# Patient Record
Sex: Male | Born: 1945 | Race: White | Hispanic: No | Marital: Married | Smoking: Current every day smoker
Health system: Southern US, Community
[De-identification: ages and names within clinical notes are randomized; demographics above are authoritative.]

## PROBLEM LIST (undated history)

## (undated) DIAGNOSIS — Z8719 Personal history of other diseases of the digestive system: Secondary | ICD-10-CM

## (undated) DIAGNOSIS — I2510 Atherosclerotic heart disease of native coronary artery without angina pectoris: Secondary | ICD-10-CM

## (undated) DIAGNOSIS — J42 Unspecified chronic bronchitis: Secondary | ICD-10-CM

## (undated) DIAGNOSIS — E785 Hyperlipidemia, unspecified: Secondary | ICD-10-CM

---
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
## 1969-05-26 HISTORY — PX: KNEE ARTHROSCOPY: SUR90

## 1998-06-30 ENCOUNTER — Encounter: Payer: Self-pay | Admitting: Emergency Medicine

## 1998-06-30 ENCOUNTER — Emergency Department (HOSPITAL_COMMUNITY): Admission: EM | Admit: 1998-06-30 | Discharge: 1998-06-30 | Payer: Self-pay | Admitting: Emergency Medicine

## 2000-03-09 ENCOUNTER — Ambulatory Visit (HOSPITAL_COMMUNITY): Admission: RE | Admit: 2000-03-09 | Discharge: 2000-03-09 | Payer: Self-pay | Admitting: General Surgery

## 2000-03-09 ENCOUNTER — Encounter: Payer: Self-pay | Admitting: General Surgery

## 2001-06-25 ENCOUNTER — Encounter: Payer: Self-pay | Admitting: General Surgery

## 2001-06-27 ENCOUNTER — Ambulatory Visit (HOSPITAL_COMMUNITY): Admission: RE | Admit: 2001-06-27 | Discharge: 2001-06-27 | Payer: Self-pay | Admitting: General Surgery

## 2001-06-27 ENCOUNTER — Encounter (INDEPENDENT_AMBULATORY_CARE_PROVIDER_SITE_OTHER): Payer: Self-pay | Admitting: Specialist

## 2004-01-28 ENCOUNTER — Encounter: Admission: RE | Admit: 2004-01-28 | Discharge: 2004-01-28 | Payer: Self-pay | Admitting: Family Medicine

## 2004-01-28 ENCOUNTER — Inpatient Hospital Stay (HOSPITAL_COMMUNITY): Admission: AD | Admit: 2004-01-28 | Discharge: 2004-01-30 | Payer: Self-pay | Admitting: Family Medicine

## 2004-11-24 ENCOUNTER — Emergency Department (HOSPITAL_COMMUNITY): Admission: EM | Admit: 2004-11-24 | Discharge: 2004-11-24 | Payer: Self-pay | Admitting: Emergency Medicine

## 2005-03-01 ENCOUNTER — Emergency Department (HOSPITAL_COMMUNITY): Admission: EM | Admit: 2005-03-01 | Discharge: 2005-03-01 | Payer: Self-pay | Admitting: Emergency Medicine

## 2006-03-15 ENCOUNTER — Ambulatory Visit: Payer: Self-pay | Admitting: Gastroenterology

## 2006-04-02 ENCOUNTER — Ambulatory Visit: Payer: Self-pay | Admitting: Family Medicine

## 2006-04-13 ENCOUNTER — Ambulatory Visit: Payer: Self-pay | Admitting: Family Medicine

## 2006-05-27 ENCOUNTER — Emergency Department (HOSPITAL_COMMUNITY): Admission: EM | Admit: 2006-05-27 | Discharge: 2006-05-27 | Payer: Self-pay | Admitting: Emergency Medicine

## 2006-06-07 ENCOUNTER — Ambulatory Visit: Payer: Self-pay | Admitting: Family Medicine

## 2006-06-07 ENCOUNTER — Encounter: Admission: RE | Admit: 2006-06-07 | Discharge: 2006-06-07 | Payer: Self-pay | Admitting: Family Medicine

## 2006-06-25 ENCOUNTER — Ambulatory Visit: Payer: Self-pay | Admitting: Family Medicine

## 2006-06-26 ENCOUNTER — Observation Stay (HOSPITAL_COMMUNITY): Admission: EM | Admit: 2006-06-26 | Discharge: 2006-06-28 | Payer: Self-pay | Admitting: Emergency Medicine

## 2006-06-26 ENCOUNTER — Ambulatory Visit: Payer: Self-pay | Admitting: Internal Medicine

## 2006-06-27 ENCOUNTER — Ambulatory Visit: Payer: Self-pay | Admitting: Internal Medicine

## 2006-07-10 ENCOUNTER — Ambulatory Visit: Payer: Self-pay | Admitting: Internal Medicine

## 2006-07-30 ENCOUNTER — Ambulatory Visit: Payer: Self-pay | Admitting: Family Medicine

## 2006-07-30 LAB — CONVERTED CEMR LAB
Chol/HDL Ratio, serum: 4.4
Cholesterol: 214 mg/dL (ref 0–200)
HDL: 48.9 mg/dL (ref >39.0)
LDL DIRECT: 150.1 mg/dL
Triglyceride fasting, serum: 55 mg/dL (ref 0–149)
VLDL: 11 mg/dL (ref 0–40)

## 2006-10-20 DIAGNOSIS — R030 Elevated blood-pressure reading, without diagnosis of hypertension: Secondary | ICD-10-CM

## 2006-10-20 DIAGNOSIS — IMO0001 Reserved for inherently not codable concepts without codable children: Secondary | ICD-10-CM | POA: Insufficient documentation

## 2007-06-29 ENCOUNTER — Emergency Department (HOSPITAL_COMMUNITY): Admission: EM | Admit: 2007-06-29 | Discharge: 2007-06-29 | Payer: Self-pay | Admitting: Emergency Medicine

## 2009-02-01 ENCOUNTER — Ambulatory Visit: Payer: Self-pay | Admitting: Family Medicine

## 2009-02-02 LAB — CONVERTED CEMR LAB: PSA: 0.75 ng/mL (ref 0.10–4.00)

## 2009-02-03 ENCOUNTER — Encounter (INDEPENDENT_AMBULATORY_CARE_PROVIDER_SITE_OTHER): Payer: Self-pay | Admitting: *Deleted

## 2009-02-03 LAB — CONVERTED CEMR LAB
ALT: 16 units/L (ref 0–53)
AST: 18 units/L (ref 0–37)
Albumin: 3.9 g/dL (ref 3.5–5.2)
Alkaline Phosphatase: 79 units/L (ref 39–117)
BUN: 9 mg/dL (ref 6–23)
Basophils Absolute: 0 10*3/uL (ref 0.0–0.1)
Basophils Relative: 0.6 % (ref 0.0–3.0)
Bilirubin, Direct: 0 mg/dL (ref 0.0–0.3)
CO2: 31 meq/L (ref 19–32)
Calcium: 9 mg/dL (ref 8.4–10.5)
Chloride: 107 meq/L (ref 96–112)
Cholesterol: 184 mg/dL (ref 0–200)
Creatinine, Ser: 0.7 mg/dL (ref 0.4–1.5)
Eosinophils Absolute: 0.2 10*3/uL (ref 0.0–0.7)
Eosinophils Relative: 3.4 % (ref 0.0–5.0)
GFR calc non Af Amer: 121.25 mL/min (ref >60)
Glucose, Bld: 107 mg/dL — ABNORMAL HIGH (ref 70–99)
HCT: 47 % (ref 39.0–52.0)
HDL: 44.2 mg/dL (ref >39.00)
Hemoglobin: 16.2 g/dL (ref 13.0–17.0)
LDL Cholesterol: 128 mg/dL — ABNORMAL HIGH (ref 0–99)
Lymphocytes Relative: 28.9 % (ref 12.0–46.0)
Lymphs Abs: 1.4 10*3/uL (ref 0.7–4.0)
MCHC: 34.4 g/dL (ref 30.0–36.0)
MCV: 93.6 fL (ref 78.0–100.0)
Monocytes Absolute: 0.4 10*3/uL (ref 0.1–1.0)
Monocytes Relative: 7.8 % (ref 3.0–12.0)
Neutro Abs: 3 10*3/uL (ref 1.4–7.7)
Neutrophils Relative %: 59.3 % (ref 43.0–77.0)
Platelets: 155 10*3/uL (ref 150.0–400.0)
Potassium: 4.2 meq/L (ref 3.5–5.1)
RBC: 5.03 M/uL (ref 4.22–5.81)
RDW: 12.4 % (ref 11.5–14.6)
Sodium: 142 meq/L (ref 135–145)
TSH: 1.52 microintl units/mL (ref 0.35–5.50)
Total Bilirubin: 0.7 mg/dL (ref 0.3–1.2)
Total CHOL/HDL Ratio: 4
Total Protein: 6.7 g/dL (ref 6.0–8.3)
Triglycerides: 61 mg/dL (ref 0.0–149.0)
VLDL: 12.2 mg/dL (ref 0.0–40.0)
WBC: 5 10*3/uL (ref 4.5–10.5)

## 2009-02-05 ENCOUNTER — Telehealth (INDEPENDENT_AMBULATORY_CARE_PROVIDER_SITE_OTHER): Payer: Self-pay | Admitting: *Deleted

## 2009-02-05 ENCOUNTER — Ambulatory Visit: Payer: Self-pay | Admitting: Family Medicine

## 2009-02-05 LAB — CONVERTED CEMR LAB
OCCULT 1: NEGATIVE
OCCULT 2: NEGATIVE
OCCULT 3: POSITIVE

## 2009-02-23 ENCOUNTER — Ambulatory Visit: Payer: Self-pay | Admitting: Family Medicine

## 2009-02-23 ENCOUNTER — Encounter (INDEPENDENT_AMBULATORY_CARE_PROVIDER_SITE_OTHER): Payer: Self-pay | Admitting: *Deleted

## 2009-02-23 LAB — CONVERTED CEMR LAB
OCCULT 1: NEGATIVE
OCCULT 2: NEGATIVE
OCCULT 3: NEGATIVE

## 2009-07-27 ENCOUNTER — Telehealth (INDEPENDENT_AMBULATORY_CARE_PROVIDER_SITE_OTHER): Payer: Self-pay | Admitting: *Deleted

## 2009-10-26 ENCOUNTER — Ambulatory Visit: Payer: Self-pay | Admitting: Family Medicine

## 2009-10-26 DIAGNOSIS — Z87898 Personal history of other specified conditions: Secondary | ICD-10-CM | POA: Insufficient documentation

## 2009-10-26 DIAGNOSIS — N3941 Urge incontinence: Secondary | ICD-10-CM | POA: Insufficient documentation

## 2009-10-26 DIAGNOSIS — F528 Other sexual dysfunction not due to a substance or known physiological condition: Secondary | ICD-10-CM | POA: Insufficient documentation

## 2009-10-26 LAB — CONVERTED CEMR LAB
Bilirubin Urine: NEGATIVE
Blood in Urine, dipstick: NEGATIVE
Glucose, Urine, Semiquant: NEGATIVE
Ketones, urine, test strip: NEGATIVE
Nitrite: NEGATIVE
Protein, U semiquant: NEGATIVE
Specific Gravity, Urine: 1.015
Urobilinogen, UA: 0.2
WBC Urine, dipstick: NEGATIVE
pH: 6

## 2009-10-28 LAB — CONVERTED CEMR LAB
PSA: 1.87 ng/mL (ref 0.10–4.00)
Sex Hormone Binding: 51 nmol/L (ref 13–71)
Testosterone Free: 53.8 pg/mL (ref 47.0–244.0)
Testosterone-% Free: 1.5 % — ABNORMAL LOW (ref 1.6–2.9)
Testosterone: 357.74 ng/dL (ref 350–890)

## 2010-10-25 NOTE — Progress Notes (Signed)
Summary: DO NOT RELEASE RECORDS TO [facility]  Phone Note Call from Patient Call back at Home Phone [phone]   Caller: Patient Summary of Call: Pt called to say  he DOES NOT want his records sent to [facility], he wants to stay with [place]. He says he does not think he signed, but if they ask for records do not send. I informed pt if he did not sign records we cannot release. Initial call taken by: Kandice Hams,  July 27, 2009 10:08 AM

## 2010-10-25 NOTE — Assessment & Plan Note (Signed)
Summary: CPX & LAB.[facility]   Vital Signs:  Patient profile:   65 year old male Height:      71.25 inches Weight:      207.8 pounds BMI:     28.88 Pulse rate:   72 / minute BP sitting:   152 / 90  (right arm)  Vitals Entered By: Doristine Devoid (Feb 01, 2009 8:33 AM) CC: CPX AND LABS    History of Present Illness: 64 yo man here today for CPE.  hasn't been seen since 10/07.  pt w/out concerns.  1) HTN- BP elevated, hx of elevation.  not on meds.  had DOT physical and reports there were no limitations.  never on meds.  pt reports BP elevates when he's upset about his mom.  was prescribed meds in past but only took 1 pill- never took it again, developed severe allergic rxn to ACE/ARB.  2) Pulm nodule- found on CT in 10/07, pt refused followup according to chart but pt reports nodule 'cleared'.  3) Health Maintainence- colonoscopy (refused).    Preventive Screening-Counseling & Management     Alcohol drinks/day: 0     Smoking Status: current     Smoking Cessation Counseling: yes     Smoke Cessation Stage: contemplative     Packs/Day: 1.5     Year Started: 1963     Does Patient Exercise: yes     Type of exercise: biking, walking     Exercise (avg: min/session): <30     Times/week: 4      Sexual History:  currently monogamous.        Drug Use:  never.    Allergies (verified): 1)  ! * Lisinopril  Past History:  Past Medical History:    Hypertension    Pulmonary Nodule RLL-refused follow up    BPH  Past Surgical History:    Vascular surgery Right hand    Right knee surgery   Family History:    CAD-father MI, mother    HTN-father,mother    DM- 2 brothers,mother          Social History:    pt drives for [employer]    married, 1 son (lives locally)    Smoking Status:  current    Packs/Day:  1.5    Does Patient Exercise:  yes    Sexual History:  currently monogamous    Drug Use:  never  Review of Systems  The patient denies anorexia, fever, weight  loss, weight gain, vision loss, decreased hearing, hoarseness, chest pain, syncope, dyspnea on exertion, peripheral edema, prolonged cough, headaches, abdominal pain, melena, hematochezia, hematuria, suspicious skin lesions, depression, abnormal bleeding, enlarged lymph nodes, and testicular masses.    Physical Exam  General:  Well-developed,well-nourished,in no acute distress; alert,appropriate and cooperative throughout examination.  smells of cigarettes Head:  Normocephalic and atraumatic without obvious abnormalities. No apparent alopecia or balding. Eyes:  No corneal or conjunctival inflammation noted. EOMI. Perrla. Funduscopic exam benign, without hemorrhages, exudates or papilledema. Vision grossly normal. Ears:  External ear exam shows no significant lesions or deformities.  Otoscopic examination reveals clear canals, tympanic membranes are intact bilaterally without bulging, retraction, inflammation or discharge. Hearing is grossly normal bilaterally. Nose:  External nasal examination shows no deformity or inflammation. Nasal mucosa are pink and moist without lesions or exudates. Mouth:  Oral mucosa and oropharynx without lesions or exudates.  Teeth in good repair. Neck:  No deformities, masses, or tenderness noted. Lungs:  Normal respiratory effort, chest  expands symmetrically. Lungs are clear to auscultation, no crackles or wheezes. Heart:  Normal rate and regular rhythm. S1 and S2 normal without gallop, murmur, click, rub or other extra sounds. Abdomen:  Bowel sounds positive,abdomen soft and non-tender without masses, organomegaly or hernias noted. Rectal:  No external abnormalities noted. Normal sphincter tone. No rectal masses or tenderness. Genitalia:  Testes bilaterally descended without nodularity, tenderness or masses. No scrotal masses or lesions. No penis lesions or urethral discharge. Prostate:  Prostate gland firm and smooth, mild enlargement, no nodularity, tenderness, mass,  asymmetry or induration. Msk:  No deformity or scoliosis noted of thoracic or lumbar spine.   Pulses:  +2 carotid, radial, DP Extremities:  No clubbing, cyanosis, edema, or deformity noted with normal full range of motion of all joints.   Neurologic:  No cranial nerve deficits noted. Station and gait are normal. Plantar reflexes are down-going bilaterally. DTRs are symmetrical throughout. Sensory, motor and coordinative functions appear intact. Skin:  Intact without suspicious lesions or rashes Cervical Nodes:  No lymphadenopathy noted Axillary Nodes:  No palpable lymphadenopathy Psych:  pt w/ a lot of trouble answering questions or making decisions based on cognitive limitations  (8th grade education)   Impression & Recommendations:  Problem # 1:  HEALTHY ADULT MALE (ICD-V70.0) Assessment New pt's PE WNL.  labs as below.  discussed colonoscopy- pt refused.  will do stool cards.  EKG for baseline.  stressed importance of smoking cessation- pt thinking about it. Orders: Venipuncture (29562) TLB-Lipid Panel (80061-LIPID) TLB-BMP (Basic Metabolic Panel-BMET) (80048-METABOL) TLB-CBC Platelet - w/Differential (85025-CBCD) TLB-Hepatic/Liver Function Pnl (80076-HEPATIC) TLB-TSH (Thyroid Stimulating Hormone) (84443-TSH) EKG w/ Interpretation (93000) TLB-PSA (Prostate Specific Antigen) (84153-PSA)  Problem # 2:  HYPERTENSION (ICD-401.9) Assessment: Unchanged BP elevated but reports this is b/c he gets upset when he thinks about his mother and yesterday was mother's day.  doubt this is the only time pt's BP elevated.  pt to check BP outside of office when he is not upset and record these #s.  pt to call if persistantly elevated b/c pt likely needs medication.  refuses at this time.  will follow.  Patient Instructions: 1)  Please schedule a follow-up appointment in 6 months to recheck blood pressure. 2)  Check your blood pressure at home, the grocery store, pharmacy, etc at least once a week.   record these numbers.  if they are usually higher than 140/90- please call 3)  We will notify you of your labs 4)  Call with any questions or concerns 5)  STOP SMOKING! 6)  Have a great summer!   Tetanus/Td Immunization History:    Tetanus/Td # 1:  Historical (04/02/2006)

## 2010-10-25 NOTE — Assessment & Plan Note (Signed)
Summary: PT WANTS PROSTATE CHECKED/RH.........Jacob Ruiz   Vital Signs:  Patient profile:   65 year old male Weight:      210 pounds Temp:     97.6 degrees F oral Pulse rate:   80 / minute Pulse rhythm:   regular BP sitting:   122 / 80  (left arm) Cuff size:   regular  Vitals Entered By: Army Fossa CMA (October 26, 2009 2:57 PM) CC: Pt would like his prostate checked- only concern is sometimes he is unable to hold urination.    History of Present Illness: Pt here c/o urinary frequency, urgency and some incontinence.   He also c/o of some ED.  No other complaints.    Current Medications (verified): 1)  Flomax 0.4 Mg Caps (Tamsulosin Hcl) .Jacob Ruiz.. 1 By Mouth Once Daily 2)  Levitra 20 Mg Tabs (Vardenafil Hcl) .... As Directed  Allergies: 1)  ! * Lisinopril  Past History:  Past medical, surgical, family and social histories (including risk factors) reviewed for relevance to current acute and chronic problems.  Past Medical History: Reviewed history from 02/01/2009 and no changes required. Hypertension Pulmonary Nodule RLL-refused follow up BPH  Past Surgical History: Reviewed history from 02/01/2009 and no changes required. Vascular surgery Right hand Right knee surgery   Family History: Reviewed history from 02/01/2009 and no changes required. CAD-father MI, mother HTN-father,mother DM- 2 brothers,mother  Social History: Reviewed history from 02/01/2009 and no changes required. 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 Prostate:  no nodules, no asymmetry, no induration, and 1+ enlarged.   Cervical Nodes:  No lymphadenopathy noted Psych:  Cognition and judgment appear  intact. Alert and cooperative with normal attention span and concentration. No apparent delusions, illusions, hallucinations   Impression & Recommendations:  Problem # 1:  BENIGN PROSTATIC HYPERTROPHY, HX OF (ICD-V13.8) flomax  check psa  Problem # 2:  URGE INCONTINENCE (ICD-788.31)  Orders: Venipuncture (95621) TLB-PSA (Prostate Specific Antigen) (84153-PSA)  Complete Medication List: 1)  Flomax 0.4 Mg Caps (Tamsulosin hcl) .Jacob Ruiz.. 1 by mouth once daily 2)  Levitra 20 Mg Tabs (Vardenafil hcl) .... As directed  Other Orders: T- * Misc. Laboratory test [id number]) Prescriptions: FLOMAX 0.4 MG CAPS (TAMSULOSIN HCL) 1 by mouth once daily  #30 x 5   Entered and Authorized by:   Loreen Freud DO   Signed by:   Loreen Freud DO on 10/26/2009   Method used:   Print then Give to Patient   RxID:   [id number]   Laboratory Results   Urine Tests    Routine Urinalysis   Color: yellow Appearance: Clear Glucose: negative   (Normal Range: Negative) Bilirubin: negative   (Normal Range: Negative) Ketone: negative   (Normal Range: Negative) Spec. Gravity: 1.015   (Normal Range: 1.003-1.035) Blood: negative   (Normal Range: Negative) pH: 6.0   (Normal Range: 5.0-8.0) Protein: negative   (Normal Range: Negative) Urobilinogen: 0.2   (Normal Range: 0-1) Nitrite: negative   (Normal Range: Negative) Leukocyte Esterace: negative   (Normal Range: Negative)    Comments: Army Fossa CMA  October 26, 2009 3:03 PM

## 2010-10-25 NOTE — Letter (Signed)
Summary: Results Follow up Letter  [facility] [place]  [address] [place], [place] [address]   Phone: [phone]  Fax: [phone]    02/03/2009 MRN: [id number]  Kache Dittmar [address] [place], [place]  [address]  Dear Mr. Yuille,  The following are the results of your recent test(s):  Test         Result    Pap Smear:        Normal _____  Not Normal _____ Comments: ______________________________________________________ Cholesterol: LDL(Bad cholesterol):         Your goal is less than:         HDL (Good cholesterol):       Your goal is more than: Comments:  ______________________________________________________ Mammogram:        Normal _____  Not Normal _____ Comments:  ___________________________________________________________________ Hemoccult:        Normal _____  Not normal _______ Comments:    _____________________________________________________________________ Other Tests: PLEASE SEE COPY OF LABS FROM 02/01/09- labs WNL- pt should continue to focus on diet and exercise to keep cholesterol at goal, normal- rescreen in 1 yr      We routinely do not discuss normal results over the telephone.  If you desire a copy of the results, or you have any questions about this information we can discuss them at your next office visit.   Sincerely,

## 2010-10-25 NOTE — Progress Notes (Signed)
Summary: stool cards  Phone Note Outgoing Call Call back at [phone]   Call placed by: Doristine Devoid,  Feb 05, 2009 4:50 PM Call placed to: Patient Summary of Call: spoke with patient informed that stool cards positve says he ate something that "messed his stomach up"  and wanted to redo cards. Per Dr. Beverely Low ok for patient to redo stool cards informed he is to have nothing to eat or drink thats red or any red meats but let patient know that if stool cards positive again he will be sent to GI doc patient agreed.............Marland KitchenDoristine Devoid  Feb 05, 2009 4:52 PM

## 2010-10-25 NOTE — Letter (Signed)
Summary: Proof of Physical Letter  Proof of Physical Letter   Imported By: Lanelle Bal 02/03/2009 15:02:45  _____________________________________________________________________  External Attachment:    Type:   Image     Comment:   External Document

## 2010-10-25 NOTE — Letter (Signed)
Summary: Results Follow up Letter  [facility] [place]  [address] [place], [place] [address]   Phone: [phone]  Fax: [phone]    02/23/2009 MRN: [id number]  Jacob Ruiz [address] [place], [place]  [address]  Dear Mr. Petro,  The following are the results of your recent test(s):  Test         Result    Pap Smear:        Normal _____  Not Normal _____ Comments: ______________________________________________________ Cholesterol: LDL(Bad cholesterol):         Your goal is less than:         HDL (Good cholesterol):       Your goal is more than: Comments:  ______________________________________________________ Mammogram:        Normal _____  Not Normal _____ Comments:  ___________________________________________________________________ Hemoccult: STOOL CARDS NEG.  Normal __X___  Not normal _______ Comments:    _____________________________________________________________________ Other Tests:    We routinely do not discuss normal results over the telephone.  If you desire a copy of the results, or you have any questions about this information we can discuss them at your next office visit.   Sincerely,

## 2011-02-10 NOTE — H&P (Signed)
NAMEBERND, Jacob Ruiz               ACCOUNT NO.:  [id number]   MEDICAL RECORD NO.:  [id number]          PATIENT TYPE:  INP   LOCATION:  4705                         FACILITY:  [facility]   PHYSICIAN:  Willow Ora, MD           DATE OF BIRTH:  04-05-1946   DATE OF ADMISSION:  06/26/2006  DATE OF DISCHARGE:                                HISTORY & PHYSICAL   CHIEF COMPLAINT:  Allergic reaction.   HISTORY OF PRESENT ILLNESS:  Mr. Pineo is a 65 year old white male who was  seen by his primary care doctor, Dr. Blossom Hoops, and he noticed that his  blood pressure was moderately elevated and he prescribed lisinopril 10 mg 1  p.o. daily.  The patient took his first dose last night at around 7 p.m.  Shortly after, he started to feel unwell but had no specific symptoms.  This  morning he woke up.  He was red all over.  He noticed swelling in his hands,  face, and feet but not at his tongue.  He felt short of breath and he also  had some difficulty breathing.  At the office, he was diagnosed with a  severe allergic reaction to lisinopril.  He has been admitted for further  care.  Before he left the hospital, he got Solu-Medrol 125 mg IM x1 after we  noticed his blood sugar to be 90.   PAST MEDICAL HISTORY:  1. The patient is a heavy smoker and apparently has some degree of      emphysema.  2. Moderate elevation of the blood pressure.   FAMILY HISTORY:  Positive for diabetes, CHF, colon cancer, and coronary  artery disease.   SOCIAL HISTORY:  The patient is married.  He smokes heavily.   REVIEW OF SYSTEMS:  Denies any fevers.  He did complain of chest pain that  feels like needles in the chest on and off for a few seconds.  Also he said  that his chest is tight like he has a vise on the chest.  He admits to  some cough but he had that before he started the ACE inhibitors.  He has  some nausea but no vomiting or diarrhea.  He does have a headache.   MEDICATIONS:  None on a routine basis.   ALLERGIES:  LISINOPRIL as described above.   PHYSICAL EXAMINATION:  GENERAL:  The patient is alert and oriented, in no  apparent distress.  LUNGS:  He has a few wheezes bilaterally but no increased work of breathing.  CARDIOVASCULAR:  Regular rate and rhythm without a murmur.  EXTREMITIES:  There is no pitting edema at the pretibial areas but the hands  definitely feel tight to touch.  SKIN:  He has generalized erythema more noticeable at the back.  Lips and  tongue:  Normal.   ASSESSMENT AND PLAN:  The patient is admitted to the hospital with a severe  LISINOPRIL reaction.  He will be monitored closely.  I will start him on  Solu-Medrol IV, Pepcid IV, and give him at least the first dose of  Benadryl  IV.  He is aware that if the symptoms get worse, he should alert the nurse  immediately.  I also wrote down from him the list of allergies that include  LISINOPRIL, ACE INHIBITORS, AND ARBs.      Willow Ora, MD  Electronically Signed     JP/MEDQ  D:  06/26/2006  T:  06/27/2006  Job:  [id number]

## 2011-02-10 NOTE — Discharge Summary (Signed)
NAMEROWYN, SPILDE               ACCOUNT NO.:  [id number]   MEDICAL RECORD NO.:  [id number]          PATIENT TYPE:  INP   LOCATION:  4705                         FACILITY:  [facility]   PHYSICIAN:  Jacob Ruiz, MDDATE OF BIRTH:  Mar 28, 1946   DATE OF ADMISSION:  06/26/2006  DATE OF DISCHARGE:  06/28/2006                                 DISCHARGE SUMMARY   DISCHARGE DIAGNOSES:  1. Anaphylaxis to ACE inhibitors.  2. Hypertension.  3. History of chronic obstructive pulmonary disease and tobacco abuse.  4. Rule our sinusitis.   HISTORY OF PRESENT ILLNESS:  [name] is a 65 year old white male who was  placed on lisinopril for the first time on the evening prior to admission.  He noted that he did not feel well, but had no specific complaints.  On the  morning of admission, he noticed shortness of breath, as well as redness and  swelling in his hands and face, as well as some shortness of breath.  He was  admitted for further evaluation and treatment.   COURSE OF HOSPITALIZATION:  1. Anaphylaxis to ACE inhibitor.  The patient was admitted and was placed      on IV steroids, as well as Pepcid and Benadryl.  His symptoms continued      to improve.  He is stable at time of discharge.  He will be sent home      on a prednisone taper, as well as p.o. Pepcid and p.r.n. Benadryl.  I      have reviewed, with the patient, the prednisone taper and reinforced      importance of completing medications as ordered.  However, patient      seems upset with need for home prescriptions and I am not sure that the      patient will be compliant; however, he has been educated on the      importance of taking these medications.  2. Hypertension.  The patient's blood pressure, at the time of discharge,      is 141/62 and he is currently off of all antihypertensives.  We will      defer further treatment to his primary care.  3. COPD.  The patient is currently at his baseline.  He is urged to quit    smoking.  4. Complaint of headache and sinus congestion.  Questionable sinusitis.      He will be treated with empiric Z-Pak.  5. The patient was maintained on telemetry during this admission.  He was      noted to have some bradycardia down into the 40s while sleeping, which      was asymptomatic and patient returned to heart rate in the 70s upon      waking.  He denies any history of syncope or dizziness.  We will defer      any further workup at this time.   CONDITION AT TIME OF DISCHARGE:  Medically improved.   FOLLOWUP:  The patient is instructed to follow up with Dr. Blossom Hoops, his  primary care [name] in 1 week and to contact their office  for an  appointment.  He is also instructed to return to the emergency room should  he develop swelling or shortness of breath.   MEDICATIONS AT DISCHARGE:  1. Z-Pak take as directed.  2. Prednisone taper 60 mg p.o. on October 4, 50 mg p.o. on October 5, 40      mg p.o. on October 6, 30 mg p.o. on October 7, 20 mg p.o. on October 8,      10 mg p.o. on October 9 and then discontinue.  3. Pepcid 20 mg p.o. b.i.d.  4. Benadryl 25 mg p.o. q.6 hours as needed.   DISPOSITION/PLAN:  Transfer patient to home.   FOLLOWUP:  The patient instructed to follow up with Dr. Blossom Hoops in 1 week.     ______________________________  Sandford Craze, NP      Raenette Rover. Felicity Coyer, MD  Electronically Signed    MO/MEDQ  D:  06/28/2006  T:  06/28/2006  Job:  [id number]   cc:   Leanne Chang, M.D.

## 2011-02-10 NOTE — Assessment & Plan Note (Signed)
[facility]                                   ON-CALL NOTE   NAME:Jacob Ruiz, Jacob Ruiz                        MRN:          [id number]  DATE:05/27/2006                            DOB:          February 25, 1946    OUTPATIENT ON CALL NOTE:  Date of interaction May 27, 2006 at 12:27  p.m.  Phone number is [phone].   OBJECTIVE:  Patient is light headed and had a complete physical July 8 and  was given an appointment in three months to check his blood pressure.  Yesterday he was light headed as he is today. Blood pressure today was  checked and was 180/84. He is not sure what his blood pressure was  yesterday.  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  History is  significant only for having a tooth pulled with wisdom teeth recently.  Was  on antibiotics for that.  Currently he is slightly light headed but feels  better than he does this morning.   ASSESSMENT:  Hypertension.  Presumably elevated and out-of-control.  Untreated with Lotensin as in the plan.  To the ER to be evaluated.   PRIMARY CARE Jacob Ruiz:  Dr[facility] Blossom [facility]ops at the [facility].                                   Arta Silence, MD   RNS/MedQ  DD:  05/27/2006  DT:  05/28/2006  Job #:  [id number]   cc:   Leanne Chang, M.D.

## 2011-02-10 NOTE — H&P (Signed)
NAME:  Jacob Ruiz, Jacob Ruiz                         ACCOUNT NO.:  [id number]   MEDICAL RECORD NO.:  [id number]                   PATIENT TYPE:  INP   LOCATION:  6736                                 FACILITY:  [facility]   PHYSICIAN:  Hollice Espy, M.D.            DATE OF BIRTH:  1946/03/20   DATE OF ADMISSION:  01/28/2004  DATE OF DISCHARGE:                                HISTORY & PHYSICAL   CHIEF COMPLAINT:  Shortness of breath.   HISTORY OF PRESENT ILLNESS:  This is a 65 year old white male with  essentially no past medical history other the knee surgery, who presents  with shortness of breath, pneumonia, and possible lung mass. The patient has  been relatively well with no complaints. He takes relatively no mediations  but over the past 5 days, has noticed increased problems with taking p.o.  He noted that anytime he would take food, that he would feel very nauseous  and usually throw it back up. If he tried liquids, he would be able to keep  that down but had lots of diarrhea. In addition, he noticed increasing  shortness of breath with intermittent productive cough, usually varying in  color from green to yellow with occasional hemoptysis. The patient felt  increasing shortness of breath with dyspnea on exertion. He complained of  occasional chest pain but was unsure if the chest pain, which usually  occurred after he ate food and felt like he had to throw it back up. The  patient otherwise had as an outpatient, tried p.r.n. Phenergan as well as  Zithromax with no evidence of improvement. Finally his primary care  physician, Dr. Blossom Hoops, felt like patient. The patient had a chest x-ray  done today, which showed evidence of a lingular pneumonia as well as  questionable bilateral basilar pneumonias. After his chest x-ray showed  evidence of a lingular pneumonia and also showed evidence of bilateral  basilar pneumonias versus possible lung mass, Dr. Blossom Hoops became concerned  and  had patient admitted as a direct admission for further work and  evaluation. Currently the patient states that he is feeling okay but he  feels quite thirsty and dehydrated. He has no current abdominal pain or  chest pain but he does report the history in the last few days. He has felt  slightly shortness of breath although better when he is resting. He denies  any hematuria or dysuria. He denies any constipation. The patient denies any  headaches or visual changes. He is a little anxious about the possible lung  mass. He denies any focal extremity pain or weakness although overall, he  does feel quite fatigued. He has never had anything like this before.   PAST MEDICAL HISTORY:  Bilateral knee surgeries.   MEDICATIONS:  The patient does not take any medications although recently  has taken a course of Zithromax and Phenergan.   ALLERGIES:  No known drug allergies.  SOCIAL HISTORY:  He is a greater than 40 to 50 pack year smoker. He quit  last Friday. He denies any heavy alcohol or drug use.   FAMILY HISTORY:  Extensive for diabetes, coronary artery disease,  hypertension.   PHYSICAL EXAMINATION:  VITAL SIGNS:  Currently not present although  according to his primary care physician, he was sating 94% to 95% on room  air. He is not tachycardiac or febrile.  GENERAL:  Alert and oriented. No apparent distress.  HEENT:  Normocephalic, atraumatic. He has no carotid bruits. His mucous  membranes are dry.  HEART:  Regular rate and rhythm. S1 and S2.  LUNGS:  He has decreased breath sounds at his bases.  ABDOMEN:  Soft, nontender, nondistended. He has hypoactive bowel sounds.  EXTREMITIES:  No clubbing, cyanosis, or edema. He has 1+ pulses.  NEUROLOGIC:  No focal neurological deficits.   LABORATORY DATA:  White count 4.6. Hemoglobin and hematocrit 14.7 and 42.  Platelet count 152,000. MCV of 88. He has 59% neutrophils, which is normal.  Of note, he has 13% monocytes which is slightly  elevated. His CPK, MB, chem  7 and LFT's are all pending. ABG shows pH of 7.47, pCO2 of 37, pO2 of only  75.8, bicarb of 27. This is on room air.   2-view of his chest shows a lingular pneumonia, masses or areas of  __________ pneumonia in both lungs and mild changes of COPD and chronic  bronchitis.   ASSESSMENT/PLAN:  1. Pneumonia with at least involvement of the lingula and possibly bilateral     bases. Will go ahead and give him oxygen and IV antibiotics for now. Will     continue to follow this.  2. Lung mass. Will be checking a CT of the chest. The patient is noted to     have a long time smoking history.  3. Diarrhea. I question the cause. This may be a gastroenteritis. Appears to     be too soon to be in response to antibiotics and the time frame is that     the diarrhea came first before the patient started antibiotics. Will go     ahead and give him p.r.n. Imodium for now for treatment.  4. Decreased p.o. with nausea and vomiting. Followup with CT of his chest     and abdominal x-ray to ensure that there is no etiology going on there.     Start him on clear liquids. If this seems to persist and is unrelated to     his lung problems, will ask GI to see him for possible EGD evaluation.                                                Hollice Espy, M.D.    SKK/MEDQ  D:  01/28/2004  T:  01/29/2004  Job:  [id number]   cc:   Leanne Chang, M.D.  [address]  [place]  [place] [address]  Fax: [phone]

## 2011-02-10 NOTE — Op Note (Signed)
[facility]  Patient:    Jacob Ruiz, Jacob Ruiz Visit Number: [id number] MRN: [id number]          Service Type: DSU Location: DAY Attending Physician:  Carson Myrtle Proc. Date: 06/27/01 Admit Date:  06/27/2001                             Operative Report  PREOPERATIVE DIAGNOSIS:  Recurrent perineal fistula.  POSTOPERATIVE DIAGNOSIS:  Recurrent perineal fistula.  OPERATIVE PROCEDURE:  Excision of fistulous tract.  SURGEON:  Timothy E. Earlene Plater, M.D.  ANESTHESIA:  General.  INDICATIONS:  Mr. 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  This has never been connected with the anus or rectum.  He has been seen and followed and treated in the office.  In spite of this, he has developed a recurrent fistula at the base of the scrotum in the midline of the perineum.  He wishes to have this repaired surgically.  It has been carefully discussed with the patient.  DESCRIPTION OF PROCEDURE:  The patient was taken to the operating room and placed supine.  General endotracheal anesthesia administered.  He was placed in lithotomy.  The perineum was prepped and draped and the scrotum elevated. The fistula began at the base of the scrotum in the midline and proceeded approximately 2.5 cm through the midline of the perineum and exited through a skin pore that was pustular.  This entire area was completely excised over a malleable probe.  The remaining subcutaneous tissue appeared normal.  The gaping wound by intention was approximately 2 cm wide and 2.5 cm long.  Its base was cauterized.  It was dry.  There were no complications.  A dressing was applied.  He tolerated it well, was awakened, and taken to the recovery room in good condition.  Written and verbal instructions were given including Percocet, and he will be followed as an outpatient. Attending Physician:  Carson Myrtle DD:  06/27/01 TD:  06/27/01 Job:  [id number]

## 2012-08-13 ENCOUNTER — Encounter (HOSPITAL_COMMUNITY): Payer: Self-pay | Admitting: *Deleted

## 2012-08-13 ENCOUNTER — Emergency Department (HOSPITAL_COMMUNITY)
Admission: EM | Admit: 2012-08-13 | Discharge: 2012-08-13 | Disposition: A | Discharge status: Home / Self Care | Payer: Medicare Other | Source: Home / Self Care | Attending: Emergency Medicine | Admitting: Emergency Medicine

## 2012-08-13 ENCOUNTER — Emergency Department (INDEPENDENT_AMBULATORY_CARE_PROVIDER_SITE_OTHER): Payer: Medicare Other

## 2012-08-13 DIAGNOSIS — R0789 Other chest pain: Secondary | ICD-10-CM

## 2012-08-13 DIAGNOSIS — R071 Chest pain on breathing: Secondary | ICD-10-CM

## 2012-08-13 MED ORDER — DICLOFENAC SODIUM 75 MG PO TBEC: 75.0000 mg | DELAYED_RELEASE_TABLET | Freq: Two times a day (BID) | ORAL | Status: DC

## 2012-08-13 MED ORDER — TRAMADOL HCL 50 MG PO TABS: 100.0000 mg | ORAL_TABLET | Freq: Three times a day (TID) | ORAL | Status: DC | PRN

## 2012-08-13 NOTE — ED Provider Notes (Signed)
Chief Complaint  Patient presents with  . Chest Pain    History of Present Illness:   The patient is a 66 year old male who was wrestling with his nephew about 7 weeks ago when he was elbowed in the right chest. He had some pain initially in the left anterolateral chest area, this gradually went away. Last night he thinks he may as left on his arm the wrong way and ever since he woke up this morning he's had more severe pain in that area, the anterolateral right chest. It hurts going over bumps, to touch, to cough, or to take a deep breath. Also reversed blistered bend. He denies any fever, chills, shortness of breath, or hemoptysis. He's had no anterior chest pain, palpitations, syncope, nausea, or diaphoresis. He denies any abdominal pain, nausea, or vomiting.  Review of Systems:  Other than noted above, the patient denies any of the following symptoms. Systemic:  No fever, chills, sweats, or fatigue. ENT:  No nasal congestion, rhinorrhea, or sore throat. Pulmonary:  No cough, wheezing, shortness of breath, sputum production, hemoptysis. Cardiac:  No palpitations, rapid heartbeat, dizziness, presyncope or syncope. GI:  No abdominal pain, heartburn, nausea, or vomiting. Ext:  No leg pain or swelling.  PMFSH:  Past medical history, family history, social history, meds, and allergies were reviewed and updated as needed.   Physical Exam:   Vital signs:  BP 166/72  Pulse 56  Temp 97.9 F (36.6 C) (Oral)  Resp 16  SpO2 99% Gen:  Alert, oriented, in no distress, skin warm and dry. Eye:  PERRL, lids and conjunctivas normal.  Sclera non-icteric. ENT:  Mucous membranes moist, pharynx clear. Neck:  Supple, no adenopathy or tenderness.  No JVD. Lungs:  Clear to auscultation, no wheezes, rales or rhonchi.  No respiratory distress. Heart:  Regular rhythm.  No gallops, murmers, clicks or rubs. Chest:  There is moderate chest wall tenderness in the right, lower, anterolateral chest area without  swelling, bruising, or deformity. Abdomen:  Soft, nontender, no organomegaly or mass.  Bowel sounds normal.  No pulsatile abdominal mass or bruit. Ext:  No edema.  No calf tenderness and Homann's sign negative.  Pulses full and equal. Skin:  Warm and dry.  No rash.   Radiology:  Dg Ribs Unilateral W/chest Right  08/13/2012  *RADIOLOGY REPORT*  Clinical Data: Chest pain  RIGHT RIBS AND CHEST - 3+ VIEW  Comparison: Chest x-ray 07/31/2011  Findings: COPD with hyperinflation of the lungs.  Negative for pneumonia.  Negative for infiltrate effusion or pneumothorax. Negative for mass lesion.  No evidence of right rib fracture.  IMPRESSION: COPD.  No acute cardiopulmonary disease.  Negative for right rib fracture.   Original Report Authenticated By: Janeece Riggers, M.D.    I reviewed the images independently and personally and concur with the radiologist's findings.  Assessment:  The encounter diagnosis was Chest wall pain.  Plan:   1.  The following meds were prescribed:   New Prescriptions   DICLOFENAC (VOLTAREN) 75 MG EC TABLET    Take 1 tablet (75 mg total) by mouth 2 (two) times daily.   TRAMADOL (ULTRAM) 50 MG TABLET    Take 2 tablets (100 mg total) by mouth every 8 (eight) hours as needed for pain.   2.  The patient was instructed in symptomatic care and handouts were given. 3.  The patient was told to return if becoming worse in any way, if no better in 3 or 4 days, and given some  red flag symptoms that would indicate earlier return.    Reuben Likes, MD 08/13/12 [id number]

## 2012-08-13 NOTE — ED Notes (Signed)
Pt reports he heard a snap on his left lateral rib area after picking up a couch 2 weeks ago.    That pain is better.  He also states he and his grandson were playing around several weeks ago and he has had pain in the lower right anterior rib area since then.  He denies feeling SOB.

## 2013-03-08 ENCOUNTER — Emergency Department (HOSPITAL_COMMUNITY)
Admission: EM | Admit: 2013-03-08 | Discharge: 2013-03-08 | Disposition: A | Discharge status: Home / Self Care | Payer: Medicare Other | Attending: Emergency Medicine | Admitting: Emergency Medicine

## 2013-03-08 ENCOUNTER — Encounter (HOSPITAL_COMMUNITY): Payer: Self-pay | Admitting: Emergency Medicine

## 2013-03-08 DIAGNOSIS — Y990 Civilian activity done for income or pay: Secondary | ICD-10-CM | POA: Insufficient documentation

## 2013-03-08 DIAGNOSIS — Y9289 Other specified places as the place of occurrence of the external cause: Secondary | ICD-10-CM | POA: Insufficient documentation

## 2013-03-08 DIAGNOSIS — IMO0002 Reserved for concepts with insufficient information to code with codable children: Secondary | ICD-10-CM | POA: Insufficient documentation

## 2013-03-08 DIAGNOSIS — Y9389 Activity, other specified: Secondary | ICD-10-CM | POA: Insufficient documentation

## 2013-03-08 DIAGNOSIS — S0191XA Laceration without foreign body of unspecified part of head, initial encounter: Secondary | ICD-10-CM

## 2013-03-08 DIAGNOSIS — Z23 Encounter for immunization: Secondary | ICD-10-CM | POA: Insufficient documentation

## 2013-03-08 DIAGNOSIS — S0100XA Unspecified open wound of scalp, initial encounter: Secondary | ICD-10-CM | POA: Insufficient documentation

## 2013-03-08 DIAGNOSIS — F17200 Nicotine dependence, unspecified, uncomplicated: Secondary | ICD-10-CM | POA: Insufficient documentation

## 2013-03-08 MED ORDER — HYDROCODONE-ACETAMINOPHEN 5-325 MG PO TABS: ORAL_TABLET | ORAL | Status: DC

## 2013-03-08 MED ORDER — ACETAMINOPHEN 325 MG PO TABS
650.0000 mg | ORAL_TABLET | Freq: Once | ORAL | Status: AC
  Administered 2013-03-08: 650 mg via ORAL
  Filled 2013-03-08: qty 2

## 2013-03-08 MED ORDER — TETANUS-DIPHTH-ACELL PERTUSSIS 5-2.5-18.5 LF-MCG/0.5 IM SUSP
0.5000 mL | Freq: Once | INTRAMUSCULAR | Status: AC
  Administered 2013-03-08: 0.5 mL via INTRAMUSCULAR
  Filled 2013-03-08: qty 0.5

## 2013-03-08 NOTE — ED Notes (Signed)
Beck, PA at bedside to repair scalp laceration.

## 2013-03-08 NOTE — ED Notes (Signed)
Pt was working outside and hit head on edge of building. Pt not on blood thinners, did not have LOC. Pt is A&O x4. Pt has approx 1 1/2 lac to top of head. Bleeding controlled. Pt in NAD

## 2013-03-08 NOTE — ED Provider Notes (Signed)
History     CSN: [id number]  Arrival date & time 03/08/13  1230   First MD Initiated Contact with Patient 03/08/13 1248      Chief Complaint  Patient presents with  . Head Laceration    (Consider location/radiation/quality/duration/timing/severity/associated sxs/prior treatment) HPI Comments: 67 y.o. Male with no significant medical hx presents today s/p acute onset head laceration PTA. Pt was working outside, bending, stood up, and banged his head on the sharp edge of the shed roof. Admits moderate pain where he bumped his head. Localized. Dull. Pt denies LOC, visual disturbances, headache, numbness, nausea, vomiting. Bleeding well controlled.   Tetanus unknown.   Patient is a 67 y.o. male presenting with scalp laceration.  Head Laceration Pertinent negatives include no chest pain, diaphoresis, fatigue, fever, headaches, nausea, neck pain, numbness, vertigo, visual change, vomiting or weakness.    History reviewed. No pertinent past medical history.  Past Surgical History  Procedure Laterality Date  . Knee arthroscopy Right     No family history on file.  History  Substance Use Topics  . Smoking status: Current Every Day Smoker -- 2.00 packs/day    Types: Cigarettes  . Smokeless tobacco: Not on file  . Alcohol Use: No      Review of Systems  Constitutional: Negative for fever, diaphoresis and fatigue.  HENT: Negative for neck pain and ear discharge.        Bump, laceration to right parietal  Eyes: Negative for visual disturbance.  Respiratory: Negative for shortness of breath.   Cardiovascular: Negative for chest pain.  Gastrointestinal: Negative for nausea and vomiting.  Musculoskeletal: Negative for gait problem.  Skin: Positive for wound.       1 1/2 inch laceration to right parietal  Neurological: Negative for vertigo, weakness, numbness and headaches.  Psychiatric/Behavioral: Negative for confusion.    Allergies  Lisinopril and Aleve  Home  Medications   Current Outpatient Rx  Name  Route  Sig  Dispense  Refill  . vitamin E 400 UNIT capsule   Oral   Take 400 Units by mouth daily.           BP 157/75  Pulse 56  Temp(Src) 98.2 F (36.8 C) (Oral)  Resp 18  SpO2 93%  Physical Exam  Nursing note and vitals reviewed. Constitutional: He is oriented to person, place, and time. He appears well-developed and well-nourished. No distress.  HENT:  Head: Normocephalic. Head is with contusion and with laceration.    Right Ear: External ear normal.  Left Ear: External ear normal.  Nose: Nose normal.  6 cm laceration to right parietal, minor bump, bleeding controlled  Eyes: Conjunctivae and EOM are normal. Pupils are equal, round, and reactive to light.  Neck: Normal range of motion. Neck supple.  No meningeal signs  Cardiovascular: Normal rate, regular rhythm and normal heart sounds.  Exam reveals no gallop and no friction rub.   No murmur heard. Pulmonary/Chest: Effort normal and breath sounds normal. No respiratory distress. He has no wheezes. He has no rales. He exhibits no tenderness.  Abdominal: Soft. Bowel sounds are normal. He exhibits no distension. There is no tenderness. There is no rebound and no guarding.  Musculoskeletal: Normal range of motion. He exhibits no edema and no tenderness.  FROM to upper and lower extremities No step-offs noted on C-spine No tenderness to palpation of the spinous processes of the C-spine, T-spine or L-spine  Neurological: He is alert and oriented to person, place, and time. No cranial  nerve deficit.  Speech is clear and goal oriented, follows commands Sensation normal to light touch  Moves extremities without ataxia, coordination intact Normal gait and balance Normal strength in upper and lower extremities bilaterally including dorsiflexion and plantar flexion, strong and equal grip strength   Skin: Skin is warm and dry. He is not diaphoretic. No erythema.  Psychiatric: He has a  normal mood and affect.    ED Course  Procedures (including critical care time) LACERATION REPAIR Performed by: Glade Nurse Authorized by: Glade Nurse Consent: Verbal consent obtained. Risks and benefits: risks, benefits and alternatives were discussed Consent given by: patient Patient identity confirmed: provided demographic data Prepped and Draped in normal sterile fashion Wound explored  Laceration Location: right parietal  Laceration Length: 6 cm  No Foreign Bodies seen or palpated  Anesthesia: local infiltration  Local anesthetic: pt refused  Anesthetic total: 0 ml  Irrigation method: syringe, 1000 cc Amount of cleaning: standard  Skin closure: staples  Number of sutures: 3  Technique: staples  Patient tolerance: Patient tolerated the procedure well with no immediate complications.  Labs Reviewed - No data to display No results found.   1. Laceration of head without complication, initial encounter        MDM  Tdap booster given. Wound cleaning complete with pressure irrigation, bottom of wound visualized, no foreign bodies appreciated. Laceration occurred < 8 hours prior to repair which was well tolerated. Pt has no co morbidities to effect normal wound healing. Discussed home care w pt and answered questions. Pt to f-u with PCP for wound check and staple removal in 5 days. Pt is hemodynamically stable w no complaints prior to dc.     Glade Nurse, PA-C 03/08/13 1515

## 2013-03-09 NOTE — ED Provider Notes (Signed)
Medical screening examination/treatment/procedure(s) were performed by non-physician practitioner and as supervising physician I was immediately available for consultation/collaboration.   Gwyneth Sprout, MD 03/09/13 1006

## 2014-03-26 ENCOUNTER — Observation Stay (HOSPITAL_COMMUNITY)
Admission: EM | Admit: 2014-03-26 | Discharge: 2014-03-28 | Disposition: A | Discharge status: Home / Self Care | Payer: Medicare Other | Attending: Emergency Medicine | Admitting: Emergency Medicine

## 2014-03-26 ENCOUNTER — Telehealth (HOSPITAL_BASED_OUTPATIENT_CLINIC_OR_DEPARTMENT_OTHER): Payer: Self-pay | Admitting: Emergency Medicine

## 2014-03-26 ENCOUNTER — Encounter (HOSPITAL_COMMUNITY): Payer: Self-pay | Admitting: Emergency Medicine

## 2014-03-26 ENCOUNTER — Emergency Department (HOSPITAL_COMMUNITY): Payer: Medicare Other

## 2014-03-26 DIAGNOSIS — Z79899 Other long term (current) drug therapy: Secondary | ICD-10-CM | POA: Insufficient documentation

## 2014-03-26 DIAGNOSIS — R001 Bradycardia, unspecified: Secondary | ICD-10-CM | POA: Diagnosis present

## 2014-03-26 DIAGNOSIS — R030 Elevated blood-pressure reading, without diagnosis of hypertension: Secondary | ICD-10-CM

## 2014-03-26 DIAGNOSIS — F17200 Nicotine dependence, unspecified, uncomplicated: Secondary | ICD-10-CM

## 2014-03-26 DIAGNOSIS — F528 Other sexual dysfunction not due to a substance or known physiological condition: Secondary | ICD-10-CM

## 2014-03-26 DIAGNOSIS — Z87898 Personal history of other specified conditions: Secondary | ICD-10-CM | POA: Diagnosis present

## 2014-03-26 DIAGNOSIS — I498 Other specified cardiac arrhythmias: Secondary | ICD-10-CM | POA: Insufficient documentation

## 2014-03-26 DIAGNOSIS — Z720 Tobacco use: Secondary | ICD-10-CM | POA: Diagnosis present

## 2014-03-26 DIAGNOSIS — N3941 Urge incontinence: Secondary | ICD-10-CM

## 2014-03-26 DIAGNOSIS — R42 Dizziness and giddiness: Principal | ICD-10-CM | POA: Diagnosis present

## 2014-03-26 DIAGNOSIS — IMO0001 Reserved for inherently not codable concepts without codable children: Secondary | ICD-10-CM | POA: Diagnosis present

## 2014-03-26 HISTORY — DX: Unspecified chronic bronchitis: J42

## 2014-03-26 HISTORY — DX: Personal history of other diseases of the digestive system: Z87.19

## 2014-03-26 LAB — URINALYSIS, ROUTINE W REFLEX MICROSCOPIC
Bilirubin Urine: NEGATIVE
Glucose, UA: NEGATIVE mg/dL
Hgb urine dipstick: NEGATIVE
Ketones, ur: NEGATIVE mg/dL
Leukocytes, UA: NEGATIVE
Nitrite: NEGATIVE
Protein, ur: NEGATIVE mg/dL
Specific Gravity, Urine: 1.023 (ref 1.005–1.030)
Urobilinogen, UA: 0.2 mg/dL (ref 0.0–1.0)
pH: 8 (ref 5.0–8.0)

## 2014-03-26 LAB — CBC
HCT: 43.9 % (ref 39.0–52.0)
Hemoglobin: 14.7 g/dL (ref 13.0–17.0)
MCH: 31 pg (ref 26.0–34.0)
MCHC: 33.5 g/dL (ref 30.0–36.0)
MCV: 92.6 fL (ref 78.0–100.0)
Platelets: 162 10*3/uL (ref 150–400)
RBC: 4.74 MIL/uL (ref 4.22–5.81)
RDW: 13 % (ref 11.5–15.5)
WBC: 7.5 10*3/uL (ref 4.0–10.5)

## 2014-03-26 LAB — CBC WITH DIFFERENTIAL/PLATELET
Basophils Absolute: 0 10*3/uL (ref 0.0–0.1)
Basophils Relative: 0 % (ref 0–1)
Eosinophils Absolute: 0.1 10*3/uL (ref 0.0–0.7)
Eosinophils Relative: 1 % (ref 0–5)
HCT: 46.5 % (ref 39.0–52.0)
Hemoglobin: 15.9 g/dL (ref 13.0–17.0)
Lymphocytes Relative: 13 % (ref 12–46)
Lymphs Abs: 1.3 10*3/uL (ref 0.7–4.0)
MCH: 31.7 pg (ref 26.0–34.0)
MCHC: 34.2 g/dL (ref 30.0–36.0)
MCV: 92.8 fL (ref 78.0–100.0)
Monocytes Absolute: 0.5 10*3/uL (ref 0.1–1.0)
Monocytes Relative: 5 % (ref 3–12)
Neutro Abs: 8 10*3/uL — ABNORMAL HIGH (ref 1.7–7.7)
Neutrophils Relative %: 81 % — ABNORMAL HIGH (ref 43–77)
Platelets: 164 10*3/uL (ref 150–400)
RBC: 5.01 MIL/uL (ref 4.22–5.81)
RDW: 13 % (ref 11.5–15.5)
WBC: 9.9 10*3/uL (ref 4.0–10.5)

## 2014-03-26 LAB — BASIC METABOLIC PANEL
Anion gap: 16 — ABNORMAL HIGH (ref 5–15)
BUN: 11 mg/dL (ref 6–23)
CO2: 25 mEq/L (ref 19–32)
Calcium: 9.4 mg/dL (ref 8.4–10.5)
Chloride: 100 mEq/L (ref 96–112)
Creatinine, Ser: 0.63 mg/dL (ref 0.50–1.35)
GFR calc Af Amer: >90 mL/min (ref >90)
GFR calc non Af Amer: >90 mL/min (ref >90)
Glucose, Bld: 106 mg/dL — ABNORMAL HIGH (ref 70–99)
Potassium: 4.4 mEq/L (ref 3.7–5.3)
Sodium: 141 mEq/L (ref 137–147)

## 2014-03-26 LAB — RAPID URINE DRUG SCREEN, HOSP PERFORMED
Amphetamines: NOT DETECTED
Barbiturates: NOT DETECTED
Benzodiazepines: NOT DETECTED
Cocaine: NOT DETECTED
Opiates: NOT DETECTED
Tetrahydrocannabinol: NOT DETECTED

## 2014-03-26 LAB — CREATININE, SERUM
Creatinine, Ser: 0.66 mg/dL (ref 0.50–1.35)
GFR calc Af Amer: >90 mL/min (ref >90)
GFR calc non Af Amer: >90 mL/min (ref >90)

## 2014-03-26 LAB — I-STAT CHEM 8, ED
BUN: 11 mg/dL (ref 6–23)
Calcium, Ion: 1.17 mmol/L (ref 1.13–1.30)
Chloride: 103 mEq/L (ref 96–112)
Creatinine, Ser: 0.7 mg/dL (ref 0.50–1.35)
Glucose, Bld: 105 mg/dL — ABNORMAL HIGH (ref 70–99)
HCT: 51 % (ref 39.0–52.0)
Hemoglobin: 17.3 g/dL — ABNORMAL HIGH (ref 13.0–17.0)
Potassium: 4.2 mEq/L (ref 3.7–5.3)
Sodium: 141 mEq/L (ref 137–147)
TCO2: 28 mmol/L (ref 0–100)

## 2014-03-26 LAB — PROTIME-INR
INR: 1.07 (ref 0.00–1.49)
Prothrombin Time: 13.9 seconds (ref 11.6–15.2)

## 2014-03-26 LAB — I-STAT TROPONIN, ED: Troponin i, poc: 0 ng/mL (ref 0.00–0.08)

## 2014-03-26 LAB — APTT: aPTT: 32 seconds (ref 24–37)

## 2014-03-26 LAB — ETHANOL: Alcohol, Ethyl (B): <11 mg/dL (ref 0–11)

## 2014-03-26 LAB — TSH: TSH: 0.819 u[IU]/mL (ref 0.350–4.500)

## 2014-03-26 MED ORDER — ONDANSETRON HCL 4 MG/2ML IJ SOLN: 4.0000 mg | Freq: Four times a day (QID) | INTRAMUSCULAR | Status: DC | PRN

## 2014-03-26 MED ORDER — ENOXAPARIN SODIUM 40 MG/0.4ML ~~LOC~~ SOLN
40.0000 mg | SUBCUTANEOUS | Status: DC
  Administered 2014-03-26 – 2014-03-27 (×2): 40 mg via SUBCUTANEOUS
  Filled 2014-03-26 (×3): qty 0.4

## 2014-03-26 MED ORDER — NICOTINE 21 MG/24HR TD PT24
21.0000 mg | MEDICATED_PATCH | Freq: Every day | TRANSDERMAL | Status: DC
  Administered 2014-03-26 – 2014-03-28 (×3): 21 mg via TRANSDERMAL
  Filled 2014-03-26 (×3): qty 1

## 2014-03-26 MED ORDER — ATROPINE SULFATE 0.1 MG/ML IJ SOLN: 0.5000 mg | INTRAMUSCULAR | Status: DC | PRN

## 2014-03-26 MED ORDER — MECLIZINE HCL 25 MG PO TABS
25.0000 mg | ORAL_TABLET | Freq: Three times a day (TID) | ORAL | Status: DC | PRN
  Filled 2014-03-26: qty 1

## 2014-03-26 MED ORDER — SODIUM CHLORIDE 0.9 % IJ SOLN
3.0000 mL | Freq: Two times a day (BID) | INTRAMUSCULAR | Status: DC
  Administered 2014-03-26 – 2014-03-28 (×3): 3 mL via INTRAVENOUS

## 2014-03-26 MED ORDER — ONDANSETRON HCL 4 MG PO TABS: 4.0000 mg | ORAL_TABLET | Freq: Four times a day (QID) | ORAL | Status: DC | PRN

## 2014-03-26 MED ORDER — SODIUM CHLORIDE 0.9 % IV SOLN
INTRAVENOUS | Status: DC
  Administered 2014-03-26: 21:00:00 via INTRAVENOUS

## 2014-03-26 MED ORDER — MECLIZINE HCL 25 MG PO TABS
25.0000 mg | ORAL_TABLET | ORAL | Status: AC
  Administered 2014-03-26: 25 mg via ORAL
  Filled 2014-03-26: qty 1

## 2014-03-26 NOTE — ED Notes (Signed)
Pt sent here by PCP. Last night began experiencing dizziness and felt like his head was spinning. sts the dizziness makes him nauseous. Denies HA. sts he has been getting some intermittent chest pain into left arm. Pt brady in the 40s at the doctor office.

## 2014-03-26 NOTE — ED Notes (Signed)
Dr. Wentz at the bedside.  

## 2014-03-26 NOTE — H&P (Signed)
[facility] History and Physical  JONQUEZ COURIER ZOX:[id number] DOB: 10-28-1945 DOA: 03/26/2014  Referring physician: Rolan Bucco, ER Physician PCP: Neena Rhymes, MD   Chief Complaint: Dizziness  HPI: Jacob Ruiz is a 68 y.o. male  With essentially no past medical history who went to bed last night and immediately felt like something was wrong. Whenever he would try to sit up or stand up, he felt the entire room spinning. The symptoms were so profound that he felt quite nauseated and threw up per family. Symptoms improved whenever he would lay back down.  Symptoms again persisted today leading to more vomiting. Patient came in to see his primary care physician and was then referred to the emergency room to rule out stroke. He was noted his primary care's office to have a heart rate in the 50s.  In the emergency room, CT scan head was unremarkable. Lab work was unremarkable. His heart rate was noted to be in the 40s. MRI was done which ruled out CVA. Hospitals were called for further evaluation and admission.  Review of Systems:  Patient seen after arrival to floor. Denies any headaches, vision changes, dysphagia, chest pain, palpitations, shortness of breath, wheeze, cough, abdominal pain, hematuria, dysuria, constipation, diarrhea.  No focal extremity numbness or weakness or pain. Patient's only complaint is that when he sits up he starts to feel like the room is spinning. This makes him quite nauseated. His review systems is otherwise negative  History reviewed. No pertinent past medical history. Past Surgical History  Procedure Laterality Date  . Knee arthroscopy Right    Social History:  reports that he has been smoking Cigarettes.  He has been smoking about 2.00 packs per day. He does not have any smokeless tobacco history on file. He reports that he does not drink alcohol or use illicit drugs.  lives at home with his wife. Does not use any assistance for activities of  daily living  Allergies  Allergen Reactions  . Lisinopril     REACTION: ANAPHYLAXIS  . Aleve [Naproxen Sodium] Rash    History reviewed. No pertinent family history.  Confirmed with patient.  Prior to Admission medications   Medication Sig Start Date End Date Taking? Authorizing Provider  vitamin E 400 UNIT capsule Take 400 Units by mouth daily.   Yes Historical Provider, MD   Physical Exam: Filed Vitals:   03/26/14 1830  BP: 158/69  Pulse: 44  Temp:   Resp: 17    BP 158/69  Pulse 44  Temp(Src) 98.1 F (36.7 C) (Oral)  Resp 17  Ht 5\' 11"  (1.803 m)  Wt 88.905 kg (196 lb)  BMI 27.35 kg/m2  SpO2 95%  General:  Alert and oriented x3, no acute distress Eyes: Sclera nonicteric, unable to elicit nystagmus  ENT:  normocephalic, atraumatic, mucous membranes are slightly dry  Neck: No JVD Cardiovascular: Reg rhythm, bradycardic Respiratory: CTA bilaterally Abdomen: s soft, nontender, nondistended, positive bowel sounds  Skin: no skin breaks, tears or lesions Musculoskeletal: no clubbing or cyanosis or edema  Psychiatric:  E. she is appropriate no evidence of psychoses or  Neurologic: No focal deficits, cranial nerves II through XII are intact.           Labs on Admission:  Basic Metabolic Panel:  Recent Labs Lab 03/26/14 1209 03/26/14 1236  NA 141 141  K 4.4 4.2  CL 100 103  CO2 25  --   GLUCOSE 106* 105*  BUN 11 11  CREATININE 0.63  0.70  CALCIUM 9.4  --    CBC:  Recent Labs Lab 03/26/14 1209 03/26/14 1236  WBC 9.9  --   NEUTROABS 8.0*  --   HGB 15.9 17.3*  HCT 46.5 51.0  MCV 92.8  --   PLT 164  --     Radiological Exams on Admission: Dg Chest 2 View  03/26/2014    IMPRESSION: No acute cardiopulmonary disease. Findings may represent component of mild COPD.   Electronically Signed   By: Salome HolmesHector  Cooper M.D.   On: 03/26/2014 12:32   Mr Brain Wo Contrast  03/26/2014    IMPRESSION: 1. No acute intracranial abnormality. 2. 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 The finding is nonspecific but can be seen in the setting of chronic microvascular ischemia, a demyelinating process such as multiple sclerosis, vasculitis, complicated migraine headaches, or as the sequelae of a prior infectious or inflammatory process. 3. No acute traumatic injury.   Electronically Signed   By: Gennette Pachris  Mattern M.D.   On: 03/26/2014 18:11    EKG: Independently reviewed. Noted bradycardia  Assessment/Plan Principal Problem:   Vertigo: Given stroke ruled out, this could be be something as simple as benign positional vertigo. We'll try Antivert. That said, symptoms seem to be worse whenever patient sits up or stands. This could indicate hypoperfusion brought on by bradycardia. Despite being volume depleted because of severe vomiting, his heart rate still only in the mid 40s. We'll try IV fluids and check TSH. When necessary atropine his heart rate gets below 40. If symptoms persist tomorrow and no better with Antivert, we'll have cardiology see patient  Active Problems:   Elevated blood pressure: Patient with no history formally of high blood pressure. It may be more elevated because of bradycardia or just dizziness. We'll continue to follow.    BENIGN PROSTATIC HYPERTROPHY, HX OF   Bradycardia: As above. Check TSH    Tobacco abuse: Nicotine patch    Code Status: Full code Family Communication: Wife & son at the bedside Disposition Plan: depending on resolution of symptoms and underlying cause, home possibly as early as tomorrow  Time spent: 35 minutes  Hollice EspyKRISHNAN,Hilding Quintanar K [facility] Pager [phone]  **Disclaimer: This note may have been dictated with voice recognition software. Similar sounding words can inadvertently be transcribed and this note may contain transcription errors which may not have been corrected upon publication of note.**

## 2014-03-26 NOTE — ED Notes (Signed)
Tim, NT transporting patient upstairs.

## 2014-03-26 NOTE — ED Provider Notes (Signed)
CSN: [id number]     Arrival date & time 03/26/14  1146 History   First MD Initiated Contact with Patient 03/26/14 1156     Chief Complaint  Patient presents with  . Dizziness  . Bradycardia     (Consider location/radiation/quality/duration/timing/severity/associated sxs/prior Treatment) HPI  Jacob Ruiz is a 68 y.o. male who suddenly developed dizziness, last night. The dizziness is episodic, and aggravated by movement from sitting to standing, and walking. He has a spinning sensation. He feels like he is walking like he is drunk. The dizziness causes him to vomit. He denies headache or blurred vision. He has had similar problems in the past. He went to his PCPs office today, and was sent here for further evaluation after being treated with Phenergan. He denies fever or chills, chest pain, cough, paresthesias, or change in bowels, and urinary habits. There are no other known modifying factors.   History reviewed. No pertinent past medical history. Past Surgical History  Procedure Laterality Date  . Knee arthroscopy Right    History reviewed. No pertinent family history. History  Substance Use Topics  . Smoking status: Current Every Day Smoker -- 2.00 packs/day    Types: Cigarettes  . Smokeless tobacco: Not on file  . Alcohol Use: No    Review of Systems  All other systems reviewed and are negative.     Allergies  Lisinopril and Aleve  Home Medications   Prior to Admission medications   Medication Sig Start Date End Date Taking? Authorizing Provider  vitamin E 400 UNIT capsule Take 400 Units by mouth daily.   Yes Historical Provider, MD   BP 150/66  Pulse 44  Temp(Src) 98.1 F (36.7 C) (Oral)  Resp 20  Ht 5\' 11"  (1.803 m)  Wt 196 lb (88.905 kg)  BMI 27.35 kg/m2  SpO2 98% Physical Exam  Nursing note and vitals reviewed. Constitutional: He is oriented to person, place, and time. He appears well-developed and well-nourished.  HENT:  Head: Normocephalic and  atraumatic.  Right Ear: External ear normal.  Left Ear: External ear normal.  Eyes: Conjunctivae and EOM are normal. Pupils are equal, round, and reactive to light.  Neck: Normal range of motion and phonation normal. Neck supple.  Cardiovascular: Normal rate, regular rhythm, normal heart sounds and intact distal pulses.   Pulmonary/Chest: Effort normal and breath sounds normal. He exhibits no bony tenderness.  Abdominal: Soft. There is no tenderness.  Musculoskeletal: Normal range of motion.  Neurological: He is alert and oriented to person, place, and time. No cranial nerve deficit or sensory deficit. He exhibits normal muscle tone. Coordination normal.  No nystagmus, dysarthria or aphasia. Negative Head Impulse testing, and negative Test of Skew. Mildly abnormal finger to nose testing on the left, normal finger to nose testing on right. Normal heel-to-shin, bilaterally.  Skin: Skin is warm, dry and intact.  Psychiatric: He has a normal mood and affect. His behavior is normal. Judgment and thought content normal.    ED Course  Procedures (including critical care time)  Patient's dizziness started greater than 14 hours ago. Therefore, he is not a candidate for thrombolysis.    Medications - No data to display  Patient Vitals for the past 24 hrs:  BP Temp Temp src Pulse Resp SpO2 Height Weight  03/26/14 1430 150/66 mmHg - - 44 20 98 % - -  03/26/14 1401 137/61 mmHg - - 47 20 96 % - -  03/26/14 1400 137/61 mmHg - - 52 19 99 % - -  03/26/14 1330 143/68 mmHg - - 49 15 96 % - -  03/26/14 1300 146/71 mmHg - - 43 13 98 % - -  03/26/14 1234 - 98.1 F (36.7 C) - - - - - -  03/26/14 1215 - - - 53 22 95 % - -  03/26/14 1150 131/65 mmHg 97.4 F (36.3 C) Oral 48 16 95 % 5\' 11"  (1.803 m) 196 lb (88.905 kg)   A brief discussion with the stroke neurologist, who recommended admitting the patient, whether or not the MRI indicates CVA    Labs Review Labs Reviewed  BASIC METABOLIC PANEL -  Abnormal; Notable for the following:    Glucose, Bld 106 (*)    Anion gap 16 (*)    All other components within normal limits  CBC WITH DIFFERENTIAL - Abnormal; Notable for the following:    Neutrophils Relative % 81 (*)    Neutro Abs 8.0 (*)    All other components within normal limits  I-STAT CHEM 8, ED - Abnormal; Notable for the following:    Glucose, Bld 105 (*)    Hemoglobin 17.3 (*)    All other components within normal limits  ETHANOL  PROTIME-INR  APTT  URINE RAPID DRUG SCREEN (HOSP PERFORMED)  URINALYSIS, ROUTINE W REFLEX MICROSCOPIC  CBC  I-STAT TROPOININ, ED  Rosezena Sensor, ED    Imaging Review Dg Chest 2 View  03/26/2014   CLINICAL DATA:  DIZZINESS BRADYCARDIA  EXAM: CHEST  2 VIEW  COMPARISON:  Rib series 11 05/2012.  FINDINGS: Cardiac silhouette within normal limits. Atherosclerotic calcifications within the aorta. No focal reason consolidation nor focal infiltrates. There is flattening in the hemidiaphragms mild increased AP diameter of the chest. No acute osseous abnormalities.  IMPRESSION: No acute cardiopulmonary disease. Findings may represent component of mild COPD.   Electronically Signed   By: Salome Holmes M.D.   On: 03/26/2014 12:32     EKG Interpretation   Date/Time:  Thursday March 26 2014 11:50:36 EDT Ventricular Rate:  48 PR Interval:  156 QRS Duration: 98 QT Interval:  476 QTC Calculation: 425 R Axis:   75 Text Interpretation:  Marked sinus bradycardia Septal infarct , age  undetermined Abnormal ECG Since last tracing rate faster Confirmed by  [facility]  MD, Myonna Chisom [phone]) on 03/26/2014 2:00:49 PM      MDM   Final diagnoses:  Dizziness    Nonspecific dizziness, without associated weakness, dysarthria, or aphasia. Symptoms are concerning for acute CVA.  Symptoms started greater than 14 hours prior to arriving in the emergency department. The patient did not meet criteria for thrombolysis.  Nursing Notes Reviewed/ Care Coordinated, and agree  without changes. Applicable Imaging Reviewed.  Interpretation of Laboratory Data incorporated into ED treatment  Plan: Admit    Flint Melter, MD 03/26/14 2036

## 2014-03-27 LAB — BASIC METABOLIC PANEL
Anion gap: 13 (ref 5–15)
BUN: 12 mg/dL (ref 6–23)
CO2: 26 mEq/L (ref 19–32)
Calcium: 8.8 mg/dL (ref 8.4–10.5)
Chloride: 100 mEq/L (ref 96–112)
Creatinine, Ser: 0.62 mg/dL (ref 0.50–1.35)
GFR calc Af Amer: >90 mL/min (ref >90)
GFR calc non Af Amer: >90 mL/min (ref >90)
Glucose, Bld: 112 mg/dL — ABNORMAL HIGH (ref 70–99)
Potassium: 4.2 mEq/L (ref 3.7–5.3)
Sodium: 139 mEq/L (ref 137–147)

## 2014-03-27 LAB — CBC
HCT: 43 % (ref 39.0–52.0)
Hemoglobin: 14.1 g/dL (ref 13.0–17.0)
MCH: 30.5 pg (ref 26.0–34.0)
MCHC: 32.8 g/dL (ref 30.0–36.0)
MCV: 93.1 fL (ref 78.0–100.0)
Platelets: 150 10*3/uL (ref 150–400)
RBC: 4.62 MIL/uL (ref 4.22–5.81)
RDW: 13.1 % (ref 11.5–15.5)
WBC: 7.1 10*3/uL (ref 4.0–10.5)

## 2014-03-27 MED ORDER — AZITHROMYCIN 500 MG PO TABS
500.0000 mg | ORAL_TABLET | Freq: Every day | ORAL | Status: DC
  Administered 2014-03-27 – 2014-03-28 (×2): 500 mg via ORAL
  Filled 2014-03-27 (×2): qty 1

## 2014-03-27 MED ORDER — HYDRALAZINE HCL 25 MG PO TABS
25.0000 mg | ORAL_TABLET | Freq: Two times a day (BID) | ORAL | Status: DC
  Administered 2014-03-27 (×2): 25 mg via ORAL
  Filled 2014-03-27 (×4): qty 1

## 2014-03-27 MED ORDER — CEFUROXIME AXETIL 500 MG PO TABS
500.0000 mg | ORAL_TABLET | Freq: Two times a day (BID) | ORAL | Status: DC
  Administered 2014-03-27 – 2014-03-28 (×3): 500 mg via ORAL
  Filled 2014-03-27 (×5): qty 1

## 2014-03-27 MED ORDER — ANTIPYRINE-BENZOCAINE 5.4-1.4 % OT SOLN
2.0000 [drp] | OTIC | Status: DC | PRN
  Filled 2014-03-27: qty 10

## 2014-03-27 MED ORDER — FLUTICASONE PROPIONATE 50 MCG/ACT NA SUSP
1.0000 | Freq: Every day | NASAL | Status: DC
Start: 2014-03-27 — End: 2014-03-28
  Administered 2014-03-27 – 2014-03-28 (×2): 1 via NASAL
  Filled 2014-03-27: qty 16

## 2014-03-27 MED ORDER — LORATADINE 10 MG PO TABS
10.0000 mg | ORAL_TABLET | Freq: Every day | ORAL | Status: DC
  Administered 2014-03-27 – 2014-03-28 (×2): 10 mg via ORAL
  Filled 2014-03-27 (×2): qty 1

## 2014-03-27 MED ORDER — MECLIZINE HCL 25 MG PO TABS
25.0000 mg | ORAL_TABLET | Freq: Three times a day (TID) | ORAL | Status: DC
  Administered 2014-03-27 – 2014-03-28 (×4): 25 mg via ORAL
  Filled 2014-03-27 (×6): qty 1

## 2014-03-27 NOTE — Progress Notes (Signed)
Patient ID: Jacob Ruiz  male  QAS:[id number]    DOB: [birth year]    DOA: 03/26/2014  PCP: Neldon Labella, MD  Assessment/Plan: Principal Problem:   Vertigo likely BPPV caused by peripheral vertigo from right ear infection, sinusitis, mild bronchitis - Patient feeling still having symptoms on standing up, Dix-Hallpike maneuver positive -Placed on meclizine, both ears examined, has possible right ear infection, patient noticed allergies, ears popping over the weekend before his symptoms started.  - Placed on a Zithromax and Ceftin, Claritin, Flonase spray - PT/OT for vestibular evaluation - MRI negative for acute stroke  Active Problems:   Elevated blood pressure - Placed on hydralazine 25 mg BID, due to sinus bradycardia, hold off on any nodal blocking drugs, allergic to ACE inhibitors  Sinus bradycardia - Reviewed on telemetry, currently heart rate 58, no AV blocks on EKG, continue to monitor likely due to vasovagal response from acute vertigo    Tobacco abuse - Continue nicotine patch, constipation on smoking cessation   DVT Prophylaxis:Lovenox  Code Status:  Family Communication:  Disposition:Hopefully tomorrow if symptoms are improved  Consultants:  None  Procedures:  None  Antibiotics:  Zithromax  Ceftin  Subjective: Patient seen and examined, symptoms slightly better from yesterday still having vertigo on positional changes   Objective: Weight change:   Intake/Output Summary (Last 24 hours) at 03/27/14 1003 Last data filed at 03/27/14 0900  Gross per 24 hour  Intake    360 ml  Output    950 ml  Net   -590 ml   Blood pressure 156/77, pulse 50, temperature 98.6 F (37 C), temperature source Oral, resp. rate 18, height 5\' 11"  (1.803 m), weight 84.505 kg (186 lb 4.8 oz), SpO2 97.00%.  Physical Exam: General: Alert and awake, oriented x3, not in any acute distress. HEENT: anicteric sclera, PERLA, EOMI, Dix-Hallpike maneuver positive, no nystagmus    CVS: S1-S2 clear, no murmur rubs or gallops Chest: clear to auscultation bilaterally, no wheezing, rales or rhonchi Abdomen: soft nontender, nondistended, normal bowel sounds  Extremities: no cyanosis, clubbing or edema noted bilaterally Neuro: Cranial nerves II-XII intact, no focal neurological deficits  Lab Results: Basic Metabolic Panel:  Recent Labs Lab 03/26/14 1209 03/26/14 1236 03/26/14 2005 03/27/14 0440  NA 141 141  --  139  K 4.4 4.2  --  4.2  CL 100 103  --  100  CO2 25  --   --  26  GLUCOSE 106* 105*  --  112*  BUN 11 11  --  12  CREATININE 0.63 0.70 0.66 0.62  CALCIUM 9.4  --   --  8.8   Liver Function Tests: No results found for this basename: AST, ALT, ALKPHOS, BILITOT, PROT, ALBUMIN,  in the last 168 hours No results found for this basename: LIPASE, AMYLASE,  in the last 168 hours No results found for this basename: AMMONIA,  in the last 168 hours CBC:  Recent Labs Lab 03/26/14 1209  03/26/14 2005 03/27/14 0440  WBC 9.9  --  7.5 7.1  NEUTROABS 8.0*  --   --   --   HGB 15.9  < > 14.7 14.1  HCT 46.5  < > 43.9 43.0  MCV 92.8  --  92.6 93.1  PLT 164  --  162 150  < > = values in this interval not displayed. Cardiac Enzymes: No results found for this basename: CKTOTAL, CKMB, CKMBINDEX, TROPONINI,  in the last 168 hours BNP: No components found with this  basename: POCBNP,  CBG: No results found for this basename: GLUCAP,  in the last 168 hours   Micro Results: No results found for this or any previous visit (from the past 240 hour(s)).  Studies/Results: Dg Chest 2 View  03/26/2014   CLINICAL DATA:  DIZZINESS BRADYCARDIA  EXAM: CHEST  2 VIEW  COMPARISON:  Rib series 11 05/2012.  FINDINGS: Cardiac silhouette within normal limits. Atherosclerotic calcifications within the aorta. No focal reason consolidation nor focal infiltrates. There is flattening in the hemidiaphragms mild increased AP diameter of the chest. No acute osseous abnormalities.   IMPRESSION: No acute cardiopulmonary disease. Findings may represent component of mild COPD.   Electronically Signed   By: Salome HolmesHector  Cooper M.D.   On: 03/26/2014 12:32   Mr Brain Wo Contrast  03/26/2014   CLINICAL DATA:  Vertigo over the last 2 days.  Blunt trauma to head.  EXAM: MRI HEAD WITHOUT CONTRAST  TECHNIQUE: Multiplanar, multiecho pulse sequences of the brain and surrounding structures were obtained without intravenous contrast.  COMPARISON:  CT head without contrast 05/27/2006  FINDINGS: The diffusion-weighted images demonstrate no evidence for acute or subacute infarction. No hemorrhage or mass lesion is present. The ventricles scratch the periventricular and subcortical white matter changes are noted bilaterally, mildly advanced for age.  Flow is present in the major intracranial arteries. The globes and orbits are intact. A polyp or mucous retention cyst is noted along the floor of the right maxillary sinus. The remaining paranasal sinuses and mastoid air cells are clear.  IMPRESSION: 1. No acute intracranial abnormality. 2. 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 The finding is nonspecific but can be seen in the setting of chronic microvascular ischemia, a demyelinating process such as multiple sclerosis, vasculitis, complicated migraine headaches, or as the sequelae of a prior infectious or inflammatory process. 3. No acute traumatic injury.   Electronically Signed   By: Gennette Pachris  Mattern M.D.   On: 03/26/2014 18:11    Medications: Scheduled Meds: . azithromycin  500 mg Oral Daily  . cefUROXime  500 mg Oral BID WC  . enoxaparin (LOVENOX) injection  40 mg Subcutaneous Q24H  . fluticasone  1 spray Each Nare Daily  . meclizine  25 mg Oral TID  . nicotine  21 mg Transdermal Daily  . sodium chloride  3 mL Intravenous Q12H      LOS: 1 day   Eurydice Calixto M.D. [facility] 03/27/2014, 10:03 AM Pager: [phone]  If 7PM-7AM, please contact  night-coverage [email or web address] Password [email or web address]  **Disclaimer: This note was dictated with voice recognition software. Similar sounding words can inadvertently be transcribed and this note may contain transcription errors which may not have been corrected upon publication of note.**

## 2014-03-27 NOTE — Progress Notes (Addendum)
Nutrition Brief Note  Patient identified on the Malnutrition Screening Tool (MST) Report for recent weight loss without trying; per wt readings below, patient has had an 11% weight loss over the past 2 years -- not significant for time frame.  Wt Readings from Last 15 Encounters:  03/27/14 186 lb 4.8 oz (84.505 kg)  10/26/09 210 lb (95.255 kg)  02/01/09 207 lb 12.8 oz (94.257 kg)    Body mass index is 26 kg/(m^2). Patient meets criteria for Overweight based on current BMI.   Current diet order is Heart Healthy, patient is consuming approximately 100% of meals at this time. Labs and medications reviewed.   No nutrition interventions warranted at this time. If nutrition issues arise, please consult RD.   Maureen Chatters, RD, LDN Pager #: [phone] After-Hours Pager #: [phone]

## 2014-03-27 NOTE — Progress Notes (Signed)
UR Completed.  Isauro Skelley Jane [phone] 03/27/2014  

## 2014-03-27 NOTE — Evaluation (Signed)
Physical Therapy Evaluation Patient Details Name: Jacob Ruiz MRN: [id number] DOB: 05/18/1946 Today's Date: 03/27/2014   History of Present Illness  Patient admitted with dizziness and nausea/vomiting.  Positive sinusitis and right ear infection.  Also with HR in 40's.  Clinical Impression  Patient presents with symptoms consistent with right peripheral vestibular hypofunction as well as small component of BPPV affecting mobility and tolerance to activity.  Feel he will benefit from skilled PT in the acute setting to allow return home with wife assist and outpatient vestibular rehab.    Follow Up Recommendations Outpatient PT (for vestibular rehab)    Equipment Recommendations  None recommended by PT    Recommendations for Other Services       Precautions / Restrictions Precautions Precautions: Fall      Mobility  Bed Mobility Overal bed mobility: Modified Independent                Transfers Overall transfer level: Needs assistance   Transfers: Sit to/from Stand Sit to Stand: Supervision         General transfer comment: slow, guarded cues for eyes straight ahead on stationary target  Ambulation/Gait Ambulation/Gait assistance: Min guard Ambulation Distance (Feet): 160 Feet   Gait Pattern/deviations: Step-through pattern;Drifts right/left     General Gait Details: cues to stop and refixate on stationary target with turns, occasionally drifts right and mildly unsteady on his feet, but reports improved from prior attempts with visual fixation/compensation  Stairs            Wheelchair Mobility    Modified Rankin (Stroke Patients Only)       Balance Overall balance assessment: Needs assistance           Standing balance-Leahy Scale: Fair Standing balance comment: some unsteadiness/uneasiness even standing static                             Pertinent Vitals/Pain No pain    Home Living Family/patient expects to be  discharged to:: Private residence Living Arrangements: Spouse/significant other Available Help at Discharge: Family Type of Home: House Home Access: Stairs to enter Entrance Stairs-Rails: Doctor, general practiceight;Left Entrance Stairs-Number of Steps: 3 Home Layout: One level Home Equipment: Crutches      Prior Function Level of Independence: Independent         Comments: drives a truck     Hand Dominance   Dominant Hand: Right    Extremity/Trunk Assessment               Lower Extremity Assessment: Overall WFL for tasks assessed         Communication   Communication: No difficulties  Cognition Arousal/Alertness: Awake/alert Behavior During Therapy: WFL for tasks assessed/performed Overall Cognitive Status: Within Functional Limits for tasks assessed                      General Comments General comments (skin integrity, edema, etc.): Patient reports symptoms as brain is spinning (not room, just like he is "woozy in his head".  Did have worse symptoms with movement including nausea and vomiting.  Denies hearing changes and has h/o seasonal allergies and sinusitis.  Has been imbalanced since symptoms began as well.  Noted in MAR last Anivert this am at 1030.  Seated smooth pursuits and saccades WL without gaze holding nystagmus noted.  Head shake test also without noted nystagmus in room light.  Did not have Frenzel's to test with  fixation blocked.  Head thrust test positive for refixation bilaterally, VOR cancellation WNL.  VOR horizontal 20 seconds with symptoms up to 8/10, vertical  30 seconds with symptoms 5/10.  Positional testing with symtpoms severe on right side modified hall pike but did not note any nystagmus in position; left with two to three rotations to right and less symptoms than right.  Treated with Canalith repositioning for right posterior canal BPPV x 1.  Patient agrees to outpatient follow up for vestibular rehab..    Exercises        Assessment/Plan     PT Assessment Patient needs continued PT services  PT Diagnosis Abnormality of gait;Other (comment) (Vertigo, BPPV)   PT Problem List Decreased balance;Decreased activity tolerance;Decreased mobility;Other (comment) (peripheral vertigo)  PT Treatment Interventions DME instruction;Gait training;Therapeutic exercise;Balance training;Functional mobility training;Therapeutic activities;Patient/family education;Stair training;Neuromuscular re-education;Other (comment) (canaltith repositioning)   PT Goals (Current goals can be found in the Care Plan section) Acute Rehab PT Goals Patient Stated Goal: To return to work/driving PT Goal Formulation: With patient/family Time For Goal Achievement: 04/10/14 Potential to Achieve Goals: Good    Frequency Min 3X/week   Barriers to discharge        Co-evaluation               End of Session Equipment Utilized During Treatment: Gait belt Activity Tolerance: Patient tolerated treatment well Patient left: in bed;with call bell/phone within reach;with family/visitor present      Functional Assessment Tool Used: Clinical Judgement Functional Limitation: Mobility: Walking and moving around Mobility: Walking and Moving Around Current Status (V6979): At least 20 percent but less than 40 percent impaired, limited or restricted Mobility: Walking and Moving Around Goal Status [phone]): At least 1 percent but less than 20 percent impaired, limited or restricted    Time: 1050-1147 PT Time Calculation (min): 57 min   Charges:   PT Evaluation $Initial PT Evaluation Tier I: 1 Procedure PT Treatments $Gait Training: 8-22 mins $Neuromuscular Re-education: 8-22 mins $Canalith Rep Proc: 8-22 mins   PT G Codes:   Functional Assessment Tool Used: Clinical Judgement Functional Limitation: Mobility: Walking and moving around    [facility] 03/27/2014, 1:52 PM [place], [place] [phone] 03/27/2014

## 2014-03-27 NOTE — Progress Notes (Signed)
1254 03-27-14 CM did send EPIC referral to [facility] for outpatient vestibular therapy. No further needs from CM at this time. Gala Lewandowsky, RN,BSN [phone]

## 2014-03-28 MED ORDER — UNABLE TO FIND: Status: DC

## 2014-03-28 MED ORDER — LORATADINE 10 MG PO TABS
10.0000 mg | ORAL_TABLET | Freq: Every day | ORAL | Status: DC | PRN
Start: 2014-03-28 — End: 2020-11-09

## 2014-03-28 MED ORDER — AZITHROMYCIN 500 MG PO TABS: 500.0000 mg | ORAL_TABLET | Freq: Every day | ORAL | Status: DC

## 2014-03-28 MED ORDER — HYDRALAZINE HCL 50 MG PO TABS
50.0000 mg | ORAL_TABLET | Freq: Two times a day (BID) | ORAL | Status: DC
  Administered 2014-03-28: 50 mg via ORAL
  Filled 2014-03-28 (×2): qty 1

## 2014-03-28 MED ORDER — CEFUROXIME AXETIL 500 MG PO TABS: 500.0000 mg | ORAL_TABLET | Freq: Two times a day (BID) | ORAL | Status: DC

## 2014-03-28 MED ORDER — HYDRALAZINE HCL 50 MG PO TABS: 50.0000 mg | ORAL_TABLET | Freq: Two times a day (BID) | ORAL | Status: DC

## 2014-03-28 MED ORDER — FLUTICASONE PROPIONATE 50 MCG/ACT NA SUSP: 1.0000 | Freq: Every day | NASAL | Status: DC

## 2014-03-28 MED ORDER — ANTIPYRINE-BENZOCAINE 5.4-1.4 % OT SOLN: 2.0000 [drp] | OTIC | Status: DC | PRN

## 2014-03-28 MED ORDER — MECLIZINE HCL 25 MG PO TABS: 25.0000 mg | ORAL_TABLET | Freq: Three times a day (TID) | ORAL | Status: DC | PRN

## 2014-03-28 NOTE — Discharge Summary (Signed)
Physician Discharge Summary  Patient ID: Jacob Ruiz MRN: [id number] DOB/AGE: September 02, 1946 68 y.o.  Admit date: 03/26/2014 Discharge date: 03/28/2014  Primary Care Physician:  Neldon Labella, MD  Discharge Diagnoses:    . Vertigo likely BPPV caused by peripheral vertigo from right ear infection, sinusitis, mild bronchitis . Benign essential hypertension  . BENIGN PROSTATIC HYPERTROPHY, HX OF . Bradycardia . Tobacco abuse  Consults:  None   Recommendations for Outpatient Follow-up:  Outpatient vestibular rehabilitation recommended for the patient and arranged.  Please followup on BP, placed on hydralazine during this admission  Please refer to ENT if patient continues to have symptoms of vertigo despite vestibular rehabilitation  Patient was recommended NO driving until his symptoms are improved  Allergies:   Allergies  Allergen Reactions  . Lisinopril     REACTION: ANAPHYLAXIS  . Aleve [Naproxen Sodium] Rash     Discharge Medications:   Medication List         antipyrine-benzocaine otic solution  Commonly known as:  AURALGAN  Place 2 drops into the right ear every 4 (four) hours as needed for ear pain.     azithromycin 500 MG tablet  Commonly known as:  ZITHROMAX  Take 1 tablet (500 mg total) by mouth daily. X 7 DAYS     cefUROXime 500 MG tablet  Commonly known as:  CEFTIN  Take 1 tablet (500 mg total) by mouth 2 (two) times daily with a meal. X 1 WEEK     fluticasone 50 MCG/ACT nasal spray  Commonly known as:  FLONASE  Place 1 spray into both nostrils daily. For allergies     hydrALAZINE 50 MG tablet  Commonly known as:  APRESOLINE  Take 1 tablet (50 mg total) by mouth 2 (two) times daily.     loratadine 10 MG tablet  Commonly known as:  CLARITIN  Take 1 tablet (10 mg total) by mouth daily as needed for allergies.     meclizine 25 MG tablet  Commonly known as:  ANTIVERT  Take 1 tablet (25 mg total) by mouth 3 (three) times daily as needed for  dizziness (also available over the counter).     UNABLE TO FIND  - Vestibular rehabilitation/occupational therapy   -   -   -   - Diagnosis: Vertigo (BPPV)     vitamin E 400 UNIT capsule  Take 400 Units by mouth daily.         Brief H and P: For complete details please refer to admission H and P, but in brief Jacob Ruiz is a 68 y.o. male  With essentially no past medical history who went to bed last night and immediately felt like something was wrong. Whenever he would try to sit up or stand up, he felt the entire room spinning. The symptoms were so profound that he felt quite nauseated and threw up per family. Symptoms improved whenever he would lay back down. Symptoms again persisted on the day of admission leading to more vomiting. Patient came in to see his primary care physician and was then referred to the emergency room to rule out stroke. He was noted his primary care's office to have a heart rate in the 50s.  In the emergency room, CT scan head was unremarkable. Lab work was unremarkable. His heart rate was noted to be in the 40s. MRI was done which ruled out CVA.   Hospital Course:   Acute severe Vertigo likely BPPV caused by peripheral vertigo from right  ear infection, sinusitis, mild bronchitis: Dix-Hallpike maneuver positive patient was placed on telemetry due to bradycardia likely due to vasovagal episode from severe vertigo.   He was placed on meclizine, both ears examined, has possible right ear infection, patient noticed allergies, ears popping over the weekend before his symptoms started. Patient was placed on Zithromax and Ceftin, Claritin, Flonase spray. PT/OT was done for vestibular evaluation and recommended outpatient PT for vestibular rehabilitation. I also recommended him strongly to see ENT if his symptoms are still persisting despite vestibular rehabilitation.  MRI  was negative for acute stroke   Elevated blood pressure /Benign essential hypertension:  Patient's BP readings were elevated during the hospitalization, despite of his symptoms improving. Placed on hydralazine 50 mg BID, due to sinus bradycardia,  did not place him on nodal blocking drugs and he is  allergic to Ruiz inhibitors.   Sinus bradycardia - Reviewed on telemetry, currently heart rate 59, no AV blocks on EKG, likely due to vasovagal response from acute vertigo   Tobacco abuse  Patient was placed on nicotine patch and smoking cessation counseled     Day of Discharge BP 146/65  Pulse 51  Temp(Src) 97.5 F (36.4 C) (Oral)  Resp 18  Ht 5\' 11"  (1.803 m)  Wt 84.505 kg (186 lb 4.8 oz)  BMI 26.00 kg/m2  SpO2 96%  Physical Exam: General: Alert and awake oriented x3 not in any acute distress. HEENT: anicteric sclera, pupils reactive to light and accommodation CVS: S1-S2 clear no murmur rubs or gallops Chest: clear to auscultation bilaterally, no wheezing rales or rhonchi Abdomen: soft nontender, nondistended, normal bowel sounds Extremities: no cyanosis, clubbing or edema noted bilaterally Neuro: Cranial nerves II-XII intact, no focal neurological deficits   The results of significant diagnostics from this hospitalization (including imaging, microbiology, ancillary and laboratory) are listed below for reference.    LAB RESULTS: Basic Metabolic Panel:  Recent Labs Lab 03/26/14 1209 03/26/14 1236 03/26/14 2005 03/27/14 0440  NA 141 141  --  139  K 4.4 4.2  --  4.2  CL 100 103  --  100  CO2 25  --   --  26  GLUCOSE 106* 105*  --  112*  BUN 11 11  --  12  CREATININE 0.63 0.70 0.66 0.62  CALCIUM 9.4  --   --  8.8   Liver Function Tests: No results found for this basename: AST, ALT, ALKPHOS, BILITOT, PROT, ALBUMIN,  in the last 168 hours No results found for this basename: LIPASE, AMYLASE,  in the last 168 hours No results found for this basename: AMMONIA,  in the last 168 hours CBC:  Recent Labs Lab 03/26/14 1209  03/26/14 2005 03/27/14 0440  WBC 9.9   --  7.5 7.1  NEUTROABS 8.0*  --   --   --   HGB 15.9  < > 14.7 14.1  HCT 46.5  < > 43.9 43.0  MCV 92.8  --  92.6 93.1  PLT 164  --  162 150  < > = values in this interval not displayed. Cardiac Enzymes: No results found for this basename: CKTOTAL, CKMB, CKMBINDEX, TROPONINI,  in the last 168 hours BNP: No components found with this basename: POCBNP,  CBG: No results found for this basename: GLUCAP,  in the last 168 hours  Significant Diagnostic Studies:  Dg Chest 2 View  03/26/2014   CLINICAL DATA:  DIZZINESS BRADYCARDIA  EXAM: CHEST  2 VIEW  COMPARISON:  Rib series 11 05/2012.  FINDINGS: Cardiac silhouette within normal limits. Atherosclerotic calcifications within the aorta. No focal reason consolidation nor focal infiltrates. There is flattening in the hemidiaphragms mild increased AP diameter of the chest. No acute osseous abnormalities.  IMPRESSION: No acute cardiopulmonary disease. Findings may represent component of mild COPD.   Electronically Signed   By: Salome HolmesHector  Cooper M.D.   On: 03/26/2014 12:32   Mr Brain Wo Contrast  03/26/2014   CLINICAL DATA:  Vertigo over the last 2 days.  Blunt trauma to head.  EXAM: MRI HEAD WITHOUT CONTRAST  TECHNIQUE: Multiplanar, multiecho pulse sequences of the brain and surrounding structures were obtained without intravenous contrast.  COMPARISON:  CT head without contrast 05/27/2006  FINDINGS: The diffusion-weighted images demonstrate no evidence for acute or subacute infarction. No hemorrhage or mass lesion is present. The ventricles scratch the periventricular and subcortical white matter changes are noted bilaterally, mildly advanced for age.  Flow is present in the major intracranial arteries. The globes and orbits are intact. A polyp or mucous retention cyst is noted along the floor of the right maxillary sinus. The remaining paranasal sinuses and mastoid air cells are clear.  IMPRESSION: 1. No acute intracranial abnormality. 2. 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 The finding is nonspecific but can be seen in the setting of chronic microvascular ischemia, a demyelinating process such as multiple sclerosis, vasculitis, complicated migraine headaches, or as the sequelae of a prior infectious or inflammatory process. 3. No acute traumatic injury.   Electronically Signed   By: Gennette Pachris  Mattern M.D.   On: 03/26/2014 18:11      Disposition and Follow-up: Discharge Instructions   Diet - low sodium heart healthy    Complete by:  As directed      Discharge instructions    Complete by:  As directed   Please take meclizine three times/day for next 2 days, then as needed for dizziness  Please do not drive until symptoms of dizziness and vertigo are completely resolved.     Increase activity slowly    Complete by:  As directed             DISPOSITION: Home  DIET:Heart healthy   DISCHARGE FOLLOW-UP Follow-up Information   [facility] (Outpatient PT for vestibular evaluation and treatment- please call them if office has not called you by Tuesday.)    Specialty:  Neuro-Rehabilitation   Contact information:   [address] [id number] [place] [address] [phone]      Follow up with Neldon LabellaMILLER,LISA LYNN, MD. Schedule an appointment as soon as possible for a visit in 10 days. (for hospital follow-up)    Specialty:  Family Medicine   Contact information:   [address] [place] [address] [phone]       Time spent on Discharge: 40 mins  Signed:   Jared Cahn M.D. [facility] 03/28/2014, 9:08 AM Pager: [phone]   **Disclaimer: This note was dictated with voice recognition software. Similar sounding words can inadvertently be transcribed and this note may contain transcription errors which may not have been corrected upon publication of note.**

## 2014-03-28 NOTE — Progress Notes (Signed)
Physical Therapy Treatment Patient Details Name: Jacob Ruiz MRN: [id number] DOB: 04/14/1946 Today's Date: 03/28/2014    History of Present Illness Patient admitted with dizziness and nausea/vomiting.  Positive sinusitis and right ear infection.  Also with HR in 40's.    PT Comments    Patient with decreased dizziness today.  Preparing for d/c.  Provided home exercise program.  To have f/u with OP PT for vestibular rehab.  Follow Up Recommendations  Outpatient PT (Vestibular Rehab)     Equipment Recommendations  None recommended by PT    Recommendations for Other Services       Precautions / Restrictions Precautions Precautions: None Restrictions Weight Bearing Restrictions: No    Mobility  Bed Mobility Overal bed mobility: Independent                Transfers Overall transfer level: Modified independent Equipment used: None Transfers: Sit to/from Stand Sit to Stand: Modified independent (Device/Increase time)         General transfer comment: Increased time  Ambulation/Gait Ambulation/Gait assistance: Supervision Ambulation Distance (Feet): 130 Feet Assistive device: None Gait Pattern/deviations: WFL(Within Functional Limits) Gait velocity: WFL Gait velocity interpretation: at or above normal speed for age/gender General Gait Details: Reviewed use of stationary target with ambulation.  Instructed patient on technique for turns.  Gait steady with visual fixation.   Stairs            Wheelchair Mobility    Modified Rankin (Stroke Patients Only)       Balance Overall balance assessment: Needs assistance         Standing balance support: No upper extremity supported Standing balance-Leahy Scale: Good Standing balance comment: Static balance improved.                    Cognition Arousal/Alertness: Awake/alert Behavior During Therapy: WFL for tasks assessed/performed Overall Cognitive Status: Within Functional Limits for  tasks assessed                      Exercises Other Exercises Other Exercises: x1 exercises vertically and horizontally for 30 seconds, 3 reps, 3x/day    General Comments General comments (skin integrity, edema, etc.): Patient reports decreased dizziness today, with dizziness at 3/10 during session.  Patient with positive head thrust to right, with minimal refixation.  VOR vertical and horizontal x 30 seconds each with dizziness at 3/10 for both.  No nystagmus noted.  Instructed patient in x1 exercises, vertically and horizontally for 30 seconds each, 3 reps each, 3x/day.  Patient to have f/u OP PT for vestibular rehab.      Pertinent Vitals/Pain     Home Living                      Prior Function            PT Goals (current goals can now be found in the care plan section) Progress towards PT goals: Progressing toward goals    Frequency  Min 3X/week    PT Plan Current plan remains appropriate    Co-evaluation             End of Session   Activity Tolerance: Patient tolerated treatment well Patient left: in bed;with call bell/phone within reach;with nursing/sitter in room;with family/visitor present (sitting EOB)     Time: 2334-3568 PT Time Calculation (min): 13 min  Charges:  $Therapeutic Activity: 8-22 mins  G Codes:  Functional Assessment Tool Used: Clinical Judgement Functional Limitation: Mobility: Walking and moving around Mobility: Walking and Moving Around Goal Status [phone](G8979): At least 1 percent but less than 20 percent impaired, limited or restricted Mobility: Walking and Moving Around Discharge Status [phone](G8980): At least 1 percent but less than 20 percent impaired, limited or restricted   Vena AustriaDavis, Claudean Leavelle H 03/28/2014, 10:48 AM Durenda HurtSusan H. Renaldo Fiddleravis, PT, [facility] Acute Rehab Services Pager [phone]

## 2014-03-31 ENCOUNTER — Ambulatory Visit: Payer: Medicare Other | Attending: Internal Medicine | Admitting: Physical Therapy

## 2014-03-31 DIAGNOSIS — R269 Unspecified abnormalities of gait and mobility: Secondary | ICD-10-CM | POA: Diagnosis not present

## 2014-03-31 DIAGNOSIS — R42 Dizziness and giddiness: Secondary | ICD-10-CM | POA: Diagnosis not present

## 2014-03-31 DIAGNOSIS — IMO0001 Reserved for inherently not codable concepts without codable children: Secondary | ICD-10-CM | POA: Diagnosis present

## 2014-04-08 ENCOUNTER — Ambulatory Visit: Payer: Medicare Other | Admitting: Physical Therapy

## 2014-04-08 DIAGNOSIS — IMO0001 Reserved for inherently not codable concepts without codable children: Secondary | ICD-10-CM | POA: Diagnosis not present

## 2014-04-13 ENCOUNTER — Encounter: Payer: Medicare Other | Admitting: Physical Therapy

## 2014-04-20 ENCOUNTER — Encounter: Payer: Medicare Other | Admitting: Physical Therapy

## 2017-03-14 ENCOUNTER — Other Ambulatory Visit: Payer: Self-pay | Admitting: Physical Medicine and Rehabilitation

## 2017-03-14 DIAGNOSIS — S32010S Wedge compression fracture of first lumbar vertebra, sequela: Secondary | ICD-10-CM

## 2017-03-20 ENCOUNTER — Ambulatory Visit
Admission: RE | Admit: 2017-03-20 | Discharge: 2017-03-20 | Disposition: A | Discharge status: Home / Self Care | Payer: Medicare Other | Source: Ambulatory Visit | Attending: Physical Medicine and Rehabilitation | Admitting: Physical Medicine and Rehabilitation

## 2017-03-20 DIAGNOSIS — S32010S Wedge compression fracture of first lumbar vertebra, sequela: Secondary | ICD-10-CM

## 2017-03-21 MED ORDER — SODIUM CHLORIDE 0.9 % IV SOLN
Freq: Once | INTRAVENOUS | Status: AC
  Administered 2017-03-23: 08:00:00 via INTRAVENOUS

## 2017-03-21 MED ORDER — FENTANYL CITRATE (PF) 100 MCG/2ML IJ SOLN
25.0000 ug | INTRAMUSCULAR | Status: DC | PRN
  Administered 2017-03-23 (×3): 50 ug via INTRAVENOUS

## 2017-03-21 MED ORDER — MIDAZOLAM HCL 2 MG/2ML IJ SOLN
1.0000 mg | INTRAMUSCULAR | Status: DC | PRN
  Administered 2017-03-23 (×2): 1 mg via INTRAVENOUS

## 2017-03-21 MED ORDER — CEFAZOLIN SODIUM-DEXTROSE 1-4 GM/50ML-% IV SOLN
1.0000 g | Freq: Once | INTRAVENOUS | Status: AC
  Administered 2017-03-23: 1 g via INTRAVENOUS

## 2017-03-23 ENCOUNTER — Ambulatory Visit
Admission: RE | Admit: 2017-03-23 | Discharge: 2017-03-23 | Disposition: A | Discharge status: Home / Self Care | Payer: Medicare Other | Source: Ambulatory Visit | Attending: Physical Medicine and Rehabilitation | Admitting: Physical Medicine and Rehabilitation

## 2017-03-23 VITALS — BP 137/88 | HR 61 | Temp 97.7°F | Resp 18

## 2017-03-23 DIAGNOSIS — S32010S Wedge compression fracture of first lumbar vertebra, sequela: Secondary | ICD-10-CM

## 2017-03-23 NOTE — Progress Notes (Signed)
Sedation time for L1 VP is 44 minutes. Donell Sievert, RN

## 2017-03-23 NOTE — Discharge Instructions (Signed)
Vertebroplasty Post Procedure Discharge Instructions  1. May resume a regular diet and any medications that you routinely take (including pain medications). 2. No driving day of procedure. 3. Upon discharge go home and rest for at least 4 hours.  May use an ice pack as needed to injection sites on back. 4. Remove bandage off after shower in the morning. Replace with a bandaide daily until headed.  5. Follow up with your primary care physician re: bone therapy.     Please contact our office at [phone] for the following symptoms:   Fever greater than 100 degrees  Increased swelling, pain, or redness at injection site.   Thank you for visiting [facility].

## 2018-04-08 ENCOUNTER — Other Ambulatory Visit: Payer: Self-pay | Admitting: Otolaryngology

## 2018-04-08 ENCOUNTER — Ambulatory Visit
Admission: RE | Admit: 2018-04-08 | Discharge: 2018-04-08 | Disposition: A | Discharge status: Home / Self Care | Payer: Medicare Other | Source: Ambulatory Visit | Attending: Otolaryngology | Admitting: Otolaryngology

## 2018-04-08 DIAGNOSIS — R059 Cough, unspecified: Secondary | ICD-10-CM

## 2018-04-08 DIAGNOSIS — R05 Cough: Secondary | ICD-10-CM

## 2018-04-08 DIAGNOSIS — R7611 Nonspecific reaction to tuberculin skin test without active tuberculosis: Secondary | ICD-10-CM

## 2018-04-08 DIAGNOSIS — F17200 Nicotine dependence, unspecified, uncomplicated: Secondary | ICD-10-CM

## 2018-04-10 ENCOUNTER — Other Ambulatory Visit (HOSPITAL_COMMUNITY): Payer: Self-pay | Admitting: Otolaryngology

## 2018-04-10 DIAGNOSIS — R490 Dysphonia: Secondary | ICD-10-CM

## 2018-04-16 ENCOUNTER — Ambulatory Visit (HOSPITAL_COMMUNITY)
Admission: RE | Admit: 2018-04-16 | Discharge: 2018-04-16 | Disposition: A | Discharge status: Home / Self Care | Payer: Medicare Other | Source: Ambulatory Visit | Attending: Otolaryngology | Admitting: Otolaryngology

## 2018-04-16 DIAGNOSIS — R490 Dysphonia: Secondary | ICD-10-CM | POA: Diagnosis present

## 2018-04-16 MED ORDER — IOHEXOL 300 MG/ML  SOLN
100.0000 mL | Freq: Once | INTRAMUSCULAR | Status: AC | PRN
  Administered 2018-04-16: 75 mL via INTRAVENOUS

## 2018-09-08 ENCOUNTER — Emergency Department (HOSPITAL_COMMUNITY): Payer: Medicare Other

## 2018-09-08 ENCOUNTER — Other Ambulatory Visit: Payer: Self-pay

## 2018-09-08 ENCOUNTER — Emergency Department (HOSPITAL_COMMUNITY)
Admission: EM | Admit: 2018-09-08 | Discharge: 2018-09-08 | Disposition: A | Discharge status: Home / Self Care | Payer: Medicare Other | Attending: Emergency Medicine | Admitting: Emergency Medicine

## 2018-09-08 DIAGNOSIS — J40 Bronchitis, not specified as acute or chronic: Secondary | ICD-10-CM | POA: Insufficient documentation

## 2018-09-08 DIAGNOSIS — R0602 Shortness of breath: Secondary | ICD-10-CM | POA: Diagnosis present

## 2018-09-08 DIAGNOSIS — Z79899 Other long term (current) drug therapy: Secondary | ICD-10-CM | POA: Insufficient documentation

## 2018-09-08 DIAGNOSIS — F17210 Nicotine dependence, cigarettes, uncomplicated: Secondary | ICD-10-CM | POA: Diagnosis not present

## 2018-09-08 LAB — CBC WITH DIFFERENTIAL/PLATELET
Abs Immature Granulocytes: 0.02 10*3/uL (ref 0.00–0.07)
Basophils Absolute: 0 10*3/uL (ref 0.0–0.1)
Basophils Relative: 1 %
Eosinophils Absolute: 0.1 10*3/uL (ref 0.0–0.5)
Eosinophils Relative: 1 %
HCT: 47.4 % (ref 39.0–52.0)
Hemoglobin: 14.7 g/dL (ref 13.0–17.0)
Immature Granulocytes: 0 %
Lymphocytes Relative: 14 %
Lymphs Abs: 0.9 10*3/uL (ref 0.7–4.0)
MCH: 29.5 pg (ref 26.0–34.0)
MCHC: 31 g/dL (ref 30.0–36.0)
MCV: 95.2 fL (ref 80.0–100.0)
Monocytes Absolute: 0.7 10*3/uL (ref 0.1–1.0)
Monocytes Relative: 10 %
Neutro Abs: 4.8 10*3/uL (ref 1.7–7.7)
Neutrophils Relative %: 74 %
Platelets: 160 10*3/uL (ref 150–400)
RBC: 4.98 MIL/uL (ref 4.22–5.81)
RDW: 12.4 % (ref 11.5–15.5)
WBC: 6.6 10*3/uL (ref 4.0–10.5)
nRBC: 0 % (ref 0.0–0.2)

## 2018-09-08 LAB — BASIC METABOLIC PANEL
Anion gap: 11 (ref 5–15)
BUN: 6 mg/dL — ABNORMAL LOW (ref 8–23)
CO2: 28 mmol/L (ref 22–32)
Calcium: 8.9 mg/dL (ref 8.9–10.3)
Chloride: 96 mmol/L — ABNORMAL LOW (ref 98–111)
Creatinine, Ser: 0.71 mg/dL (ref 0.61–1.24)
GFR calc Af Amer: >60 mL/min (ref >60)
GFR calc non Af Amer: >60 mL/min (ref >60)
Glucose, Bld: 120 mg/dL — ABNORMAL HIGH (ref 70–99)
Potassium: 4.2 mmol/L (ref 3.5–5.1)
Sodium: 135 mmol/L (ref 135–145)

## 2018-09-08 MED ORDER — IPRATROPIUM-ALBUTEROL 0.5-2.5 (3) MG/3ML IN SOLN
3.0000 mL | Freq: Once | RESPIRATORY_TRACT | Status: AC
  Administered 2018-09-08: 3 mL via RESPIRATORY_TRACT
  Filled 2018-09-08: qty 3

## 2018-09-08 MED ORDER — ALBUTEROL SULFATE HFA 108 (90 BASE) MCG/ACT IN AERS
2.0000 | INHALATION_SPRAY | Freq: Once | RESPIRATORY_TRACT | Status: DC
  Filled 2018-09-08: qty 6.7

## 2018-09-08 MED ORDER — PREDNISONE 20 MG PO TABS: 60.0000 mg | ORAL_TABLET | Freq: Every day | ORAL | 0 refills | Status: DC

## 2018-09-08 MED ORDER — ACETAMINOPHEN 325 MG PO TABS
650.0000 mg | ORAL_TABLET | Freq: Once | ORAL | Status: AC
  Administered 2018-09-08: 650 mg via ORAL
  Filled 2018-09-08: qty 2

## 2018-09-08 MED ORDER — IPRATROPIUM-ALBUTEROL 20-100 MCG/ACT IN AERS: 1.0000 | INHALATION_SPRAY | Freq: Four times a day (QID) | RESPIRATORY_TRACT | 0 refills | Status: DC

## 2018-09-08 MED ORDER — BENZONATATE 100 MG PO CAPS: 100.0000 mg | ORAL_CAPSULE | Freq: Three times a day (TID) | ORAL | 0 refills | Status: DC

## 2018-09-08 MED ORDER — BENZONATATE 100 MG PO CAPS
200.0000 mg | ORAL_CAPSULE | Freq: Once | ORAL | Status: AC
  Administered 2018-09-08: 200 mg via ORAL
  Filled 2018-09-08: qty 2

## 2018-09-08 MED ORDER — PREDNISONE 20 MG PO TABS
60.0000 mg | ORAL_TABLET | Freq: Once | ORAL | Status: AC
  Administered 2018-09-08: 60 mg via ORAL
  Filled 2018-09-08: qty 3

## 2018-09-08 MED ORDER — PREDNISONE 20 MG PO TABS: 60.0000 mg | ORAL_TABLET | Freq: Every day | ORAL | 0 refills | Status: AC

## 2018-09-08 NOTE — ED Notes (Signed)
Pt ambulated about 93ft without any concerns.  O2sats remained at 96% on room air during ambulation. Pt denies SOB, and states he feels fine. Breathing remained unlabored.

## 2018-09-08 NOTE — ED Notes (Signed)
Patient denies pain and is resting comfortably.  

## 2018-09-08 NOTE — Discharge Instructions (Addendum)
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  You may take cough medication.  Please take steroids once daily for the next 5 days as directed.  I would like for you to follow-up with your primary care doctor in the next week for reevaluation.  Return to the emergency department if you have worsening cough, chest pain, shortness of breath, fevers or any other new or concerning symptoms.

## 2018-09-08 NOTE — ED Provider Notes (Signed)
Patient presented to the ER with cough and shortness of breath for 2 days.  Patient reports pain in his upper chest, upper back and neck when he coughs.  Face to face Exam: HEENT - PERRLA Lungs -diminished breath sounds bilaterally  heart - RRR, no M/R/G Abd - S/NT/ND Neuro - alert, oriented x3  Plan: Obtain x-ray, work-up for possible pneumonia versus smoking-related congestion and cough.   Gilda Crease, MD 09/08/18 [phone]

## 2018-09-08 NOTE — ED Triage Notes (Signed)
Pt came in complaining of shob beginning on Friday afternoon. Having coughing, and neck pain. Pt is an active smoker.

## 2018-09-08 NOTE — ED Provider Notes (Signed)
[facility] EMERGENCY DEPARTMENT Provider Note   CSN: [id number] Arrival date & time: 09/08/18  4163     History   Chief Complaint Chief Complaint  Patient presents with  . Shortness of Breath    HPI Jacob Ruiz is a 72 y.o. male.  Jacob Ruiz is a 72 y.o. male history of chronic bronchitis, hiatal hernia, hypertension, bradycardia, vertigo and tobacco abuse, who presents to the emergency department for evaluation of cough.  She reports cough started Friday night after he was sitting out on his screened in porch in the cold with his heater for a prolonged amount of time.  He reports since then he has had persistent coughing occasionally productive of clear sputum with some nasal congestion and rhinorrhea.  Patient also reports that he started to feel very short of breath starting last night.  He reports some soreness over his upper back, neck and upper chest when he coughs but no chest pain otherwise.  He has not had any fevers, but does report some chills.  No lightheadedness or syncope.  No abdominal pain, nausea or vomiting.  Patient denies any history of COPD but does have documented history of chronic bronchitis in his chart and is still a daily smoker.  Does not use any inhalers at home and has not taken anything for his symptoms prior to arrival.  On initial arrival patient was feeling short of breath with increased work of breathing and was placed on 2 L nasal cannula by nursing staff.     Past Medical History:  Diagnosis Date  . Chronic bronchitis    "get it just about q yr" (03/26/2014)  . H/O hiatal hernia     Patient Active Problem List   Diagnosis Date Noted  . Vertigo 03/26/2014  . Bradycardia 03/26/2014  . Tobacco abuse 03/26/2014  . ERECTILE DYSFUNCTION, PSYCHOGENIC 10/26/2009  . URGE INCONTINENCE 10/26/2009  . BENIGN PROSTATIC HYPERTROPHY, HX OF 10/26/2009  . Elevated blood pressure 10/20/2006    Past Surgical History:  Procedure  Laterality Date  . KNEE ARTHROSCOPY Right 1970's  . TONSILLECTOMY  1950's        Home Medications    Prior to Admission medications   Medication Sig Start Date End Date Taking? Authorizing Provider  antipyrine-benzocaine Lyla Son) otic solution Place 2 drops into the right ear every 4 (four) hours as needed for ear pain. 03/28/14   Rai, Ripudeep K, MD  azithromycin (ZITHROMAX) 500 MG tablet Take 1 tablet (500 mg total) by mouth daily. X 7 DAYS 03/28/14   Rai, Delene Ruffini, MD  cefUROXime (CEFTIN) 500 MG tablet Take 1 tablet (500 mg total) by mouth 2 (two) times daily with a meal. X 1 WEEK 03/28/14   Rai, Ripudeep K, MD  fluticasone (FLONASE) 50 MCG/ACT nasal spray Place 1 spray into both nostrils daily. For allergies 03/28/14   Rai, Delene Ruffini, MD  hydrALAZINE (APRESOLINE) 50 MG tablet Take 1 tablet (50 mg total) by mouth 2 (two) times daily. 03/28/14   Rai, Delene Ruffini, MD  loratadine (CLARITIN) 10 MG tablet Take 1 tablet (10 mg total) by mouth daily as needed for allergies. 03/28/14   Rai, Delene Ruffini, MD  meclizine (ANTIVERT) 25 MG tablet Take 1 tablet (25 mg total) by mouth 3 (three) times daily as needed for dizziness (also available over the counter). 03/28/14   Cathren Harsh, MD  UNABLE TO FIND Vestibular rehabilitation/occupational therapy     Diagnosis: Vertigo (BPPV) 03/28/14  Rai, Ripudeep Kirtland Bouchard, MD  vitamin E 400 UNIT capsule Take 400 Units by mouth daily.    [provider]    Family History No family history on file.  Social History Social History   Tobacco Use  . Smoking status: Current Every Day Smoker    Packs/day: 2.00    Years: 54.00    Pack years: 108.00    Types: Cigarettes  . Smokeless tobacco: Never Used  Substance Use Topics  . Alcohol use: No  . Drug use: No     Allergies   Lisinopril and Aleve [naproxen sodium]   Review of Systems Review of Systems  Constitutional: Negative for chills and fever.  HENT: Positive for postnasal drip and rhinorrhea.  Negative for congestion and sore throat.   Respiratory: Positive for cough and shortness of breath. Negative for chest tightness and wheezing.   Cardiovascular: Negative for chest pain, palpitations and leg swelling.  Gastrointestinal: Negative for abdominal pain, nausea and vomiting.  Musculoskeletal: Positive for myalgias and neck pain. Negative for arthralgias.  Skin: Negative for color change and rash.  Neurological: Negative for dizziness, syncope and light-headedness.  All other systems reviewed and are negative.    Physical Exam Updated Vital Signs BP (!) 189/85 (BP Location: Right Arm)   Pulse 74   Temp 99.8 F (37.7 C) (Oral)   Resp 18   SpO2 97%   Physical Exam Vitals signs and nursing note reviewed.  Constitutional:      General: He is not in acute distress.    Appearance: Normal appearance. He is well-developed. He is not ill-appearing or diaphoretic.  HENT:     Head: Normocephalic and atraumatic.     Right Ear: Tympanic membrane normal.     Left Ear: Tympanic membrane normal.     Nose: Mucosal edema and rhinorrhea present.     Mouth/Throat:     Mouth: Mucous membranes are moist.     Pharynx: Oropharynx is clear. Uvula midline. No pharyngeal swelling, oropharyngeal exudate, posterior oropharyngeal erythema or uvula swelling.     Tonsils: No tonsillar exudate.     Comments: Patient with hoarse raspy voice, which he reports is chronic Eyes:     General:        Right eye: No discharge.        Left eye: No discharge.  Neck:     Musculoskeletal: Neck supple. No neck rigidity or muscular tenderness.     Comments: No rigidity Cardiovascular:     Rate and Rhythm: Normal rate and regular rhythm.     Pulses: Normal pulses.     Heart sounds: Normal heart sounds. No murmur. No friction rub. No gallop.   Pulmonary:     Effort: No respiratory distress.     Comments: Patient tachypneic with slight increase in respiratory effort, still able to speak in full sentences, on  auscultation lung sounds are diminished throughout without focal wheezing, rales or rhonchi. Abdominal:     General: Abdomen is flat. Bowel sounds are normal. There is no distension.     Palpations: Abdomen is soft. There is no mass.     Tenderness: There is no abdominal tenderness. There is no guarding.     Comments: Abdomen soft, nondistended, nontender to palpation in all quadrants without guarding or peritoneal signs  Musculoskeletal:     Right lower leg: No edema.     Left lower leg: No edema.  Skin:    General: Skin is warm and dry.  Neurological:  Mental Status: He is alert and oriented to person, place, and time. Mental status is at baseline.     Coordination: Coordination normal.  Psychiatric:        Mood and Affect: Mood normal.        Behavior: Behavior normal.      ED Treatments / Results  Labs (all labs ordered are listed, but only abnormal results are displayed) Labs Reviewed  BASIC METABOLIC PANEL - Abnormal; Notable for the following components:      Result Value   Chloride 96 (*)    Glucose, Bld 120 (*)    BUN 6 (*)    All other components within normal limits  CBC WITH DIFFERENTIAL/PLATELET    EKG EKG Interpretation  Date/Time:  Sunday September 08 2018 06:39:46 EST Ventricular Rate:  75 PR Interval:    QRS Duration: 113 QT Interval:  391 QTC Calculation: 437 R Axis:   -64 Text Interpretation:  Sinus rhythm Probable anteroseptal infarct, old No significant change since last tracing Confirmed by Gilda Crease [phone]) on 09/08/2018 6:45:36 AM   Radiology Dg Chest 2 View  Result Date: 09/08/2018 CLINICAL DATA:  Cough for 2 days. EXAM: CHEST - 2 VIEW COMPARISON:  April 08, 2018 FINDINGS: The heart, hila, mediastinum, lungs, and pleura are unremarkable. No acute abnormalities identified. IMPRESSION: No active cardiopulmonary disease. Electronically Signed   By: Gerome Sam III M.D   On: 09/08/2018 07:37    Procedures Procedures  (including critical care time)  Medications Ordered in ED Medications  benzonatate (TESSALON) capsule 200 mg (200 mg Oral Given 09/08/18 0750)  ipratropium-albuterol (DUONEB) 0.5-2.5 (3) MG/3ML nebulizer solution 3 mL (3 mLs Nebulization Given 09/08/18 0804)  acetaminophen (TYLENOL) tablet 650 mg (650 mg Oral Given 09/08/18 0750)  predniSONE (DELTASONE) tablet 60 mg (60 mg Oral Given 09/08/18 0800)     Initial Impression / Assessment and Plan / ED Course  I have reviewed the triage vital signs and the nursing notes.  Pertinent labs & imaging results that were available during my care of the patient were reviewed by me and considered in my medical decision making (see chart for details).  Presents with cough and shortness of breath for the past 2 to 3 days.  Current smoker.  No fevers.  On arrival patient with low-grade fever, hypertensive and O2 sats approximately 91% on room air with some tachypnea and increased work of breathing.  Patient placed on 2 L nasal cannula with improvement.  Lung exam reveals Minich breath sounds throughout without focal wheezing or rales.  Patient is not having any chest pain.  Frequently coughing throughout exam.  Suspect bronchitis likely from chronic smoking versus pneumonia.  Will get basic labs, EKG and chest x-ray and give nebulizer treatment, steroids and Tessalon for cough.  EKG without concerning ischemic changes.  Chest x-ray shows no active cardiopulmonary disease.  Lab work overall reassuring, no leukocytosis, normal hemoglobin and no acute electrolyte derangements requiring intervention.  After medications and to breathing treatments patient reports significant improvement in symptoms and is no longer requiring oxygen.  Patient ambulated in the hallway while maintaining O2 sats greater than 96%. Patient's long smoking history suspect he has some degree of COPD and this is likely a mild exacerbation of that.  Patient is not having increased sputum  production or fevers do not think that antibiotics are indicated, but will treat with 5-day steroid burst, provided with inhaler and will have him follow closely with his primary care doctor.  Return precautions have been discussed.  Patient expresses understanding and is in agreement with plan.  Stable for discharge home in good condition.  Final Clinical Impressions(s) / ED Diagnoses   Final diagnoses:  Bronchitis  SOB (shortness of breath)    ED Discharge Orders         Ordered    predniSONE (DELTASONE) 20 MG tablet  Daily     09/08/18 0930    benzonatate (TESSALON) 100 MG capsule  Every 8 hours     09/08/18 0930    Ipratropium-Albuterol (COMBIVENT) 20-100 MCG/ACT AERS respimat  Every 6 hours     09/08/18 0930           Dartha Lodge, PA-C 09/08/18 0932    Gilda Crease, MD 09/09/18 [phone]

## 2019-09-02 ENCOUNTER — Other Ambulatory Visit: Payer: Self-pay

## 2019-09-02 ENCOUNTER — Encounter (HOSPITAL_COMMUNITY): Payer: Self-pay | Admitting: Emergency Medicine

## 2019-09-02 ENCOUNTER — Emergency Department (HOSPITAL_COMMUNITY)
Admission: EM | Admit: 2019-09-02 | Discharge: 2019-09-02 | Disposition: A | Discharge status: Home / Self Care | Payer: Medicare Other | Attending: Emergency Medicine | Admitting: Emergency Medicine

## 2019-09-02 DIAGNOSIS — F17210 Nicotine dependence, cigarettes, uncomplicated: Secondary | ICD-10-CM | POA: Diagnosis not present

## 2019-09-02 DIAGNOSIS — Y999 Unspecified external cause status: Secondary | ICD-10-CM | POA: Insufficient documentation

## 2019-09-02 DIAGNOSIS — Z79899 Other long term (current) drug therapy: Secondary | ICD-10-CM | POA: Insufficient documentation

## 2019-09-02 DIAGNOSIS — W540XXA Bitten by dog, initial encounter: Secondary | ICD-10-CM | POA: Insufficient documentation

## 2019-09-02 DIAGNOSIS — S61412A Laceration without foreign body of left hand, initial encounter: Secondary | ICD-10-CM | POA: Diagnosis not present

## 2019-09-02 DIAGNOSIS — Y92009 Unspecified place in unspecified non-institutional (private) residence as the place of occurrence of the external cause: Secondary | ICD-10-CM | POA: Insufficient documentation

## 2019-09-02 DIAGNOSIS — Y9389 Activity, other specified: Secondary | ICD-10-CM | POA: Insufficient documentation

## 2019-09-02 DIAGNOSIS — S61452A Open bite of left hand, initial encounter: Secondary | ICD-10-CM

## 2019-09-02 MED ORDER — AMOXICILLIN-POT CLAVULANATE 875-125 MG PO TABS
1.0000 | ORAL_TABLET | Freq: Once | ORAL | Status: AC
  Administered 2019-09-02: 1 via ORAL
  Filled 2019-09-02: qty 1

## 2019-09-02 MED ORDER — LIDOCAINE-EPINEPHRINE (PF) 2 %-1:200000 IJ SOLN
10.0000 mL | Freq: Once | INTRAMUSCULAR | Status: AC
  Administered 2019-09-02: 10 mL via INTRADERMAL
  Filled 2019-09-02: qty 20

## 2019-09-02 MED ORDER — AMOXICILLIN-POT CLAVULANATE 875-125 MG PO TABS: 1.0000 | ORAL_TABLET | Freq: Two times a day (BID) | ORAL | 0 refills | Status: DC

## 2019-09-02 NOTE — ED Triage Notes (Signed)
Pt states he was bitten by his dog. Dog is up to date on rabies vaccine. Bite is located on left hand. Pt has bandage over site.

## 2019-09-02 NOTE — ED Notes (Signed)
Patient verbalizes understanding of discharge instructions. Opportunity for questioning and answers were provided. Armband removed by staff, pt discharged from ED.  

## 2019-09-02 NOTE — Discharge Instructions (Signed)
1. Medications: Please take all of your antibiotics until finished!   Take your antibiotics with food.  Common side effects of antibiotics include nausea, vomiting, abdominal discomfort, and diarrhea. You may help offset some of this with probiotics which you can buy or get in yogurt. Do not eat  or take the probiotics until 2 hours after your antibiotic.    You can take 1 to 2 tablets of Tylenol every 6 hours as needed for pain.  2. Treatment: ice for swelling, keep wound clean with warm soap and water and keep bandage dry, do not submerge in water for 24 hours 3. Follow Up: Your stitches will dissolve within 1 week.  Your wound is at high risk for infection.  Return to the emergency department if any concerning signs or symptoms develop such as high fevers, redness, drainage of pus from the wound, or swelling.   WOUND CARE  Keep area clean and dry for 24 hours. Do not remove bandage, if applied.  After 24 hours, remove bandage and wash wound gently with mild soap and warm water. Reapply a new bandage after cleaning wound, if directed.   Continue daily cleansing with soap and water until stitches/staples are removed.  Do not apply any ointments or creams to the wound while stitches/staples are in place, as this may cause delayed healing. Return if you experience any of the following signs of infection: Swelling, redness, pus drainage, streaking, fever >101.0 F  Return if you experience excessive bleeding that does not stop after 15-20 minutes of constant, firm pressure.

## 2019-09-02 NOTE — ED Provider Notes (Signed)
[facility] EMERGENCY DEPARTMENT Provider Note   CSN: [id number] Arrival date & time: 09/02/19  1152     History   Chief Complaint Chief Complaint  Patient presents with  . Animal Bite    HPI Jacob Ruiz is a 73 y.o. male with history of BPH, hypertension presents for evaluation of acute onset, persistent wound to the left hand that occurred just prior to arrival.  He reports that his dog had an at home veterinary visit scheduled and when the vet arrived the dog became very anxious.  The patient reached out and grabbed his dog by his collar using his left hand and the dog then bit his left hand.  His dog is 79 years old and up-to-date on his immunizations.  Patient notes some throbbing pain around his lacerations but denies numbness or weakness of the hand, no paresthesias.  He is not on any blood thinners.  His tetanus is up-to-date.  His vet who was present and witnessed the bite is recommending at home quarantine and will visit daily for monitoring.  He is right-hand dominant.     The history is provided by the patient.    Past Medical History:  Diagnosis Date  . Chronic bronchitis (HCC)    "get it just about q yr" (03/26/2014)  . H/O hiatal hernia     Patient Active Problem List   Diagnosis Date Noted  . Vertigo 03/26/2014  . Bradycardia 03/26/2014  . Tobacco abuse 03/26/2014  . ERECTILE DYSFUNCTION, PSYCHOGENIC 10/26/2009  . URGE INCONTINENCE 10/26/2009  . BENIGN PROSTATIC HYPERTROPHY, HX OF 10/26/2009  . Elevated blood pressure 10/20/2006    Past Surgical History:  Procedure Laterality Date  . KNEE ARTHROSCOPY Right 1970's  . TONSILLECTOMY  1950's        Home Medications    Prior to Admission medications   Medication Sig Start Date End Date Taking? Authorizing Provider  amoxicillin-clavulanate (AUGMENTIN) 875-125 MG tablet Take 1 tablet by mouth every 12 (twelve) hours. 09/02/19   Travis Purk A, PA-C  antipyrine-benzocaine (AURALGAN)  otic solution Place 2 drops into the right ear every 4 (four) hours as needed for ear pain. 03/28/14   Rai, Ripudeep K, MD  azithromycin (ZITHROMAX) 500 MG tablet Take 1 tablet (500 mg total) by mouth daily. X 7 DAYS 03/28/14   Rai, Ripudeep K, MD  benzonatate (TESSALON) 100 MG capsule Take 1 capsule (100 mg total) by mouth every 8 (eight) hours. 09/08/18   Dartha Lodge, PA-C  cefUROXime (CEFTIN) 500 MG tablet Take 1 tablet (500 mg total) by mouth 2 (two) times daily with a meal. X 1 WEEK 03/28/14   Rai, Ripudeep K, MD  fluticasone (FLONASE) 50 MCG/ACT nasal spray Place 1 spray into both nostrils daily. For allergies 03/28/14   Rai, Delene Ruffini, MD  hydrALAZINE (APRESOLINE) 50 MG tablet Take 1 tablet (50 mg total) by mouth 2 (two) times daily. 03/28/14   Rai, Ripudeep K, MD  Ipratropium-Albuterol (COMBIVENT) 20-100 MCG/ACT AERS respimat Inhale 1 puff into the lungs every 6 (six) hours. 09/08/18   Dartha Lodge, PA-C  loratadine (CLARITIN) 10 MG tablet Take 1 tablet (10 mg total) by mouth daily as needed for allergies. 03/28/14   Rai, Delene Ruffini, MD  meclizine (ANTIVERT) 25 MG tablet Take 1 tablet (25 mg total) by mouth 3 (three) times daily as needed for dizziness (also available over the counter). 03/28/14   Cathren Harsh, MD  UNABLE TO FIND Vestibular rehabilitation/occupational therapy  Diagnosis: Vertigo (BPPV) 03/28/14   Rai, Delene Ruffiniipudeep K, MD  vitamin E 400 UNIT capsule Take 400 Units by mouth daily.    [provider]    Family History No family history on file.  Social History Social History   Tobacco Use  . Smoking status: Current Every Day Smoker    Packs/day: 2.00    Years: 54.00    Pack years: 108.00    Types: Cigarettes  . Smokeless tobacco: Never Used  Substance Use Topics  . Alcohol use: No  . Drug use: No     Allergies   Lisinopril, Ace inhibitors, and Aleve [naproxen sodium]   Review of Systems Review of Systems  Musculoskeletal: Negative for arthralgias.   Skin: Positive for wound.  Neurological: Negative for weakness and numbness.  Hematological: Does not bruise/bleed easily.     Physical Exam Updated Vital Signs BP (!) 163/97 (BP Location: Right Arm)   Pulse 66   Temp 98.2 F (36.8 C) (Oral)   Resp 16   Physical Exam Vitals signs and nursing note reviewed.  Constitutional:      General: He is not in acute distress.    Appearance: He is well-developed.  HENT:     Head: Normocephalic and atraumatic.  Eyes:     General:        Right eye: No discharge.        Left eye: No discharge.     Conjunctiva/sclera: Conjunctivae normal.  Neck:     Vascular: No JVD.     Trachea: No tracheal deviation.  Cardiovascular:     Rate and Rhythm: Normal rate.     Pulses: Normal pulses.     Comments: 2+ radial pulses bilaterally Pulmonary:     Effort: Pulmonary effort is normal.  Abdominal:     General: There is no distension.  Musculoskeletal:     Comments: See below images.  Patient with 7 cm laceration to the dorsum of the left hand.  Mild tenderness to palpation around this laceration.  Also has a 1 cm V-shaped laceration to the ulnar aspect of the left hand.  Bleeding controlled.  5/5 strength of bilateral wrist and digits with flexion and extension against resistance.  No crepitus noted.  Skin:    General: Skin is warm and dry.     Findings: No erythema.  Neurological:     Mental Status: He is alert.     Comments: Sensation intact to light touch of bilateral hands.  Equal grip strength bilaterally.  Psychiatric:        Behavior: Behavior normal.          ED Treatments / Results  Labs (all labs ordered are listed, but only abnormal results are displayed) Labs Reviewed - No data to display  EKG None  Radiology No results found.  Procedures .Marland Kitchen.Laceration Repair  Date/Time: 09/02/2019 1:39 PM Performed by: Jeanie SewerFawze, Helem Reesor A, PA-C Authorized by: Jeanie SewerFawze, Jashua Knaak A, PA-C   Consent:    Consent obtained:  Verbal   Consent given  by:  Patient   Risks discussed:  Infection, need for additional repair, pain, poor cosmetic result and poor wound healing   Alternatives discussed:  No treatment and delayed treatment Universal protocol:    Procedure explained and questions answered to patient or proxy's satisfaction: yes     Relevant documents present and verified: yes     Test results available and properly labeled: yes     Imaging studies available: yes  Required blood products, implants, devices, and special equipment available: yes     Site/side marked: yes     Immediately prior to procedure, a time out was called: yes     Patient identity confirmed:  Verbally with patient and arm band Anesthesia (see MAR for exact dosages):    Anesthesia method:  Local infiltration   Local anesthetic:  Lidocaine 2% WITH epi Laceration details:    Location:  Hand   Hand location:  L hand, dorsum   Length (cm):  7   Depth (mm):  2 Repair type:    Repair type:  Intermediate Pre-procedure details:    Preparation:  Patient was prepped and draped in usual sterile fashion Exploration:    Hemostasis achieved with:  Direct pressure   Wound exploration: wound explored through full range of motion     Wound extent: areolar tissue violated     Wound extent: no foreign bodies/material noted, no muscle damage noted, no nerve damage noted and no tendon damage noted     Contaminated: yes   Treatment:    Area cleansed with:  Betadine and saline   Amount of cleaning:  Extensive   Irrigation solution:  Sterile saline   Irrigation volume:  2L   Irrigation method:  Pressure wash   Visualized foreign bodies/material removed: no   Skin repair:    Repair method:  Sutures   Suture size:  4-0   Wound skin closure material used: vicryl rapide.   Suture technique:  Simple interrupted   Number of sutures:  4 Approximation:    Approximation:  Loose Post-procedure details:    Dressing:  Sterile dressing   Patient tolerance of procedure:   Tolerated well, no immediate complications   (including critical care time)  Medications Ordered in ED Medications  amoxicillin-clavulanate (AUGMENTIN) 875-125 MG per tablet 1 tablet (1 tablet Oral Given 09/02/19 1302)  lidocaine-EPINEPHrine (XYLOCAINE W/EPI) 2 %-1:200000 (PF) injection 10 mL (10 mLs Intradermal Given by Other 09/02/19 1302)     Initial Impression / Assessment and Plan / ED Course  I have reviewed the triage vital signs and the nursing notes.  Pertinent labs & imaging results that were available during my care of the patient were reviewed by me and considered in my medical decision making (see chart for details).        Patient presenting for evaluation after dog bite.  He is afebrile, mildly hypertensive.  He is nontoxic in appearance, neurovascularly intact. Normal range of motion of the wrists and is able to flex and extend against resistance.  No concern for tendon or nerve injury.  He has a smaller wound along the ulnar aspect of the left hand and a larger deeper wound to the dorsum of the left hand which will require loose closure with suture repair.  I do not feel that radiographs are indicated as I have a low suspicion of retained foreign body or acute osseous abnormality.  Pressure irrigation performed and the wound was extensively cleaned. Wound explored and base of wound visualized in a bloodless field without evidence of foreign body.  Laceration occurred < 8 hours prior to repair which was well tolerated.  Discussed utility of different suture materials for repair and he elects to go with absorbable suture and does not wish to return for suture removal.  His tetanus is up-to-date.  Patient discharged with course of Augmentin for prophylaxis.  Discussed suture home care with patient and answered questions.  Discussed that his wounds are  at high risk for infection and discussed strict ED return precautions.  Patient and wife verbalized understanding of and agreement  with plan and patient stable for discharge home at this time  Final Clinical Impressions(s) / ED Diagnoses   Final diagnoses:  Dog bite of left hand, initial encounter    ED Discharge Orders         Ordered    amoxicillin-clavulanate (AUGMENTIN) 875-125 MG tablet  Every 12 hours     09/02/19 1337           Jeanie Sewer, PA-C 09/02/19 1343    Milagros Loll, MD 09/03/19 [phone]

## 2020-03-02 ENCOUNTER — Other Ambulatory Visit: Payer: Self-pay | Admitting: Family Medicine

## 2020-03-02 DIAGNOSIS — R109 Unspecified abdominal pain: Secondary | ICD-10-CM

## 2020-03-03 ENCOUNTER — Ambulatory Visit
Admission: RE | Admit: 2020-03-03 | Discharge: 2020-03-03 | Disposition: A | Discharge status: Home / Self Care | Payer: Medicare Other | Source: Ambulatory Visit | Attending: Family Medicine | Admitting: Family Medicine

## 2020-03-03 DIAGNOSIS — R109 Unspecified abdominal pain: Secondary | ICD-10-CM

## 2020-03-03 MED ORDER — IOPAMIDOL (ISOVUE-300) INJECTION 61%
100.0000 mL | Freq: Once | INTRAVENOUS | Status: AC | PRN
  Administered 2020-03-03: 100 mL via INTRAVENOUS

## 2020-08-06 ENCOUNTER — Other Ambulatory Visit (HOSPITAL_COMMUNITY): Payer: Self-pay | Admitting: Orthopedic Surgery

## 2020-08-06 ENCOUNTER — Other Ambulatory Visit: Payer: Self-pay

## 2020-08-06 ENCOUNTER — Ambulatory Visit (HOSPITAL_COMMUNITY)
Admission: RE | Admit: 2020-08-06 | Discharge: 2020-08-06 | Disposition: A | Discharge status: Home / Self Care | Payer: Medicare Other | Source: Ambulatory Visit | Attending: Orthopedic Surgery | Admitting: Orthopedic Surgery

## 2020-08-06 DIAGNOSIS — M79662 Pain in left lower leg: Secondary | ICD-10-CM

## 2020-10-08 DIAGNOSIS — M6281 Muscle weakness (generalized): Secondary | ICD-10-CM | POA: Diagnosis not present

## 2020-10-08 DIAGNOSIS — R262 Difficulty in walking, not elsewhere classified: Secondary | ICD-10-CM | POA: Diagnosis not present

## 2020-10-08 DIAGNOSIS — M25672 Stiffness of left ankle, not elsewhere classified: Secondary | ICD-10-CM | POA: Diagnosis not present

## 2020-10-08 DIAGNOSIS — M25662 Stiffness of left knee, not elsewhere classified: Secondary | ICD-10-CM | POA: Diagnosis not present

## 2020-10-14 DIAGNOSIS — S82035D Nondisplaced transverse fracture of left patella, subsequent encounter for closed fracture with routine healing: Secondary | ICD-10-CM | POA: Diagnosis not present

## 2020-10-21 DIAGNOSIS — H25093 Other age-related incipient cataract, bilateral: Secondary | ICD-10-CM | POA: Diagnosis not present

## 2020-10-25 DIAGNOSIS — R262 Difficulty in walking, not elsewhere classified: Secondary | ICD-10-CM | POA: Diagnosis not present

## 2020-10-25 DIAGNOSIS — M25662 Stiffness of left knee, not elsewhere classified: Secondary | ICD-10-CM | POA: Diagnosis not present

## 2020-10-25 DIAGNOSIS — M25672 Stiffness of left ankle, not elsewhere classified: Secondary | ICD-10-CM | POA: Diagnosis not present

## 2020-10-25 DIAGNOSIS — M6281 Muscle weakness (generalized): Secondary | ICD-10-CM | POA: Diagnosis not present

## 2020-11-05 DIAGNOSIS — M25662 Stiffness of left knee, not elsewhere classified: Secondary | ICD-10-CM | POA: Diagnosis not present

## 2020-11-05 DIAGNOSIS — M6281 Muscle weakness (generalized): Secondary | ICD-10-CM | POA: Diagnosis not present

## 2020-11-05 DIAGNOSIS — R262 Difficulty in walking, not elsewhere classified: Secondary | ICD-10-CM | POA: Diagnosis not present

## 2020-11-05 DIAGNOSIS — M25672 Stiffness of left ankle, not elsewhere classified: Secondary | ICD-10-CM | POA: Diagnosis not present

## 2020-11-07 ENCOUNTER — Emergency Department (HOSPITAL_COMMUNITY): Payer: Medicare Other

## 2020-11-07 ENCOUNTER — Observation Stay (HOSPITAL_COMMUNITY)
Admission: EM | Admit: 2020-11-07 | Discharge: 2020-11-09 | Disposition: A | Discharge status: Home / Self Care | Payer: Medicare Other | Attending: Internal Medicine | Admitting: Internal Medicine

## 2020-11-07 ENCOUNTER — Other Ambulatory Visit: Payer: Self-pay

## 2020-11-07 DIAGNOSIS — J9811 Atelectasis: Secondary | ICD-10-CM | POA: Diagnosis not present

## 2020-11-07 DIAGNOSIS — Z20822 Contact with and (suspected) exposure to covid-19: Secondary | ICD-10-CM | POA: Insufficient documentation

## 2020-11-07 DIAGNOSIS — Z720 Tobacco use: Secondary | ICD-10-CM

## 2020-11-07 DIAGNOSIS — Z79899 Other long term (current) drug therapy: Secondary | ICD-10-CM | POA: Diagnosis not present

## 2020-11-07 DIAGNOSIS — R0789 Other chest pain: Secondary | ICD-10-CM | POA: Diagnosis not present

## 2020-11-07 DIAGNOSIS — I10 Essential (primary) hypertension: Secondary | ICD-10-CM | POA: Diagnosis not present

## 2020-11-07 DIAGNOSIS — I209 Angina pectoris, unspecified: Secondary | ICD-10-CM | POA: Diagnosis not present

## 2020-11-07 DIAGNOSIS — I200 Unstable angina: Secondary | ICD-10-CM | POA: Diagnosis not present

## 2020-11-07 DIAGNOSIS — R6889 Other general symptoms and signs: Secondary | ICD-10-CM | POA: Diagnosis not present

## 2020-11-07 DIAGNOSIS — R079 Chest pain, unspecified: Secondary | ICD-10-CM | POA: Diagnosis present

## 2020-11-07 DIAGNOSIS — R059 Cough, unspecified: Secondary | ICD-10-CM | POA: Diagnosis not present

## 2020-11-07 DIAGNOSIS — J449 Chronic obstructive pulmonary disease, unspecified: Secondary | ICD-10-CM | POA: Insufficient documentation

## 2020-11-07 DIAGNOSIS — Z743 Need for continuous supervision: Secondary | ICD-10-CM | POA: Diagnosis not present

## 2020-11-07 DIAGNOSIS — F17210 Nicotine dependence, cigarettes, uncomplicated: Secondary | ICD-10-CM | POA: Insufficient documentation

## 2020-11-07 DIAGNOSIS — R0902 Hypoxemia: Secondary | ICD-10-CM | POA: Diagnosis not present

## 2020-11-07 LAB — CBC
HCT: 43.8 % (ref 39.0–52.0)
Hemoglobin: 14.2 g/dL (ref 13.0–17.0)
MCH: 30 pg (ref 26.0–34.0)
MCHC: 32.4 g/dL (ref 30.0–36.0)
MCV: 92.6 fL (ref 80.0–100.0)
Platelets: 218 10*3/uL (ref 150–400)
RBC: 4.73 MIL/uL (ref 4.22–5.81)
RDW: 12.9 % (ref 11.5–15.5)
WBC: 9.4 10*3/uL (ref 4.0–10.5)
nRBC: 0 % (ref 0.0–0.2)

## 2020-11-07 LAB — COMPREHENSIVE METABOLIC PANEL
ALT: 13 U/L (ref 0–44)
AST: 15 U/L (ref 15–41)
Albumin: 4.1 g/dL (ref 3.5–5.0)
Alkaline Phosphatase: 72 U/L (ref 38–126)
Anion gap: 12 (ref 5–15)
BUN: 9 mg/dL (ref 8–23)
CO2: 23 mmol/L (ref 22–32)
Calcium: 9 mg/dL (ref 8.9–10.3)
Chloride: 102 mmol/L (ref 98–111)
Creatinine, Ser: 0.58 mg/dL — ABNORMAL LOW (ref 0.61–1.24)
GFR, Estimated: >60 mL/min (ref >60)
Glucose, Bld: 108 mg/dL — ABNORMAL HIGH (ref 70–99)
Potassium: 4 mmol/L (ref 3.5–5.1)
Sodium: 137 mmol/L (ref 135–145)
Total Bilirubin: 0.8 mg/dL (ref 0.3–1.2)
Total Protein: 7 g/dL (ref 6.5–8.1)

## 2020-11-07 LAB — RESP PANEL BY RT-PCR (FLU A&B, COVID) ARPGX2
Influenza A by PCR: NEGATIVE
Influenza B by PCR: NEGATIVE
SARS Coronavirus 2 by RT PCR: NEGATIVE

## 2020-11-07 LAB — LIPID PANEL
Cholesterol: 197 mg/dL (ref 0–200)
HDL: 64 mg/dL (ref >40)
LDL Cholesterol: 118 mg/dL — ABNORMAL HIGH (ref 0–99)
Total CHOL/HDL Ratio: 3.1 RATIO
Triglycerides: 76 mg/dL (ref <150)
VLDL: 15 mg/dL (ref 0–40)

## 2020-11-07 LAB — D-DIMER, QUANTITATIVE: D-Dimer, Quant: 0.64 ug/mL-FEU — ABNORMAL HIGH (ref 0.00–0.50)

## 2020-11-07 LAB — TROPONIN I (HIGH SENSITIVITY)
Troponin I (High Sensitivity): 6 ng/L (ref <18)
Troponin I (High Sensitivity): 7 ng/L (ref <18)

## 2020-11-07 LAB — MRSA PCR SCREENING: MRSA by PCR: NEGATIVE

## 2020-11-07 MED ORDER — AMLODIPINE BESYLATE 5 MG PO TABS
5.0000 mg | ORAL_TABLET | Freq: Every day | ORAL | Status: DC
  Administered 2020-11-07 – 2020-11-09 (×3): 5 mg via ORAL
  Filled 2020-11-07 (×3): qty 1

## 2020-11-07 MED ORDER — ALBUTEROL SULFATE (2.5 MG/3ML) 0.083% IN NEBU: 2.5000 mg | INHALATION_SOLUTION | RESPIRATORY_TRACT | Status: DC | PRN

## 2020-11-07 MED ORDER — ACETAMINOPHEN 500 MG PO TABS: 1000.0000 mg | ORAL_TABLET | Freq: Four times a day (QID) | ORAL | Status: DC | PRN

## 2020-11-07 MED ORDER — ENOXAPARIN SODIUM 40 MG/0.4ML ~~LOC~~ SOLN
40.0000 mg | SUBCUTANEOUS | Status: DC
  Administered 2020-11-07: 40 mg via SUBCUTANEOUS
  Filled 2020-11-07: qty 0.4

## 2020-11-07 MED ORDER — IOHEXOL 350 MG/ML SOLN
70.0000 mL | Freq: Once | INTRAVENOUS | Status: AC | PRN
  Administered 2020-11-07: 70 mL via INTRAVENOUS

## 2020-11-07 MED ORDER — NICOTINE 21 MG/24HR TD PT24
21.0000 mg | MEDICATED_PATCH | Freq: Every day | TRANSDERMAL | Status: DC
  Administered 2020-11-07 – 2020-11-09 (×3): 21 mg via TRANSDERMAL
  Filled 2020-11-07 (×3): qty 1

## 2020-11-07 MED ORDER — ONDANSETRON HCL 4 MG/2ML IJ SOLN: 4.0000 mg | Freq: Four times a day (QID) | INTRAMUSCULAR | Status: DC | PRN

## 2020-11-07 NOTE — ED Triage Notes (Signed)
Pt BIBA from home after experiencing left sided chest pain with radiation to arm while sitting in church. Pt states the pain came on while coughing or inhaling. Pt rec'd 324mg  ASA and 1 nitro en route. Pain decreased with nitro from a 7 to a 10.

## 2020-11-07 NOTE — H&P (Signed)
History and Physical    Jacob Ruiz RTM:[id number] DOB: 1945-11-20 DOA: 11/07/2020  PCP: Sigmund Hazel, MD (Confirm with patient/family/NH records and if not entered, this has to be entered at [facility] point of entry) Patient coming from: Home  I have personally briefly reviewed patient's old medical records in [facility]  Chief Complaint: Chest pain  HPI: Jacob Ruiz is a 75 y.o. male with medical history significant of HTN noncompliant with BP meds, COPD, cigarette smoker, presented with new onset of chest pains.  Patient was sitting in the church, and suddenly started to feel sharp-like chest pain lower left chest, radiating to left upper arm, with sharp-like 8/10 than became more severe and squeezing-like, associated with strong feeling of shortness of breath and sweating, no nausea vomiting. Had to remain seated, no obvious alleviating or exacerbating factors. Episode lasted 20 to 30 minutes and responded to sublingual nitroglycerin. Patient also remembered that he had a similar episode 3 to 4 weeks ago, when he was at work and was emotional and all of sudden started to have sharp like chest pain on same spot but not irradiating and subsided by its own in that episode.  ED Course: Troponin negative x2, EKG showed chronic ST changes on V1 V2. CT angiogram negative for PE but severe calcification on 3 coronary arteries.  Review of Systems: As per HPI otherwise 14 point review of systems negative.    Past Medical History:  Diagnosis Date  . Chronic bronchitis (HCC)    "get it just about q yr" (03/26/2014)  . H/O hiatal hernia     Past Surgical History:  Procedure Laterality Date  . KNEE ARTHROSCOPY Right 1970's  . TONSILLECTOMY  1950's     reports that he has been smoking cigarettes. He has a 108.00 pack-year smoking history. He has never used smokeless tobacco. He reports that he does not drink alcohol and does not use drugs.  Allergies  Allergen Reactions  . Lisinopril  Anaphylaxis  . Ace Inhibitors     unknown  . Aleve [Naproxen Sodium] Rash    No family history on file.   Prior to Admission medications   Medication Sig Start Date End Date Taking? Authorizing Provider  acetaminophen (TYLENOL) 500 MG tablet Take 1,000 mg by mouth every 6 (six) hours as needed for mild pain.   Yes [provider]  albuterol (VENTOLIN HFA) 108 (90 Base) MCG/ACT inhaler Inhale 1 puff into the lungs every 4 (four) hours as needed for wheezing or shortness of breath. 09/30/20  Yes [provider]  LINIMENTS EX Apply 1 application topically daily as needed (back and knee pain).   Yes [provider]  amoxicillin-clavulanate (AUGMENTIN) 875-125 MG tablet Take 1 tablet by mouth every 12 (twelve) hours. Patient not taking: No sig reported 09/02/19   Michela Pitcher A, PA-C  antipyrine-benzocaine [facility]) otic solution Place 2 drops into the right ear every 4 (four) hours as needed for ear pain. Patient not taking: No sig reported 03/28/14   Rai, Ripudeep K, MD  azithromycin (ZITHROMAX) 500 MG tablet Take 1 tablet (500 mg total) by mouth daily. X 7 DAYS Patient not taking: No sig reported 03/28/14   Rai, Ripudeep K, MD  benzonatate (TESSALON) 100 MG capsule Take 1 capsule (100 mg total) by mouth every 8 (eight) hours. Patient not taking: No sig reported 09/08/18   Dartha Lodge, PA-C  cefUROXime (CEFTIN) 500 MG tablet Take 1 tablet (500 mg total) by mouth 2 (two)  times daily with a meal. X 1 WEEK Patient not taking: No sig reported 03/28/14   Rai, Ripudeep K, MD  fluticasone (FLONASE) 50 MCG/ACT nasal spray Place 1 spray into both nostrils daily. For allergies Patient not taking: No sig reported 03/28/14   Rai, Ripudeep K, MD  hydrALAZINE (APRESOLINE) 50 MG tablet Take 1 tablet (50 mg total) by mouth 2 (two) times daily. Patient not taking: No sig reported 03/28/14   Rai, Ripudeep K, MD  Ipratropium-Albuterol (COMBIVENT) 20-100 MCG/ACT AERS respimat Inhale 1 puff into  the lungs every 6 (six) hours. Patient not taking: No sig reported 09/08/18   Dartha Lodge, PA-C  loratadine (CLARITIN) 10 MG tablet Take 1 tablet (10 mg total) by mouth daily as needed for allergies. Patient not taking: No sig reported 03/28/14   Rai, Delene Ruffini, MD  meclizine (ANTIVERT) 25 MG tablet Take 1 tablet (25 mg total) by mouth 3 (three) times daily as needed for dizziness (also available over the counter). Patient not taking: No sig reported 03/28/14   Cathren Harsh, MD  UNABLE TO FIND Vestibular rehabilitation/occupational therapy     Diagnosis: Vertigo (BPPV) Patient not taking: No sig reported 03/28/14   Cathren Harsh, MD    Physical Exam: Vitals:   11/07/20 1415 11/07/20 1430 11/07/20 1500 11/07/20 1600  BP: (!) 163/100 (!) 157/84 (!) 174/94 (!) 162/52  Pulse: (!) 51 60 (!) 59 (!) 58  Resp: (!) 25 (!) 23 (!) 22 14  Temp:      TempSrc:      SpO2: 92% 94% 94% 95%    Constitutional: NAD, calm, comfortable Vitals:   11/07/20 1415 11/07/20 1430 11/07/20 1500 11/07/20 1600  BP: (!) 163/100 (!) 157/84 (!) 174/94 (!) 162/52  Pulse: (!) 51 60 (!) 59 (!) 58  Resp: (!) 25 (!) 23 (!) 22 14  Temp:      TempSrc:      SpO2: 92% 94% 94% 95%   Eyes: PERRL, lids and conjunctivae normal ENMT: Mucous membranes are moist. Posterior pharynx clear of any exudate or lesions.Normal dentition.  Neck: normal, supple, no masses, no thyromegaly Respiratory: clear to auscultation bilaterally, no wheezing, no crackles. Normal respiratory effort. No accessory muscle use.  Cardiovascular: Regular rate and rhythm, no murmurs / rubs / gallops. No extremity edema. 2+ pedal pulses. No carotid bruits.  Abdomen: no tenderness, no masses palpated. No hepatosplenomegaly. Bowel sounds positive.  Musculoskeletal: no clubbing / cyanosis. No joint deformity upper and lower extremities. Good ROM, no contractures. Normal muscle tone.  Skin: no rashes, lesions, ulcers. No induration Neurologic: CN 2-12  grossly intact. Sensation intact, DTR normal. Strength 5/5 in all 4.  Psychiatric: Normal judgment and insight. Alert and oriented x 3. Normal mood.    Labs on Admission: I have personally reviewed following labs and imaging studies  CBC: Recent Labs  Lab 11/07/20 1230  WBC 9.4  HGB 14.2  HCT 43.8  MCV 92.6  PLT 218   Basic Metabolic Panel: Recent Labs  Lab 11/07/20 1230  NA 137  K 4.0  CL 102  CO2 23  GLUCOSE 108*  BUN 9  CREATININE 0.58*  CALCIUM 9.0   GFR: CrCl cannot be calculated (Unknown ideal weight.). Liver Function Tests: Recent Labs  Lab 11/07/20 1230  AST 15  ALT 13  ALKPHOS 72  BILITOT 0.8  PROT 7.0  ALBUMIN 4.1   No results for input(s): LIPASE, AMYLASE in the last 168 hours. No results for input(s): AMMONIA  in the last 168 hours. Coagulation Profile: No results for input(s): INR, PROTIME in the last 168 hours. Cardiac Enzymes: No results for input(s): CKTOTAL, CKMB, CKMBINDEX, TROPONINI in the last 168 hours. BNP (last 3 results) No results for input(s): PROBNP in the last 8760 hours. HbA1C: No results for input(s): HGBA1C in the last 72 hours. CBG: No results for input(s): GLUCAP in the last 168 hours. Lipid Profile: No results for input(s): CHOL, HDL, LDLCALC, TRIG, CHOLHDL, LDLDIRECT in the last 72 hours. Thyroid Function Tests: No results for input(s): TSH, T4TOTAL, FREET4, T3FREE, THYROIDAB in the last 72 hours. Anemia Panel: No results for input(s): VITAMINB12, FOLATE, FERRITIN, TIBC, IRON, RETICCTPCT in the last 72 hours. Urine analysis:    Component Value Date/Time   COLORURINE YELLOW 03/26/2014 1305   APPEARANCEUR CLEAR 03/26/2014 1305   LABSPEC 1.023 03/26/2014 1305   PHURINE 8.0 03/26/2014 1305   GLUCOSEU NEGATIVE 03/26/2014 1305   HGBUR NEGATIVE 03/26/2014 1305   HGBUR negative 10/26/2009 1451   BILIRUBINUR NEGATIVE 03/26/2014 1305   KETONESUR NEGATIVE 03/26/2014 1305   PROTEINUR NEGATIVE 03/26/2014 1305   UROBILINOGEN  0.2 03/26/2014 1305   NITRITE NEGATIVE 03/26/2014 1305   LEUKOCYTESUR NEGATIVE 03/26/2014 1305    Radiological Exams on Admission: CT Angio Chest PE W and/or Wo Contrast  Result Date: 11/07/2020 CLINICAL DATA:  75 y/o male from home after experiencing left sided chest pain with radiation to arm while sitting in church. Pt states the pain came on while coughing or inhaling. EXAM: CT ANGIOGRAPHY CHEST WITH CONTRAST TECHNIQUE: Multidetector CT imaging of the chest was performed using the standard protocol during bolus administration of intravenous contrast. Multiplanar CT image reconstructions and MIPs were obtained to evaluate the vascular anatomy. CONTRAST:  19mL OMNIPAQUE IOHEXOL 350 MG/ML SOLN COMPARISON:  Current chest radiograph.  Chest CT, 01/28/2004 FINDINGS: Cardiovascular: Pulmonary arteries are well opacified. There is no evidence of a pulmonary embolism. Heart is normal in size and configuration. Three-vessel coronary artery calcifications. No pericardial effusion. Aorta is normal in caliber. Aortic atherosclerosis. No significant stenosis. No dissection. Mediastinum/Nodes: No enlarged mediastinal, hilar, or axillary lymph nodes. Thyroid gland, trachea, and esophagus demonstrate no significant findings. Lungs/Pleura: Minor linear atelectasis at the base of the right lower lobe. Lungs otherwise clear. No pleural effusion or pneumothorax. Mild centrilobular paraseptal emphysema in the upper lobes. Upper Abdomen: No acute abnormality. Musculoskeletal: Minor compression deformity of T7, which appears chronic. No evidence of acute fracture. No bone lesion. Review of the MIP images confirms the above findings. IMPRESSION: 1. No evidence of a pulmonary embolism. 2. No acute findings. 3. Minor right lower lobe dependent atelectasis. 4. Emphysema and aortic atherosclerosis well as three-vessel coronary artery calcifications. Aortic Atherosclerosis (ICD10-I70.0) and Emphysema (ICD10-J43.9). Electronically  Signed   By: Amie Portland M.D.   On: 11/07/2020 15:34   DG Chest Port 1 View  Result Date: 11/07/2020 CLINICAL DATA:  Acute chest pain at church EXAM: PORTABLE CHEST 1 VIEW COMPARISON:  09/08/2018 FINDINGS: Hyperinflation compatible with background COPD/emphysema. Normal heart size and vascularity. Minor right base atelectasis or scarring. No acute pneumonia, collapse or consolidation. Negative for edema, effusion or pneumothorax. Trachea midline. Aorta atherosclerotic. Degenerative changes of the spine. IMPRESSION: COPD/emphysema. Minor right base atelectasis or scarring No acute process by plain radiography Aortic Atherosclerosis (ICD10-I70.0). Electronically Signed   By: Judie Petit.  Shick M.D.   On: 11/07/2020 13:15    EKG: Independently reviewed. Chronic ST segment changes on V1 V2  Assessment/Plan Active Problems:   Ischemic chest pain (  HCC)   Chest pain  (please populate well all problems here in Problem List. (For example, if patient is on BP meds at home and you resume or decide to hold them, it is a problem that needs to be her. Same for CAD, COPD, HLD and so on)  Angina like chest pain -Appears to have 3 vessel lesions CT angiogram. Discussed with on-call cardiology, who recommended a dedicated CTA tomorrow to rule out CAD. And contact cardiology any significant finding of FFR on CTA tomorrow. -Echo -Pt is allergic to NSAIDs, consider Plavix for prevention after tomorrow CT angiogram. -Educated to quit smoking -Check lipid panel  HTN uncontrolled, noncompliant with BP meds -Start amlodipine, patient allergic to ACEI -HR in upper 50s, not considering Coreg for now.  COPD -No symptoms signs of acute exacerbation -As needed albuterol  Cigar smoker -Nicotine patch and educated to quit.  DVT prophylaxis: Lovenox  code Status: FulLCode Family Communication: Wife at bedside Disposition Plan: Expect less than 2 midnight hospital stay unless CTA coronary shows indication for further  outpatient cardio workup. Consults called: Talked to oncall cardiology, consider re-consult after tomorrow's coronary CTA. Admission status: Tele Obs   Emeline General MD [facility] Pager [phone]  11/07/2020, 4:55 PM

## 2020-11-07 NOTE — ED Provider Notes (Signed)
[facility] EMERGENCY DEPARTMENT Provider Note   CSN: [id number] Arrival date & time: 11/07/20  1233     History Chief Complaint  Patient presents with  . Chest Pain    Jacob Ruiz is a 75 y.o. male.  Pt presents to the ED today with left sided cp.  Pt said it started at church [employer].  The pt said the pain is worse with coughing or with inhaling. The pt said it felt like someone was stabbing him, but it also feels like a pressure type sensation.  He did receive asa and nitro from EMS.  Pain has improved.        Past Medical History:  Diagnosis Date  . Chronic bronchitis (HCC)    "get it just about q yr" (03/26/2014)  . H/O hiatal hernia     Patient Active Problem List   Diagnosis Date Noted  . Vertigo 03/26/2014  . Bradycardia 03/26/2014  . Tobacco abuse 03/26/2014  . ERECTILE DYSFUNCTION, PSYCHOGENIC 10/26/2009  . URGE INCONTINENCE 10/26/2009  . BENIGN PROSTATIC HYPERTROPHY, HX OF 10/26/2009  . Elevated blood pressure 10/20/2006    Past Surgical History:  Procedure Laterality Date  . KNEE ARTHROSCOPY Right 1970's  . TONSILLECTOMY  1950's       No family history on file.  Social History   Tobacco Use  . Smoking status: Current Every Day Smoker    Packs/day: 2.00    Years: 54.00    Pack years: 108.00    Types: Cigarettes  . Smokeless tobacco: Never Used  Substance Use Topics  . Alcohol use: No  . Drug use: No    Home Medications Prior to Admission medications   Medication Sig Start Date End Date Taking? Authorizing Provider  acetaminophen (TYLENOL) 500 MG tablet Take 1,000 mg by mouth every 6 (six) hours as needed for mild pain.   Yes [provider]  albuterol (VENTOLIN HFA) 108 (90 Base) MCG/ACT inhaler Inhale 1 puff into the lungs every 4 (four) hours as needed for wheezing or shortness of breath. 09/30/20  Yes [provider]  LINIMENTS EX Apply 1 application topically daily as needed (back and knee pain).    Yes [provider]  amoxicillin-clavulanate (AUGMENTIN) 875-125 MG tablet Take 1 tablet by mouth every 12 (twelve) hours. Patient not taking: No sig reported 09/02/19   Michela Pitcher A, PA-C  antipyrine-benzocaine [facility]) otic solution Place 2 drops into the right ear every 4 (four) hours as needed for ear pain. Patient not taking: No sig reported 03/28/14   Rai, Ripudeep K, MD  azithromycin (ZITHROMAX) 500 MG tablet Take 1 tablet (500 mg total) by mouth daily. X 7 DAYS Patient not taking: No sig reported 03/28/14   Rai, Ripudeep K, MD  benzonatate (TESSALON) 100 MG capsule Take 1 capsule (100 mg total) by mouth every 8 (eight) hours. Patient not taking: No sig reported 09/08/18   Dartha Lodge, PA-C  cefUROXime (CEFTIN) 500 MG tablet Take 1 tablet (500 mg total) by mouth 2 (two) times daily with a meal. X 1 WEEK Patient not taking: No sig reported 03/28/14   Rai, Ripudeep K, MD  fluticasone (FLONASE) 50 MCG/ACT nasal spray Place 1 spray into both nostrils daily. For allergies Patient not taking: No sig reported 03/28/14   Rai, Ripudeep K, MD  hydrALAZINE (APRESOLINE) 50 MG tablet Take 1 tablet (50 mg total) by mouth 2 (two) times daily. Patient not taking: No sig reported 03/28/14   Rai, Ripudeep  K, MD  Ipratropium-Albuterol (COMBIVENT) 20-100 MCG/ACT AERS respimat Inhale 1 puff into the lungs every 6 (six) hours. Patient not taking: No sig reported 09/08/18   Dartha Lodge, PA-C  loratadine (CLARITIN) 10 MG tablet Take 1 tablet (10 mg total) by mouth daily as needed for allergies. Patient not taking: No sig reported 03/28/14   Rai, Delene Ruffini, MD  meclizine (ANTIVERT) 25 MG tablet Take 1 tablet (25 mg total) by mouth 3 (three) times daily as needed for dizziness (also available over the counter). Patient not taking: No sig reported 03/28/14   Rai, Delene Ruffini, MD  UNABLE TO FIND Vestibular rehabilitation/occupational therapy     Diagnosis: Vertigo (BPPV) Patient not taking: No sig reported  03/28/14   Rai, Delene Ruffini, MD    Allergies    Lisinopril, Ace inhibitors, and Aleve [naproxen sodium]  Review of Systems   Review of Systems  Cardiovascular: Positive for chest pain.  All other systems reviewed and are negative.   Physical Exam Updated Vital Signs BP (!) 174/94   Pulse (!) 59   Temp 98 F (36.7 C) (Oral)   Resp (!) 22   SpO2 94%   Physical Exam Vitals and nursing note reviewed.  Constitutional:      Appearance: He is well-developed.  HENT:     Head: Normocephalic and atraumatic.  Eyes:     Extraocular Movements: Extraocular movements intact.     Pupils: Pupils are equal, round, and reactive to light.  Cardiovascular:     Rate and Rhythm: Normal rate and regular rhythm.     Heart sounds: Normal heart sounds.  Pulmonary:     Effort: Pulmonary effort is normal.     Breath sounds: Normal breath sounds.  Abdominal:     General: Bowel sounds are normal.     Palpations: Abdomen is soft.  Musculoskeletal:        General: Normal range of motion.     Cervical back: Normal range of motion and neck supple.  Skin:    General: Skin is warm.     Capillary Refill: Capillary refill takes less than 2 seconds.  Neurological:     General: No focal deficit present.     Mental Status: He is alert and oriented to person, place, and time.     ED Results / Procedures / Treatments   Labs (all labs ordered are listed, but only abnormal results are displayed) Labs Reviewed  COMPREHENSIVE METABOLIC PANEL - Abnormal; Notable for the following components:      Result Value   Glucose, Bld 108 (*)    Creatinine, Ser 0.58 (*)    All other components within normal limits  D-DIMER, QUANTITATIVE (NOT AT [facility]) - Abnormal; Notable for the following components:   D-Dimer, Quant 0.64 (*)    All other components within normal limits  RESP PANEL BY RT-PCR (FLU A&B, COVID) ARPGX2  CBC  TROPONIN I (HIGH SENSITIVITY)  TROPONIN I (HIGH SENSITIVITY)    EKG EKG  Interpretation  Date/Time:  Sunday November 07 2020 12:38:44 EST Ventricular Rate:  64 PR Interval:    QRS Duration: 115 QT Interval:  436 QTC Calculation: 450 R Axis:   -64 Text Interpretation: Sinus rhythm Incomplete RBBB and LAFB Probable anteroseptal infarct, old No significant change since last tracing Confirmed by Jacalyn Lefevre [phone]) on 11/07/2020 12:50:14 PM   Radiology CT Angio Chest PE W and/or Wo Contrast  Result Date: 11/07/2020 CLINICAL DATA:  75 y/o male from home after experiencing  left sided chest pain with radiation to arm while sitting in church. Pt states the pain came on while coughing or inhaling. EXAM: CT ANGIOGRAPHY CHEST WITH CONTRAST TECHNIQUE: Multidetector CT imaging of the chest was performed using the standard protocol during bolus administration of intravenous contrast. Multiplanar CT image reconstructions and MIPs were obtained to evaluate the vascular anatomy. CONTRAST:  58mL OMNIPAQUE IOHEXOL 350 MG/ML SOLN COMPARISON:  Current chest radiograph.  Chest CT, 01/28/2004 FINDINGS: Cardiovascular: Pulmonary arteries are well opacified. There is no evidence of a pulmonary embolism. Heart is normal in size and configuration. Three-vessel coronary artery calcifications. No pericardial effusion. Aorta is normal in caliber. Aortic atherosclerosis. No significant stenosis. No dissection. Mediastinum/Nodes: No enlarged mediastinal, hilar, or axillary lymph nodes. Thyroid gland, trachea, and esophagus demonstrate no significant findings. Lungs/Pleura: Minor linear atelectasis at the base of the right lower lobe. Lungs otherwise clear. No pleural effusion or pneumothorax. Mild centrilobular paraseptal emphysema in the upper lobes. Upper Abdomen: No acute abnormality. Musculoskeletal: Minor compression deformity of T7, which appears chronic. No evidence of acute fracture. No bone lesion. Review of the MIP images confirms the above findings. IMPRESSION: 1. No evidence of a pulmonary  embolism. 2. No acute findings. 3. Minor right lower lobe dependent atelectasis. 4. Emphysema and aortic atherosclerosis well as three-vessel coronary artery calcifications. Aortic Atherosclerosis (ICD10-I70.0) and Emphysema (ICD10-J43.9). Electronically Signed   By: Amie Portland M.D.   On: 11/07/2020 15:34   DG Chest Port 1 View  Result Date: 11/07/2020 CLINICAL DATA:  Acute chest pain at church EXAM: PORTABLE CHEST 1 VIEW COMPARISON:  09/08/2018 FINDINGS: Hyperinflation compatible with background COPD/emphysema. Normal heart size and vascularity. Minor right base atelectasis or scarring. No acute pneumonia, collapse or consolidation. Negative for edema, effusion or pneumothorax. Trachea midline. Aorta atherosclerotic. Degenerative changes of the spine. IMPRESSION: COPD/emphysema. Minor right base atelectasis or scarring No acute process by plain radiography Aortic Atherosclerosis (ICD10-I70.0). Electronically Signed   By: Judie Petit.  Shick M.D.   On: 11/07/2020 13:15    Procedures Procedures   Medications Ordered in ED Medications  iohexol (OMNIPAQUE) 350 MG/ML injection 70 mL (70 mLs Intravenous Contrast Given 11/07/20 1517)    ED Course  I have reviewed the triage vital signs and the nursing notes.  Pertinent labs & imaging results that were available during my care of the patient were reviewed by me and considered in my medical decision making (see chart for details).    MDM Rules/Calculators/A&P                          Heart score of 5  Initial troponin ok.  CT chest shows 3 vessel coronary artery calcifications.  He has not had any kind of cardiac eval for several years.  Final Clinical Impression(s) / ED Diagnoses Final diagnoses:  Unstable angina (HCC)  Primary hypertension  Tobacco abuse    Rx / DC Orders ED Discharge Orders    None       Jacalyn Lefevre, MD 11/07/20 1621

## 2020-11-07 NOTE — Plan of Care (Signed)

## 2020-11-08 ENCOUNTER — Observation Stay (HOSPITAL_BASED_OUTPATIENT_CLINIC_OR_DEPARTMENT_OTHER): Payer: Medicare Other

## 2020-11-08 ENCOUNTER — Observation Stay (HOSPITAL_COMMUNITY): Payer: Medicare Other

## 2020-11-08 DIAGNOSIS — R079 Chest pain, unspecified: Secondary | ICD-10-CM | POA: Diagnosis not present

## 2020-11-08 DIAGNOSIS — Z720 Tobacco use: Secondary | ICD-10-CM | POA: Diagnosis not present

## 2020-11-08 DIAGNOSIS — I10 Essential (primary) hypertension: Secondary | ICD-10-CM | POA: Diagnosis not present

## 2020-11-08 DIAGNOSIS — R931 Abnormal findings on diagnostic imaging of heart and coronary circulation: Secondary | ICD-10-CM | POA: Diagnosis not present

## 2020-11-08 DIAGNOSIS — I200 Unstable angina: Secondary | ICD-10-CM | POA: Diagnosis not present

## 2020-11-08 LAB — ECHOCARDIOGRAM COMPLETE
Area-P 1/2: 1.41 cm2
Height: 72 in
S' Lateral: 3.4 cm
Weight: 3058.22 oz

## 2020-11-08 MED ORDER — LABETALOL HCL 5 MG/ML IV SOLN: 5.0000 mg | INTRAVENOUS | Status: DC | PRN

## 2020-11-08 MED ORDER — HYDRALAZINE HCL 50 MG PO TABS
75.0000 mg | ORAL_TABLET | Freq: Two times a day (BID) | ORAL | Status: DC
  Administered 2020-11-08 – 2020-11-09 (×2): 75 mg via ORAL
  Filled 2020-11-08 (×2): qty 1

## 2020-11-08 MED ORDER — ATORVASTATIN CALCIUM 10 MG PO TABS
20.0000 mg | ORAL_TABLET | Freq: Every day | ORAL | Status: DC
  Administered 2020-11-08 – 2020-11-09 (×2): 20 mg via ORAL
  Filled 2020-11-08 (×2): qty 2

## 2020-11-08 MED ORDER — NITROGLYCERIN 0.4 MG SL SUBL
0.8000 mg | SUBLINGUAL_TABLET | Freq: Once | SUBLINGUAL | Status: AC
  Administered 2020-11-08: 0.8 mg via SUBLINGUAL

## 2020-11-08 MED ORDER — IOHEXOL 350 MG/ML SOLN
80.0000 mL | Freq: Once | INTRAVENOUS | Status: AC | PRN
  Administered 2020-11-08: 80 mL via INTRAVENOUS

## 2020-11-08 MED ORDER — NITROGLYCERIN 0.4 MG SL SUBL
SUBLINGUAL_TABLET | SUBLINGUAL | Status: AC
  Filled 2020-11-08: qty 1

## 2020-11-08 MED ORDER — HYDRALAZINE HCL 50 MG PO TABS
50.0000 mg | ORAL_TABLET | Freq: Two times a day (BID) | ORAL | Status: DC
  Administered 2020-11-08: 50 mg via ORAL
  Filled 2020-11-08: qty 1

## 2020-11-08 NOTE — Progress Notes (Signed)
  Echocardiogram 2D Echocardiogram has been performed.  Jacob Ruiz 11/08/2020, 9:27 AM

## 2020-11-08 NOTE — Progress Notes (Signed)
Progress Note    Jacob Ruiz  UXL:[id number] DOB: 04/10/1946  DOA: 11/07/2020 PCP: Sigmund HazelMiller, Lisa, MD    Brief Narrative:    Medical records reviewed and are as summarized below:  Jacob Ruiz is an 75 y.o. male with medical history significant of HTN noncompliant with BP meds, COPD, cigarette smoker, presented with new onset of chest pains. Patient also remembered that he had a similar episode 3 to 4 weeks ago, when he was at work and was emotional and all of sudden started to have sharp like chest pain on same spot but not irradiating and subsided by its own in that episode.  Assessment/Plan:   Active Problems:   Ischemic chest pain (HCC)   Chest pain   Angina like chest pain -Appears to have 3 vessel lesions CTA to r/o PE.  Dr. Chipper HerbZhang Discussed with on-call cardiology, who recommended a dedicated CTA tomorrow to rule out CAD. And contact cardiology any significant finding of FFR on CTA tomorrow-- await FFR -Echo: Left ventricular ejection fraction, by estimation, is 60 to 65%. The  left ventricle has normal function. The left ventricle has no regional  wall motion abnormalities. There is mild left ventricular hypertrophy.  Left ventricular diastolic parameters  are consistent with Grade I diastolic dysfunction (impaired relaxation). -Educated to quit smoking -LDL: 118  HTN uncontrolled, noncompliant with BP meds -Start amlodipine, patient allergic to ACEI -HR in upper 50s  COPD -No symptoms signs of acute exacerbation -As needed albuterol  Cigar smoker -Nicotine patch and educated to quit.   Family Communication/Anticipated D/C date and plan/Code Status   DVT prophylaxis: Lovenox ordered. Code Status: Full Code.  Family Communication: wife at bedside Disposition Plan: Status is: Observation  The patient remains OBS appropriate   Dispo: The patient is from: Home              Anticipated d/c is to: Home              Anticipated d/c date is: 1 day               Patient currently is not medically stable to d/c.   await FFR   Difficult to place patient No         Medical Consultants:    Cards (phone in ER)     Subjective:  Anxious to go home  Objective:    Vitals:   11/08/20 0900 11/08/20 1249 11/08/20 1430 11/08/20 1445  BP: (!) 163/65 (!) 160/81 131/87 (!) 155/77  Pulse:  65    Resp: (!) 21 19 20 19   Temp:  97.9 F (36.6 C)    TempSrc:  Oral    SpO2:      Weight:      Height:        Intake/Output Summary (Last 24 hours) at 11/08/2020 1544 Last data filed at 11/08/2020 0730 Gross per 24 hour  Intake 2720 ml  Output -  Net 2720 ml   Filed Weights   11/07/20 1700 11/07/20 1938  Weight: 87.5 kg 86.7 kg    Exam: On side of bed with pants and shoes on No increased work of breathing Mildly hard of hearing  Data Reviewed:   I have personally reviewed following labs and imaging studies:  Labs: Labs show the following:   Basic Metabolic Panel: Recent Labs  Lab 11/07/20 1230  NA 137  K 4.0  CL 102  CO2 23  GLUCOSE 108*  BUN 9  CREATININE  0.58*  CALCIUM 9.0   GFR Estimated Creatinine Clearance: 88.9 mL/min (A) (by C-G formula based on SCr of 0.58 mg/dL (L)). Liver Function Tests: Recent Labs  Lab 11/07/20 1230  AST 15  ALT 13  ALKPHOS 72  BILITOT 0.8  PROT 7.0  ALBUMIN 4.1   No results for input(s): LIPASE, AMYLASE in the last 168 hours. No results for input(s): AMMONIA in the last 168 hours. Coagulation profile No results for input(s): INR, PROTIME in the last 168 hours.  CBC: Recent Labs  Lab 11/07/20 1230  WBC 9.4  HGB 14.2  HCT 43.8  MCV 92.6  PLT 218   Cardiac Enzymes: No results for input(s): CKTOTAL, CKMB, CKMBINDEX, TROPONINI in the last 168 hours. BNP (last 3 results) No results for input(s): PROBNP in the last 8760 hours. CBG: No results for input(s): GLUCAP in the last 168 hours. D-Dimer: Recent Labs    11/07/20 1230  DDIMER 0.64*   Hgb A1c: No results for  input(s): HGBA1C in the last 72 hours. Lipid Profile: Recent Labs    11/07/20 1550  CHOL 197  HDL 64  LDLCALC 118*  TRIG 76  CHOLHDL 3.1   Thyroid function studies: No results for input(s): TSH, T4TOTAL, T3FREE, THYROIDAB in the last 72 hours.  Invalid input(s): FREET3 Anemia work up: No results for input(s): VITAMINB12, FOLATE, FERRITIN, TIBC, IRON, RETICCTPCT in the last 72 hours. Sepsis Labs: Recent Labs  Lab 11/07/20 1230  WBC 9.4    Microbiology Recent Results (from the past 240 hour(s))  Resp Panel by RT-PCR (Flu A&B, Covid) Nasopharyngeal Swab     Status: None   Collection Time: 11/07/20  1:00 PM   Specimen: Nasopharyngeal Swab; Nasopharyngeal(NP) swabs in vial transport medium  Result Value Ref Range Status   SARS Coronavirus 2 by RT PCR NEGATIVE NEGATIVE Final    Comment: (NOTE) SARS-CoV-2 target nucleic acids are NOT DETECTED.  The SARS-CoV-2 RNA is generally detectable in upper respiratory specimens during the acute phase of infection. The lowest concentration of SARS-CoV-2 viral copies this assay can detect is 138 copies/mL. A negative result does not preclude SARS-Cov-2 infection and should not be used as the sole basis for treatment or other patient management decisions. A negative result may occur with  improper specimen collection/handling, submission of specimen other than nasopharyngeal swab, presence of viral mutation(s) within the areas targeted by this assay, and inadequate number of viral copies(<138 copies/mL). A negative result must be combined with clinical observations, patient history, and epidemiological information. The expected result is Negative.  Fact Sheet for Patients:  BloggerCourse.com  Fact Sheet for Healthcare Providers:  SeriousBroker.it  This test is no t yet approved or cleared by the [place] FDA and  has been authorized for detection and/or diagnosis of SARS-CoV-2  by FDA under an Emergency Use Authorization (EUA). This EUA will remain  in effect (meaning this test can be used) for the duration of the COVID-19 declaration under Section 564(b)(1) of the Act, 21 U.S.C.section 360bbb-3(b)(1), unless the authorization is terminated  or revoked sooner.       Influenza A by PCR NEGATIVE NEGATIVE Final   Influenza B by PCR NEGATIVE NEGATIVE Final    Comment: (NOTE) The Xpert Xpress SARS-CoV-2/FLU/RSV plus assay is intended as an aid in the diagnosis of influenza from Nasopharyngeal swab specimens and should not be used as a sole basis for treatment. Nasal washings and aspirates are unacceptable for Xpert Xpress SARS-CoV-2/FLU/RSV testing.  Fact Sheet for Patients: BloggerCourse.com  Fact Sheet for Healthcare Providers: SeriousBroker.it  This test is not yet approved or cleared by the [place] FDA and has been authorized for detection and/or diagnosis of SARS-CoV-2 by FDA under an Emergency Use Authorization (EUA). This EUA will remain in effect (meaning this test can be used) for the duration of the COVID-19 declaration under Section 564(b)(1) of the Act, 21 U.S.C. section 360bbb-3(b)(1), unless the authorization is terminated or revoked.  Performed at [facility], [address] [place], [place] [address]   MRSA PCR Screening     Status: None   Collection Time: 11/07/20  8:08 PM   Specimen: Nasal Mucosa; Nasopharyngeal  Result Value Ref Range Status   MRSA by PCR NEGATIVE NEGATIVE Final    Comment:        The GeneXpert MRSA Assay (FDA approved for NASAL specimens only), is one component of a comprehensive MRSA colonization surveillance program. It is not intended to diagnose MRSA infection nor to guide or monitor treatment for MRSA infections. Performed at [facility], [address] [place], [place] [address]     Procedures and diagnostic studies:  CT Angio  Chest PE W and/or Wo Contrast  Result Date: 11/07/2020 CLINICAL DATA:  75 y/o male from home after experiencing left sided chest pain with radiation to arm while sitting in church. Pt states the pain came on while coughing or inhaling. EXAM: CT ANGIOGRAPHY CHEST WITH CONTRAST TECHNIQUE: Multidetector CT imaging of the chest was performed using the standard protocol during bolus administration of intravenous contrast. Multiplanar CT image reconstructions and MIPs were obtained to evaluate the vascular anatomy. CONTRAST:  38mL OMNIPAQUE IOHEXOL 350 MG/ML SOLN COMPARISON:  Current chest radiograph.  Chest CT, 01/28/2004 FINDINGS: Cardiovascular: Pulmonary arteries are well opacified. There is no evidence of a pulmonary embolism. Heart is normal in size and configuration. Three-vessel coronary artery calcifications. No pericardial effusion. Aorta is normal in caliber. Aortic atherosclerosis. No significant stenosis. No dissection. Mediastinum/Nodes: No enlarged mediastinal, hilar, or axillary lymph nodes. Thyroid gland, trachea, and esophagus demonstrate no significant findings. Lungs/Pleura: Minor linear atelectasis at the base of the right lower lobe. Lungs otherwise clear. No pleural effusion or pneumothorax. Mild centrilobular paraseptal emphysema in the upper lobes. Upper Abdomen: No acute abnormality. Musculoskeletal: Minor compression deformity of T7, which appears chronic. No evidence of acute fracture. No bone lesion. Review of the MIP images confirms the above findings. IMPRESSION: 1. No evidence of a pulmonary embolism. 2. No acute findings. 3. Minor right lower lobe dependent atelectasis. 4. Emphysema and aortic atherosclerosis well as three-vessel coronary artery calcifications. Aortic Atherosclerosis (ICD10-I70.0) and Emphysema (ICD10-J43.9). Electronically Signed   By: Amie Portland M.D.   On: 11/07/2020 15:34   CT CORONARY MORPH W/CTA COR W/SCORE W/CA W/CM &/OR WO/CM  Addendum Date: 11/08/2020    ADDENDUM REPORT: 11/08/2020 15:06 CLINICAL DATA:  Chest pain EXAM: Cardiac CTA MEDICATIONS: Sub lingual nitro. 4mg  x 2 TECHNIQUE: The patient was scanned on a Siemens 192 slice scanner. Gantry rotation speed was 250 msecs. Collimation was 0.6 mm. A 100 kV prospective scan was triggered in the ascending thoracic aorta at 35-75% of the R-R interval. Average HR during the scan was 60 bpm. The 3D data set was interpreted on a dedicated work station using MPR, MIP and VRT modes. A total of 80cc of contrast was used. FINDINGS: Non-cardiac: See separate report from [facility]. Pulmonary veins drain normally to the left atrium. No LA appendage thrombus. Calcium Score: 4,057 Agatston units.  Coronary Arteries: Right dominant with no anomalies LM: Calcified plaque distal vessel, mild (<50%) stenosis. LAD system: Large high D1 with extensive calcified plaque proximally, probably mild (<50%) stenosis. Extensive calcified plaque in the proximal to mid LAD. Difficult to grade stenosis due to blooming artifact, but probably mild (<50%) stenosis. Circumflex system: Extensive calcified plaque in the proximal and mid vessel. Difficult to comment on degree of stenosis due to blooming artifact. RCA system: Extensive calcified plaque throughout the RCA. Suspect no more than mild (<50%) stenosis. Extensive calcified plaque throughout the PLV branch, suspect mild (<50%) stenosis though small distal branches were still diseased but not well-visualized. The ostial PDA is calcified, cannot comment on degree of stenosis due to blooming artifact. The remainder of the proximal-mid PDA has mild (<50%) stenosis. The distal branches were still diseased but not well-visualized. IMPRESSION: 1. Coronary artery calcium score 4057 Agatston units. This places the patient in the 97th percentile for age and gender, suggesting high risk for future cardiac events. 2. Very difficult study to interpret due to very high calcium score. 3. Suspect  there is not hemodynamically significant stenosis in the RCA, but difficult to comment on the more distal PDA and PLV due to blooming artifact. 4.  There does not appear to be severe disease in the LAD system. 5. Difficult to comment on proximal to mid LCX due to extensive blooming artifact. Will send for FFR. Dalton Mclean Electronically Signed   By: Marca Ancona M.D.   On: 11/08/2020 15:06   Result Date: 11/08/2020 EXAM: OVER-READ INTERPRETATION  CT CHEST The following report is an over-read performed by radiologist Dr. Trudie Reed of [facility] on 11/08/2020. This over-read does not include interpretation of cardiac or coronary anatomy or pathology. The coronary calcium score/coronary CTA interpretation by the cardiologist is attached. COMPARISON:  Chest CTA 11/07/2020. FINDINGS: Aortic atherosclerosis. Within the visualized portions of the thorax there are no suspicious appearing pulmonary nodules or masses, there is no acute consolidative airspace disease, no pleural effusions, no pneumothorax and no lymphadenopathy. Visualized portions of the upper abdomen are unremarkable. There are no aggressive appearing lytic or blastic lesions noted in the visualized portions of the skeleton. IMPRESSION: 1.  Aortic Atherosclerosis (ICD10-I70.0). Electronically Signed: By: Trudie Reed M.D. On: 11/08/2020 14:13   DG Chest Port 1 View  Result Date: 11/07/2020 CLINICAL DATA:  Acute chest pain at church EXAM: PORTABLE CHEST 1 VIEW COMPARISON:  09/08/2018 FINDINGS: Hyperinflation compatible with background COPD/emphysema. Normal heart size and vascularity. Minor right base atelectasis or scarring. No acute pneumonia, collapse or consolidation. Negative for edema, effusion or pneumothorax. Trachea midline. Aorta atherosclerotic. Degenerative changes of the spine. IMPRESSION: COPD/emphysema. Minor right base atelectasis or scarring No acute process by plain radiography Aortic Atherosclerosis  (ICD10-I70.0). Electronically Signed   By: Judie Petit.  Shick M.D.   On: 11/07/2020 13:15   ECHOCARDIOGRAM COMPLETE  Result Date: 11/08/2020    ECHOCARDIOGRAM REPORT   Patient Name:   Jacob Ruiz Date of Exam: 11/08/2020 Medical Rec #:  [id number]       Height:       72.0 in Accession #:    [id number]      Weight:       191.1 lb Date of Birth:  June 18, 1946       BSA:          2.090 m Patient Age:    74 years        BP:  155/82 mmHg Patient Gender: M               HR:           61 bpm. Exam Location:  Inpatient Procedure: 2D Echo, Cardiac Doppler and Color Doppler Indications:    R07.9* Chest pain, unspecified  History:        Patient has no prior history of Echocardiogram examinations.                 Signs/Symptoms:Chest Pain.  Sonographer:    Eulah Pont RDCS Referring Phys: [id number] Emeline General IMPRESSIONS  1. Left ventricular ejection fraction, by estimation, is 60 to 65%. The left ventricle has normal function. The left ventricle has no regional wall motion abnormalities. There is mild left ventricular hypertrophy. Left ventricular diastolic parameters are consistent with Grade I diastolic dysfunction (impaired relaxation).  2. Right ventricular systolic function is normal. The right ventricular size is normal. Tricuspid regurgitation signal is inadequate for assessing PA pressure.  3. The mitral valve is normal in structure. Trivial mitral valve regurgitation. No evidence of mitral stenosis.  4. The aortic valve was not well visualized. Aortic valve regurgitation is not visualized. Mild aortic valve sclerosis is present, with no evidence of aortic valve stenosis.  5. The inferior vena cava is normal in size with greater than 50% respiratory variability, suggesting right atrial pressure of 3 mmHg.  6. Technically difficult study with poor acoustic windows. FINDINGS  Left Ventricle: Left ventricular ejection fraction, by estimation, is 60 to 65%. The left ventricle has normal function. The left ventricle  has no regional wall motion abnormalities. The left ventricular internal cavity size was normal in size. There is  mild left ventricular hypertrophy. Left ventricular diastolic parameters are consistent with Grade I diastolic dysfunction (impaired relaxation). Right Ventricle: The right ventricular size is normal. No increase in right ventricular wall thickness. Right ventricular systolic function is normal. Tricuspid regurgitation signal is inadequate for assessing PA pressure. Left Atrium: Left atrial size was normal in size. Right Atrium: Right atrial size was normal in size. Pericardium: There is no evidence of pericardial effusion. Mitral Valve: The mitral valve is normal in structure. Trivial mitral valve regurgitation. No evidence of mitral valve stenosis. Tricuspid Valve: The tricuspid valve is normal in structure. Tricuspid valve regurgitation is trivial. Aortic Valve: The aortic valve was not well visualized. Aortic valve regurgitation is not visualized. Mild aortic valve sclerosis is present, with no evidence of aortic valve stenosis. Pulmonic Valve: The pulmonic valve was not well visualized. Pulmonic valve regurgitation is not visualized. Aorta: The aortic root is normal in size and structure. Venous: The inferior vena cava is normal in size with greater than 50% respiratory variability, suggesting right atrial pressure of 3 mmHg. IAS/Shunts: No atrial level shunt detected by color flow Doppler.  LEFT VENTRICLE PLAX 2D LVIDd:         4.80 cm  Diastology LVIDs:         3.40 cm  LV e' medial:    7.18 cm/s LV PW:         0.80 cm  LV E/e' medial:  6.6 LV IVS:        0.80 cm  LV e' lateral:   8.27 cm/s LVOT diam:     2.00 cm  LV E/e' lateral: 5.7 LV SV:         72 LV SV Index:   34 LVOT Area:     3.14 cm  RIGHT VENTRICLE RV  S prime:     17.40 cm/s TAPSE (M-mode): 2.8 cm LEFT ATRIUM             Index       RIGHT ATRIUM           Index LA diam:        3.10 cm 1.48 cm/m  RA Area:     15.00 cm LA Vol (A2C):    46.0 ml 22.01 ml/m RA Volume:   35.90 ml  17.18 ml/m LA Vol (A4C):   40.8 ml 19.52 ml/m LA Biplane Vol: 44.0 ml 21.05 ml/m  AORTIC VALVE LVOT Vmax:   102.00 cm/s LVOT Vmean:  68.700 cm/s LVOT VTI:    0.229 m  AORTA Ao Root diam: 3.20 cm MITRAL VALVE MV Area (PHT): 1.41 cm    SHUNTS MV Decel Time: 539 msec    Systemic VTI:  0.23 m MV E velocity: 47.30 cm/s  Systemic Diam: 2.00 cm MV A velocity: 60.00 cm/s MV E/A ratio:  0.79 Marca Ancona MD Electronically signed by Marca Ancona MD Signature Date/Time: 11/08/2020/2:05:43 PM    Final     Medications:   . amLODipine  5 mg Oral Daily  . enoxaparin (LOVENOX) injection  40 mg Subcutaneous Q24H  . hydrALAZINE  50 mg Oral BID  . nicotine  21 mg Transdermal Daily   Continuous Infusions:   LOS: 0 days   Joseph Art  [facility]   How to contact the [facility] Attending or Consulting provider 7A - 7P or covering provider during after hours 7P -7A, for this patient?  1. Check the care team in [facility] and look for a) attending/consulting [facility] provider listed and b) the [facility] team listed 2. Log into [email or web address] and use [place]'s universal password to access. If you do not have the password, please contact the hospital operator. 3. Locate the [facility] provider you are looking for under [facility] and page to a number that you can be directly reached. 4. If you still have difficulty reaching the provider, please page the [facility] (Director on Call) for the Hospitalists listed on amion for assistance.  11/08/2020, 3:44 PM

## 2020-11-08 NOTE — Discharge Instructions (Signed)
Managing the Challenge of Quitting Smoking Quitting smoking is a physical and mental challenge. You will face cravings, withdrawal symptoms, and temptation. Before quitting, work with your health care provider to make a plan that can help you manage quitting. Preparation can help you quit and keep you from giving in. How to manage lifestyle changes Managing stress Stress can make you want to smoke, and wanting to smoke may cause stress. It is important to find ways to manage your stress. You might try some of the following:  Practice relaxation techniques. ? Breathe slowly and deeply, in through your nose and out through your mouth. ? Listen to music. ? Soak in a bath or take a shower. ? Imagine a peaceful place or vacation.  Get some support. ? Talk with family or friends about your stress. ? Join a support group. ? Talk with a counselor or therapist.  Get some physical activity. ? Go for a walk, run, or bike ride. ? Play a favorite sport. ? Practice yoga.   Medicines Talk with your health care provider about medicines that might help you deal with cravings and make quitting easier for you. Relationships Social situations can be difficult when you are quitting smoking. To manage this, you can:  Avoid parties and other social situations where people might be smoking.  Avoid alcohol.  Leave right away if you have the urge to smoke.  Explain to your family and friends that you are quitting smoking. Ask for support and let them know you might be a bit grumpy.  Plan activities where smoking is not an option. General instructions Be aware that many people gain weight after they quit smoking. However, not everyone does. To keep from gaining weight, have a plan in place before you quit and stick to the plan after you quit. Your plan should include:  Having healthy snacks. When you have a craving, it may help to: ? Eat popcorn, carrots, celery, or other cut vegetables. ? Chew  sugar-free gum.  Changing how you eat. ? Eat small portion sizes at meals. ? Eat 4-6 small meals throughout the day instead of 1-2 large meals a day. ? Be mindful when you eat. Do not watch television or do other things that might distract you as you eat.  Exercising regularly. ? Make time to exercise each day. If you do not have time for a long workout, do short bouts of exercise for 5-10 minutes several times a day. ? Do some form of strengthening exercise, such as weight lifting. ? Do some exercise that gets your heart beating and causes you to breathe deeply, such as walking fast, running, swimming, or biking. This is very important.  Drinking plenty of water or other low-calorie or no-calorie drinks. Drink 6-8 glasses of water daily.   How to recognize withdrawal symptoms Your body and mind may experience discomfort as you try to get used to not having nicotine in your system. These effects are called withdrawal symptoms. They may include:  Feeling hungrier than normal.  Having trouble concentrating.  Feeling irritable or restless.  Having trouble sleeping.  Feeling depressed.  Craving a cigarette. To manage withdrawal symptoms:  Avoid places, people, and activities that trigger your cravings.  Remember why you want to quit.  Get plenty of sleep.  Avoid coffee and other caffeinated drinks. These may worsen some of your symptoms. These symptoms may surprise you. But be assured that they are normal to have when quitting smoking. How to manage cravings  Come up with a plan for how to deal with your cravings. The plan should include the following:  A definition of the specific situation you want to deal with.  An alternative action you will take.  A clear idea for how this action will help.  The name of someone who might help you with this. Cravings usually last for 5-10 minutes. Consider taking the following actions to help you with your plan to deal with  cravings:  Keep your mouth busy. ? Chew sugar-free gum. ? Suck on hard candies or a straw. ? Brush your teeth.  Keep your hands and body busy. ? Change to a different activity right away. ? Squeeze or play with a ball. ? Do an activity or a hobby, such as making bead jewelry, practicing needlepoint, or working with wood. ? Mix up your normal routine. ? Take a short exercise break. Go for a quick walk or run up and down stairs.  Focus on doing something kind or helpful for someone else.  Call a friend or family member to talk during a craving.  Join a support group.  Contact a quitline. Where to find support To get help or find a support group:  Call the [facility]'s Smoking Quitline: 1-800-QUIT NOW 972-044-5849)  Visit the website of the Substance Abuse and [place]: ktimeonline.com  Text QUIT to SmokefreeTXT: 270623 Where to find more information Visit these websites to find more information on quitting smoking:  [place]: www.smokefree.gov  American Lung Association: www.lung.org  American Cancer Society: www.cancer.org  Centers for Disease Control and Prevention: http://www.wolf.info/  American Heart Association: www.heart.org Contact a health care provider if:  You want to change your plan for quitting.  The medicines you are taking are not helping.  Your eating feels out of control or you cannot sleep. Get help right away if:  You feel depressed or become very anxious. Summary  Quitting smoking is a physical and mental challenge. You will face cravings, withdrawal symptoms, and temptation to smoke again. Preparation can help you as you go through these challenges.  Try different techniques to manage stress, handle social situations, and prevent weight gain.  You can deal with cravings by keeping your mouth busy (such as by chewing gum), keeping your hands and body busy, calling family or friends, or  contacting a quitline for people who want to quit smoking.  You can deal with withdrawal symptoms by avoiding places where people smoke, getting plenty of rest, and avoiding drinks with caffeine. This information is not intended to replace advice given to you by your health care provider. Make sure you discuss any questions you have with your health care provider. Document Revised: 07/01/2019 Document Reviewed: 07/01/2019 Elsevier Patient Education  2021 [place] [employer] family medicine (9th ed., pp. [phone]). [place], [place]: [employer].">  Stress, Adult Stress is a normal reaction to life events. Stress is what you feel when life demands more than you are used to, or more than you think you can handle. Some stress can be useful, such as studying for a test or meeting a deadline at work. Stress that occurs too often or for too long can cause problems. It can affect your emotional health and interfere with relationships and normal daily activities. Too much stress can weaken your body's defense system (immune system) and increase your risk for physical illness. If you already have a medical problem, stress can make it worse. What are the causes? All sorts  of life events can cause stress. An event that causes stress for one person may not be stressful for another person. Major life events, whether positive or negative, commonly cause stress. Examples include:  Losing a job or starting a new job.  Losing a loved one.  Moving to a new town or home.  Getting married or divorced.  Having a baby.  Getting injured or sick. Less obvious life events can also cause stress, especially if they occur day after day or in combination with each other. Examples include:  Working long hours.  Driving in traffic.  Caring for children.  Being in debt.  Being in a difficult relationship. What are the signs or symptoms? Stress can cause emotional symptoms, including:  Anxiety. This is  feeling worried, afraid, on edge, overwhelmed, or out of control.  Anger, including irritation or impatience.  Depression. This is feeling sad, down, helpless, or guilty.  Trouble focusing, remembering, or making decisions. Stress can cause physical symptoms, including:  Aches and pains. These may affect your head, neck, back, stomach, or other areas of your body.  Tight muscles or a clenched jaw.  Low energy.  Trouble sleeping. Stress can cause unhealthy behaviors, including:  Eating to feel better (overeating) or skipping meals.  Working too much or putting off tasks.  Smoking, drinking alcohol, or using drugs to feel better. How is this diagnosed? Stress is diagnosed through an assessment by your health care provider. He or she may diagnose this condition based on:  Your symptoms and any stressful life events.  Your medical history.  Tests to rule out other causes of your symptoms. Depending on your condition, your health care provider may refer you to a specialist for further evaluation. How is this treated? Stress management techniques are the recommended treatment for stress. Medicine is not typically recommended for the treatment of stress. Techniques to reduce your reaction to stressful life events include:  Stress identification. Monitor yourself for symptoms of stress and identify what causes stress for you. These skills may help you to avoid or prepare for stressful events.  Time management. Set your priorities, keep a calendar of events, and learn to say no. Taking these actions can help you avoid making too many commitments. Techniques for coping with stress include:  Rethinking the problem. Try to think realistically about stressful events rather than ignoring them or overreacting. Try to find the positives in a stressful situation rather than focusing on the negatives.  Exercise. Physical exercise can release both physical and emotional tension. The key is to  find a form of exercise that you enjoy and do it regularly.  Relaxation techniques. These relax the body and mind. The key is to find one or more that you enjoy and use the techniques regularly. Examples include: ? Meditation, deep breathing, or progressive relaxation techniques. ? Yoga or tai chi. ? Biofeedback, mindfulness techniques, or journaling. ? Listening to music, being out in nature, or participating in other hobbies.  Practicing a healthy lifestyle. Eat a balanced diet, drink plenty of water, limit or avoid caffeine, and get plenty of sleep.  Having a strong support network. Spend time with family, friends, or other people you enjoy being around. Express your feelings and talk things over with someone you trust. Counseling or talk therapy with a mental health professional may be helpful if you are having trouble managing stress on your own.   Follow these instructions at home: Lifestyle  Avoid drugs.  Do not use any products  that contain nicotine or tobacco, such as cigarettes, e-cigarettes, and chewing tobacco. If you need help quitting, ask your health care provider.  Limit alcohol intake to no more than 1 drink a day for nonpregnant women and 2 drinks a day for men. One drink equals 12 oz of beer, 5 oz of wine, or 1 oz of hard liquor  Do not use alcohol or drugs to relax.  Eat a balanced diet that includes fresh fruits and vegetables, whole grains, lean meats, fish, eggs, and beans, and low-fat dairy. Avoid processed foods and foods high in added fat, sugar, and salt.  Exercise at least 30 minutes on 5 or more days each week.  Get 7-8 hours of sleep each night.   General instructions  Practice stress management techniques as discussed with your health care provider.  Drink enough fluid to keep your urine clear or pale yellow.  Take over-the-counter and prescription medicines only as told by your health care provider.  Keep all follow-up visits as told by your health  care provider. This is important.   Contact a health care provider if:  Your symptoms get worse.  You have new symptoms.  You feel overwhelmed by your problems and can no longer manage them on your own. Get help right away if:  You have thoughts of hurting yourself or others. If you ever feel like you may hurt yourself or others, or have thoughts about taking your own life, get help right away. You can go to your nearest emergency department or call:  Your local emergency services (911 in the [place]  A suicide crisis helpline, such as the [place] at (515)757-1856. This is open 24 hours a day. Summary  Stress is a normal reaction to life events. It can cause problems if it happens too often or for too long.  Practicing stress management techniques is the best way to treat stress.  Counseling or talk therapy with a mental health professional may be helpful if you are having trouble managing stress on your own. This information is not intended to replace advice given to you by your health care provider. Make sure you discuss any questions you have with your health care provider. Document Revised: 05/28/2020 Document Reviewed: 05/28/2020 Elsevier Patient Education  2021 [employer].

## 2020-11-08 NOTE — Plan of Care (Signed)

## 2020-11-09 ENCOUNTER — Encounter (HOSPITAL_COMMUNITY): Payer: Self-pay | Admitting: Internal Medicine

## 2020-11-09 DIAGNOSIS — R079 Chest pain, unspecified: Secondary | ICD-10-CM | POA: Diagnosis not present

## 2020-11-09 DIAGNOSIS — I10 Essential (primary) hypertension: Secondary | ICD-10-CM | POA: Diagnosis not present

## 2020-11-09 DIAGNOSIS — Z720 Tobacco use: Secondary | ICD-10-CM | POA: Diagnosis not present

## 2020-11-09 DIAGNOSIS — I200 Unstable angina: Secondary | ICD-10-CM | POA: Diagnosis not present

## 2020-11-09 DIAGNOSIS — R931 Abnormal findings on diagnostic imaging of heart and coronary circulation: Secondary | ICD-10-CM | POA: Diagnosis not present

## 2020-11-09 MED ORDER — HYDRALAZINE HCL 25 MG PO TABS: 25.0000 mg | ORAL_TABLET | Freq: Three times a day (TID) | ORAL | Status: DC | PRN

## 2020-11-09 MED ORDER — METOPROLOL SUCCINATE ER 25 MG PO TB24: 25.0000 mg | ORAL_TABLET | Freq: Every day | ORAL | 1 refills | Status: DC

## 2020-11-09 MED ORDER — ATORVASTATIN CALCIUM 20 MG PO TABS: 20.0000 mg | ORAL_TABLET | Freq: Every day | ORAL | 1 refills | Status: DC

## 2020-11-09 MED ORDER — ISOSORBIDE MONONITRATE ER 30 MG PO TB24: 30.0000 mg | ORAL_TABLET | Freq: Every day | ORAL | 0 refills | Status: DC

## 2020-11-09 MED ORDER — AMLODIPINE BESYLATE 5 MG PO TABS: 5.0000 mg | ORAL_TABLET | Freq: Every day | ORAL | 1 refills | Status: DC

## 2020-11-09 MED ORDER — ASPIRIN EC 81 MG PO TBEC: 81.0000 mg | DELAYED_RELEASE_TABLET | Freq: Every day | ORAL | 2 refills | Status: DC

## 2020-11-09 MED ORDER — ISOSORBIDE MONONITRATE ER 30 MG PO TB24: 30.0000 mg | ORAL_TABLET | Freq: Every day | ORAL | Status: DC

## 2020-11-09 MED ORDER — METOPROLOL SUCCINATE ER 25 MG PO TB24: 25.0000 mg | ORAL_TABLET | Freq: Every day | ORAL | Status: DC

## 2020-11-09 MED ORDER — NICOTINE 21 MG/24HR TD PT24: 21.0000 mg | MEDICATED_PATCH | Freq: Every day | TRANSDERMAL | 0 refills | Status: DC

## 2020-11-09 NOTE — Progress Notes (Signed)
Progress Note    Jacob Ruiz  QAS:[id number] DOB: [birth year]  DOA: 11/07/2020 PCP: Sigmund Hazel, MD    Brief Narrative:    Medical records reviewed and are as summarized below:  Jacob Ruiz is an 75 y.o. male with medical history significant of HTN noncompliant with BP meds, COPD, cigarette smoker, presented with new onset of chest pains. Patient also remembered that he had a similar episode 3 to 4 weeks ago, when he was at work and was emotional and all of sudden started to have sharp like chest pain on same spot but not irradiating and subsided by its own in that episode.  Assessment/Plan:   Active Problems:   Ischemic chest pain (HCC)   Chest pain   Angina like chest pain -Appears to have 3 vessel lesions CTA to r/o PE.  Dr. Chipper Herb Discussed with on-call cardiology, who recommended a dedicated CTA  to rule out CAD. And contact cardiology any significant finding of FFR-- FFR: Possible hemodynamically significant stenosis in a branch off the PLV. Borderline hemodynamically significant stenosis in the mid LCx. Depending on symptoms, these findings could be managed medically. --await cards consult -- ? Medical management -allergy to NSAIDs -Echo: Left ventricular ejection fraction, by estimation, is 60 to 65%. The  left ventricle has normal function. The left ventricle has no regional  wall motion abnormalities. There is mild left ventricular hypertrophy.  Left ventricular diastolic parameters  are consistent with Grade I diastolic dysfunction (impaired relaxation). -Educated to quit smoking -LDL: 118  HTN uncontrolled, noncompliant with BP meds -Start amlodipine, patient allergic to ACEI -resume home hydralazine at higher dose -HR in upper 50s  COPD -No symptoms signs of acute exacerbation -As needed albuterol  Cigar smoker -Nicotine patch and educated to quit.   Family Communication/Anticipated D/C date and plan/Code Status   DVT prophylaxis: Lovenox  ordered. Code Status: Full Code.  Disposition Plan: Status is: Observation   Dispo: The patient is from: Home              Anticipated d/c is to: Home              Anticipated d/c date is: 1 day              Patient currently is not medically stable to d/c.  Cards consult   Difficult to place patient No     Medical Consultants:    Cards (phone in ER)     Subjective:  Anxious to go home- up walking the hallways, drinking coffee Denies chest pain  Objective:    Vitals:   11/08/20 1930 11/08/20 2337 11/09/20 0747 11/09/20 1124  BP: (!) 157/99 (!) 169/71  140/73  Pulse: 67 68 84 69  Resp: 17 16  17   Temp: 98 F (36.7 C) 97.7 F (36.5 C) 98.2 F (36.8 C) 97.7 F (36.5 C)  TempSrc: Oral Oral Oral Oral  SpO2: 98% 98%    Weight:      Height:        Intake/Output Summary (Last 24 hours) at 11/09/2020 1651 Last data filed at 11/09/2020 1200 Gross per 24 hour  Intake 720 ml  Output --  Net 720 ml   Filed Weights   11/07/20 1700 11/07/20 1938  Weight: 87.5 kg 86.7 kg    Exam: Walking the hallway NAD   Data Reviewed:   I have personally reviewed following labs and imaging studies:  Labs: Labs show the following:   Basic Metabolic Panel:  Recent Labs  Lab 11/07/20 1230  NA 137  K 4.0  CL 102  CO2 23  GLUCOSE 108*  BUN 9  CREATININE 0.58*  CALCIUM 9.0   GFR Estimated Creatinine Clearance: 88.9 mL/min (A) (by C-G formula based on SCr of 0.58 mg/dL (L)). Liver Function Tests: Recent Labs  Lab 11/07/20 1230  AST 15  ALT 13  ALKPHOS 72  BILITOT 0.8  PROT 7.0  ALBUMIN 4.1   No results for input(s): LIPASE, AMYLASE in the last 168 hours. No results for input(s): AMMONIA in the last 168 hours. Coagulation profile No results for input(s): INR, PROTIME in the last 168 hours.  CBC: Recent Labs  Lab 11/07/20 1230  WBC 9.4  HGB 14.2  HCT 43.8  MCV 92.6  PLT 218   Cardiac Enzymes: No results for input(s): CKTOTAL, CKMB, CKMBINDEX,  TROPONINI in the last 168 hours. BNP (last 3 results) No results for input(s): PROBNP in the last 8760 hours. CBG: No results for input(s): GLUCAP in the last 168 hours. D-Dimer: Recent Labs    11/07/20 1230  DDIMER 0.64*   Hgb A1c: No results for input(s): HGBA1C in the last 72 hours. Lipid Profile: Recent Labs    11/07/20 1550  CHOL 197  HDL 64  LDLCALC 118*  TRIG 76  CHOLHDL 3.1   Thyroid function studies: No results for input(s): TSH, T4TOTAL, T3FREE, THYROIDAB in the last 72 hours.  Invalid input(s): FREET3 Anemia work up: No results for input(s): VITAMINB12, FOLATE, FERRITIN, TIBC, IRON, RETICCTPCT in the last 72 hours. Sepsis Labs: Recent Labs  Lab 11/07/20 1230  WBC 9.4    Microbiology Recent Results (from the past 240 hour(s))  Resp Panel by RT-PCR (Flu A&B, Covid) Nasopharyngeal Swab     Status: None   Collection Time: 11/07/20  1:00 PM   Specimen: Nasopharyngeal Swab; Nasopharyngeal(NP) swabs in vial transport medium  Result Value Ref Range Status   SARS Coronavirus 2 by RT PCR NEGATIVE NEGATIVE Final    Comment: (NOTE) SARS-CoV-2 target nucleic acids are NOT DETECTED.  The SARS-CoV-2 RNA is generally detectable in upper respiratory specimens during the acute phase of infection. The lowest concentration of SARS-CoV-2 viral copies this assay can detect is 138 copies/mL. A negative result does not preclude SARS-Cov-2 infection and should not be used as the sole basis for treatment or other patient management decisions. A negative result may occur with  improper specimen collection/handling, submission of specimen other than nasopharyngeal swab, presence of viral mutation(s) within the areas targeted by this assay, and inadequate number of viral copies(<138 copies/mL). A negative result must be combined with clinical observations, patient history, and epidemiological information. The expected result is Negative.  Fact Sheet for Patients:   BloggerCourse.com  Fact Sheet for Healthcare Providers:  SeriousBroker.it  This test is no t yet approved or cleared by the [place] FDA and  has been authorized for detection and/or diagnosis of SARS-CoV-2 by FDA under an Emergency Use Authorization (EUA). This EUA will remain  in effect (meaning this test can be used) for the duration of the COVID-19 declaration under Section 564(b)(1) of the Act, 21 U.S.C.section 360bbb-3(b)(1), unless the authorization is terminated  or revoked sooner.       Influenza A by PCR NEGATIVE NEGATIVE Final   Influenza B by PCR NEGATIVE NEGATIVE Final    Comment: (NOTE) The Xpert Xpress SARS-CoV-2/FLU/RSV plus assay is intended as an aid in the diagnosis of influenza from Nasopharyngeal swab specimens and should not  be used as a sole basis for treatment. Nasal washings and aspirates are unacceptable for Xpert Xpress SARS-CoV-2/FLU/RSV testing.  Fact Sheet for Patients: BloggerCourse.com  Fact Sheet for Healthcare Providers: SeriousBroker.it  This test is not yet approved or cleared by the [place] FDA and has been authorized for detection and/or diagnosis of SARS-CoV-2 by FDA under an Emergency Use Authorization (EUA). This EUA will remain in effect (meaning this test can be used) for the duration of the COVID-19 declaration under Section 564(b)(1) of the Act, 21 U.S.C. section 360bbb-3(b)(1), unless the authorization is terminated or revoked.  Performed at [facility], [address] [place], [place] [address]   MRSA PCR Screening     Status: None   Collection Time: 11/07/20  8:08 PM   Specimen: Nasal Mucosa; Nasopharyngeal  Result Value Ref Range Status   MRSA by PCR NEGATIVE NEGATIVE Final    Comment:        The GeneXpert MRSA Assay (FDA approved for NASAL specimens only), is one component of a comprehensive MRSA  colonization surveillance program. It is not intended to diagnose MRSA infection nor to guide or monitor treatment for MRSA infections. Performed at [facility], [address] [place], [place] [address]     Procedures and diagnostic studies:  CT CORONARY MORPH W/CTA COR W/SCORE W/CA W/CM &/OR WO/CM  Addendum Date: 11/08/2020   ADDENDUM REPORT: 11/08/2020 15:06 CLINICAL DATA:  Chest pain EXAM: Cardiac CTA MEDICATIONS: Sub lingual nitro. 4mg  x 2 TECHNIQUE: The patient was scanned on a Siemens 192 slice scanner. Gantry rotation speed was 250 msecs. Collimation was 0.6 mm. A 100 kV prospective scan was triggered in the ascending thoracic aorta at 35-75% of the R-R interval. Average HR during the scan was 60 bpm. The 3D data set was interpreted on a dedicated work station using MPR, MIP and VRT modes. A total of 80cc of contrast was used. FINDINGS: Non-cardiac: See separate report from [facility]. Pulmonary veins drain normally to the left atrium. No LA appendage thrombus. Calcium Score: 4,057 Agatston units. Coronary Arteries: Right dominant with no anomalies LM: Calcified plaque distal vessel, mild (<50%) stenosis. LAD system: Large high D1 with extensive calcified plaque proximally, probably mild (<50%) stenosis. Extensive calcified plaque in the proximal to mid LAD. Difficult to grade stenosis due to blooming artifact, but probably mild (<50%) stenosis. Circumflex system: Extensive calcified plaque in the proximal and mid vessel. Difficult to comment on degree of stenosis due to blooming artifact. RCA system: Extensive calcified plaque throughout the RCA. Suspect no more than mild (<50%) stenosis. Extensive calcified plaque throughout the PLV branch, suspect mild (<50%) stenosis though small distal branches were still diseased but not well-visualized. The ostial PDA is calcified, cannot comment on degree of stenosis due to blooming artifact. The remainder of the proximal-mid PDA has  mild (<50%) stenosis. The distal branches were still diseased but not well-visualized. IMPRESSION: 1. Coronary artery calcium score 4057 Agatston units. This places the patient in the 97th percentile for age and gender, suggesting high risk for future cardiac events. 2. Very difficult study to interpret due to very high calcium score. 3. Suspect there is not hemodynamically significant stenosis in the RCA, but difficult to comment on the more distal PDA and PLV due to blooming artifact. 4.  There does not appear to be severe disease in the LAD system. 5. Difficult to comment on proximal to mid LCX due to extensive blooming artifact. Will send for FFR. Dalton [facility]  Electronically Signed   By: Marca Anconaalton  Mclean M.D.   On: 11/08/2020 15:06   Result Date: 11/08/2020 EXAM: OVER-READ INTERPRETATION  CT CHEST The following report is an over-read performed by radiologist Dr. Trudie Reedaniel [facility]kin [facility] on 11/08/2020. This over-read does not include interpretation of cardiac or coronary anatomy or pathology. The coronary calcium score/coronary CTA interpretation by the cardiologist is attached. COMPARISON:  Chest CTA 11/07/2020. FINDINGS: Aortic atherosclerosis. Within the visualized portions of the thorax there are no suspicious appearing pulmonary nodules or masses, there is no acute consolidative airspace disease, no pleural effusions, no pneumothorax and no lymphadenopathy. Visualized portions of the upper abdomen are unremarkable. There are no aggressive appearing lytic or blastic lesions noted in the visualized portions of the skeleton. IMPRESSION: 1.  Aortic Atherosclerosis (ICD10-I70.0). Electronically Signed: By: Trudie Reedaniel  Entrikin M.D. On: 11/08/2020 14:13   CT CORONARY FRACTIONAL FLOW RESERVE FLUID ANALYSIS  Result Date: 11/09/2020 CLINICAL DATA:  Chest pain EXAM: CT FFR MEDICATIONS: No additional medications TECHNIQUE: The coronary CTA was sent for FFR FINDINGS: FFR 0.93 distal RCA.  FFR 0.77  branch of PLV.  FFR 0.92 PDA FFR 0.86 distal LAD FFR 0.83 distal D1 FFR 0.78 distal LCx IMPRESSION: Possible hemodynamically significant stenosis in a branch off the PLV. Borderline hemodynamically significant stenosis in the mid LCx. Depending on symptoms, these findings could be managed medically. Dalton Mclean Electronically Signed   By: Marca Anconaalton  Mclean M.D.   On: 11/09/2020 13:38   ECHOCARDIOGRAM COMPLETE  Result Date: 11/08/2020    ECHOCARDIOGRAM REPORT   Patient Name:   Jacob Ruiz Date of Exam: 11/08/2020 Medical Rec #:  [id number]       Height:       72.0 in Accession #:    [id number]      Weight:       191.1 lb Date of Birth:  05/07/1946       BSA:          2.090 m Patient Age:    74 years        BP:           155/82 mmHg Patient Gender: M               HR:           61 bpm. Exam Location:  Inpatient Procedure: 2D Echo, Cardiac Doppler and Color Doppler Indications:    R07.9* Chest pain, unspecified  History:        Patient has no prior history of Echocardiogram examinations.                 Signs/Symptoms:Chest Pain.  Sonographer:    Eulah PontSarah Pirrotta RDCS Referring Phys: [id number] Emeline GeneralPING T ZHANG IMPRESSIONS  1. Left ventricular ejection fraction, by estimation, is 60 to 65%. The left ventricle has normal function. The left ventricle has no regional wall motion abnormalities. There is mild left ventricular hypertrophy. Left ventricular diastolic parameters are consistent with Grade I diastolic dysfunction (impaired relaxation).  2. Right ventricular systolic function is normal. The right ventricular size is normal. Tricuspid regurgitation signal is inadequate for assessing PA pressure.  3. The mitral valve is normal in structure. Trivial mitral valve regurgitation. No evidence of mitral stenosis.  4. The aortic valve was not well visualized. Aortic valve regurgitation is not visualized. Mild aortic valve sclerosis is present, with no evidence of aortic valve stenosis.  5. The inferior vena cava is normal in  size with greater than 50% respiratory variability,  suggesting right atrial pressure of 3 mmHg.  6. Technically difficult study with poor acoustic windows. FINDINGS  Left Ventricle: Left ventricular ejection fraction, by estimation, is 60 to 65%. The left ventricle has normal function. The left ventricle has no regional wall motion abnormalities. The left ventricular internal cavity size was normal in size. There is  mild left ventricular hypertrophy. Left ventricular diastolic parameters are consistent with Grade I diastolic dysfunction (impaired relaxation). Right Ventricle: The right ventricular size is normal. No increase in right ventricular wall thickness. Right ventricular systolic function is normal. Tricuspid regurgitation signal is inadequate for assessing PA pressure. Left Atrium: Left atrial size was normal in size. Right Atrium: Right atrial size was normal in size. Pericardium: There is no evidence of pericardial effusion. Mitral Valve: The mitral valve is normal in structure. Trivial mitral valve regurgitation. No evidence of mitral valve stenosis. Tricuspid Valve: The tricuspid valve is normal in structure. Tricuspid valve regurgitation is trivial. Aortic Valve: The aortic valve was not well visualized. Aortic valve regurgitation is not visualized. Mild aortic valve sclerosis is present, with no evidence of aortic valve stenosis. Pulmonic Valve: The pulmonic valve was not well visualized. Pulmonic valve regurgitation is not visualized. Aorta: The aortic root is normal in size and structure. Venous: The inferior vena cava is normal in size with greater than 50% respiratory variability, suggesting right atrial pressure of 3 mmHg. IAS/Shunts: No atrial level shunt detected by color flow Doppler.  LEFT VENTRICLE PLAX 2D LVIDd:         4.80 cm  Diastology LVIDs:         3.40 cm  LV e' medial:    7.18 cm/s LV PW:         0.80 cm  LV E/e' medial:  6.6 LV IVS:        0.80 cm  LV e' lateral:   8.27 cm/s LVOT  diam:     2.00 cm  LV E/e' lateral: 5.7 LV SV:         72 LV SV Index:   34 LVOT Area:     3.14 cm  RIGHT VENTRICLE RV S prime:     17.40 cm/s TAPSE (M-mode): 2.8 cm LEFT ATRIUM             Index       RIGHT ATRIUM           Index LA diam:        3.10 cm 1.48 cm/m  RA Area:     15.00 cm LA Vol (A2C):   46.0 ml 22.01 ml/m RA Volume:   35.90 ml  17.18 ml/m LA Vol (A4C):   40.8 ml 19.52 ml/m LA Biplane Vol: 44.0 ml 21.05 ml/m  AORTIC VALVE LVOT Vmax:   102.00 cm/s LVOT Vmean:  68.700 cm/s LVOT VTI:    0.229 m  AORTA Ao Root diam: 3.20 cm MITRAL VALVE MV Area (PHT): 1.41 cm    SHUNTS MV Decel Time: 539 msec    Systemic VTI:  0.23 m MV E velocity: 47.30 cm/s  Systemic Diam: 2.00 cm MV A velocity: 60.00 cm/s MV E/A ratio:  0.79 Marca Ancona MD Electronically signed by Marca Ancona MD Signature Date/Time: 11/08/2020/2:05:43 PM    Final     Medications:   . amLODipine  5 mg Oral Daily  . atorvastatin  20 mg Oral Daily  . enoxaparin (LOVENOX) injection  40 mg Subcutaneous Q24H  . hydrALAZINE  75 mg Oral BID  .  nicotine  21 mg Transdermal Daily   Continuous Infusions:   LOS: 0 days   Joseph Art  [facility]   How to contact the [facility] Attending or Consulting provider 7A - 7P or covering provider during after hours 7P -7A, for this patient?  1. Check the care team in [facility] and look for a) attending/consulting [facility] provider listed and b) the [facility] team listed 2. Log into [email or web address] and use [place]'s universal password to access. If you do not have the password, please contact the hospital operator. 3. Locate the [facility] provider you are looking for under [facility] and page to a number that you can be directly reached. 4. If you still have difficulty reaching the provider, please page the [facility] (Director on Call) for the Hospitalists listed on amion for assistance.  11/09/2020, 4:51 PM

## 2020-11-09 NOTE — Discharge Summary (Signed)
Physician Discharge Summary  Jacob Ruiz:[id number] DOB: September 30, 1945 DOA: 11/07/2020  PCP: Sigmund Hazel, MD  Admit date: 11/07/2020 Discharge date: 11/09/2020  Admitted From: home Discharge disposition: home   Recommendations for Outpatient Follow-Up:   1. Verify patient taking 81 mg ASA 2. Smoking cessation 3. Compliance with BP meds   Discharge Diagnosis:   Active Problems:   Ischemic chest pain (HCC)   Chest pain    Discharge Condition: Improved.  Diet recommendation: Low sodium, heart healthy.   Wound care: None.  Code status: Full.   History of Present Illness:   Jacob Ruiz is a 75 y.o. male with medical history significant of HTN noncompliant with BP meds, COPD, cigarette smoker, presented with new onset of chest pains.  Patient was sitting in the church, and suddenly started to feel sharp-like chest pain lower left chest, radiating to left upper arm, with sharp-like 8/10 than became more severe and squeezing-like, associated with strong feeling of shortness of breath and sweating, no nausea vomiting. Had to remain seated, no obvious alleviating or exacerbating factors. Episode lasted 20 to 30 minutes and responded to sublingual nitroglycerin. Patient also remembered that he had a similar episode 3 to 4 weeks ago, when he was at work and was emotional and all of sudden started to have sharp like chest pain on same spot but not irradiating and subsided by its own in that episode.  ED Course: Troponin negative x2, EKG showed chronic ST changes on V1 V2. CT angiogram negative for PE but severe calcification on 3 coronary arteries.    Hospital Course by Problem:   Chest pain - mod CAD on cardiac CT w/ borderline FFR - Pt prefers med rx - start ASA, BB, Imdur - encourage smoking cessation - outpatient cardiology consult  HTN -not compliant with meds at home - advised that goal BP 130/80 - admits that he gets stressed and suspects BP  probably runs up when he does -per cards: start BB and Imdur, continue amlodipine - keep BP log daily, vary the times    Medical Consultants:    cards  Discharge Exam:   Vitals:   11/09/20 0747 11/09/20 1124  BP:  140/73  Pulse: 84 69  Resp:  17  Temp: 98.2 F (36.8 C) 97.7 F (36.5 C)  SpO2:     Vitals:   11/08/20 1930 11/08/20 2337 11/09/20 0747 11/09/20 1124  BP: (!) 157/99 (!) 169/71  140/73  Pulse: 67 68 84 69  Resp: 17 16  17   Temp: 98 F (36.7 C) 97.7 F (36.5 C) 98.2 F (36.8 C) 97.7 F (36.5 C)  TempSrc: Oral Oral Oral Oral  SpO2: 98% 98%    Weight:      Height:       .    The results of significant diagnostics from this hospitalization (including imaging, microbiology, ancillary and laboratory) are listed below for reference.     Procedures and Diagnostic Studies:   CT Angio Chest PE W and/or Wo Contrast  Result Date: 11/07/2020 CLINICAL DATA:  75 y/o male from home after experiencing left sided chest pain with radiation to arm while sitting in church. Pt states the pain came on while coughing or inhaling. EXAM: CT ANGIOGRAPHY CHEST WITH CONTRAST TECHNIQUE: Multidetector CT imaging of the chest was performed using the standard protocol during bolus administration of intravenous contrast. Multiplanar CT image reconstructions and MIPs were obtained to evaluate the vascular anatomy. CONTRAST:  70mL OMNIPAQUE  IOHEXOL 350 MG/ML SOLN COMPARISON:  Current chest radiograph.  Chest CT, 01/28/2004 FINDINGS: Cardiovascular: Pulmonary arteries are well opacified. There is no evidence of a pulmonary embolism. Heart is normal in size and configuration. Three-vessel coronary artery calcifications. No pericardial effusion. Aorta is normal in caliber. Aortic atherosclerosis. No significant stenosis. No dissection. Mediastinum/Nodes: No enlarged mediastinal, hilar, or axillary lymph nodes. Thyroid gland, trachea, and esophagus demonstrate no significant findings. Lungs/Pleura:  Minor linear atelectasis at the base of the right lower lobe. Lungs otherwise clear. No pleural effusion or pneumothorax. Mild centrilobular paraseptal emphysema in the upper lobes. Upper Abdomen: No acute abnormality. Musculoskeletal: Minor compression deformity of T7, which appears chronic. No evidence of acute fracture. No bone lesion. Review of the MIP images confirms the above findings. IMPRESSION: 1. No evidence of a pulmonary embolism. 2. No acute findings. 3. Minor right lower lobe dependent atelectasis. 4. Emphysema and aortic atherosclerosis well as three-vessel coronary artery calcifications. Aortic Atherosclerosis (ICD10-I70.0) and Emphysema (ICD10-J43.9). Electronically Signed   By: Amie Portland M.D.   On: 11/07/2020 15:34   CT CORONARY MORPH W/CTA COR W/SCORE W/CA W/CM &/OR WO/CM  Addendum Date: 11/08/2020   ADDENDUM REPORT: 11/08/2020 15:06 CLINICAL DATA:  Chest pain EXAM: Cardiac CTA MEDICATIONS: Sub lingual nitro. 4mg  x 2 TECHNIQUE: The patient was scanned on a Siemens 192 slice scanner. Gantry rotation speed was 250 msecs. Collimation was 0.6 mm. A 100 kV prospective scan was triggered in the ascending thoracic aorta at 35-75% of the R-R interval. Average HR during the scan was 60 bpm. The 3D data set was interpreted on a dedicated work station using MPR, MIP and VRT modes. A total of 80cc of contrast was used. FINDINGS: Non-cardiac: See separate report from [facility]. Pulmonary veins drain normally to the left atrium. No LA appendage thrombus. Calcium Score: 4,057 Agatston units. Coronary Arteries: Right dominant with no anomalies LM: Calcified plaque distal vessel, mild (<50%) stenosis. LAD system: Large high D1 with extensive calcified plaque proximally, probably mild (<50%) stenosis. Extensive calcified plaque in the proximal to mid LAD. Difficult to grade stenosis due to blooming artifact, but probably mild (<50%) stenosis. Circumflex system: Extensive calcified plaque in the  proximal and mid vessel. Difficult to comment on degree of stenosis due to blooming artifact. RCA system: Extensive calcified plaque throughout the RCA. Suspect no more than mild (<50%) stenosis. Extensive calcified plaque throughout the PLV branch, suspect mild (<50%) stenosis though small distal branches were still diseased but not well-visualized. The ostial PDA is calcified, cannot comment on degree of stenosis due to blooming artifact. The remainder of the proximal-mid PDA has mild (<50%) stenosis. The distal branches were still diseased but not well-visualized. IMPRESSION: 1. Coronary artery calcium score 4057 Agatston units. This places the patient in the 97th percentile for age and gender, suggesting high risk for future cardiac events. 2. Very difficult study to interpret due to very high calcium score. 3. Suspect there is not hemodynamically significant stenosis in the RCA, but difficult to comment on the more distal PDA and PLV due to blooming artifact. 4.  There does not appear to be severe disease in the LAD system. 5. Difficult to comment on proximal to mid LCX due to extensive blooming artifact. Will send for FFR. Dalton Mclean Electronically Signed   By: Marca Ancona M.D.   On: 11/08/2020 15:06   Result Date: 11/08/2020 EXAM: OVER-READ INTERPRETATION  CT CHEST The following report is an over-read performed by radiologist Dr. Trudie Reed of [facility] on 11/08/2020. This over-read does not include interpretation of cardiac or coronary anatomy or pathology. The coronary calcium score/coronary CTA interpretation by the cardiologist is attached. COMPARISON:  Chest CTA 11/07/2020. FINDINGS: Aortic atherosclerosis. Within the visualized portions of the thorax there are no suspicious appearing pulmonary nodules or masses, there is no acute consolidative airspace disease, no pleural effusions, no pneumothorax and no lymphadenopathy. Visualized portions of the upper abdomen are  unremarkable. There are no aggressive appearing lytic or blastic lesions noted in the visualized portions of the skeleton. IMPRESSION: 1.  Aortic Atherosclerosis (ICD10-I70.0). Electronically Signed: By: Trudie Reed M.D. On: 11/08/2020 14:13   DG Chest Port 1 View  Result Date: 11/07/2020 CLINICAL DATA:  Acute chest pain at church EXAM: PORTABLE CHEST 1 VIEW COMPARISON:  09/08/2018 FINDINGS: Hyperinflation compatible with background COPD/emphysema. Normal heart size and vascularity. Minor right base atelectasis or scarring. No acute pneumonia, collapse or consolidation. Negative for edema, effusion or pneumothorax. Trachea midline. Aorta atherosclerotic. Degenerative changes of the spine. IMPRESSION: COPD/emphysema. Minor right base atelectasis or scarring No acute process by plain radiography Aortic Atherosclerosis (ICD10-I70.0). Electronically Signed   By: Judie Petit.  Shick M.D.   On: 11/07/2020 13:15   CT CORONARY FRACTIONAL FLOW RESERVE FLUID ANALYSIS  Result Date: 11/09/2020 CLINICAL DATA:  Chest pain EXAM: CT FFR MEDICATIONS: No additional medications TECHNIQUE: The coronary CTA was sent for FFR FINDINGS: FFR 0.93 distal RCA.  FFR 0.77 branch of PLV.  FFR 0.92 PDA FFR 0.86 distal LAD FFR 0.83 distal D1 FFR 0.78 distal LCx IMPRESSION: Possible hemodynamically significant stenosis in a branch off the PLV. Borderline hemodynamically significant stenosis in the mid LCx. Depending on symptoms, these findings could be managed medically. Dalton Mclean Electronically Signed   By: Marca Ancona M.D.   On: 11/09/2020 13:38   ECHOCARDIOGRAM COMPLETE  Result Date: 11/08/2020    ECHOCARDIOGRAM REPORT   Patient Name:   MAYCOL HOYING Date of Exam: 11/08/2020 Medical Rec #:  [id number]       Height:       72.0 in Accession #:    [id number]      Weight:       191.1 lb Date of Birth:  [birth year]       BSA:          2.090 m Patient Age:    74 years        BP:           155/82 mmHg Patient Gender: M               HR:            61 bpm. Exam Location:  Inpatient Procedure: 2D Echo, Cardiac Doppler and Color Doppler Indications:    R07.9* Chest pain, unspecified  History:        Patient has no prior history of Echocardiogram examinations.                 Signs/Symptoms:Chest Pain.  Sonographer:    Eulah Pont RDCS Referring Phys: [id number] Emeline General IMPRESSIONS  1. Left ventricular ejection fraction, by estimation, is 60 to 65%. The left ventricle has normal function. The left ventricle has no regional wall motion abnormalities. There is mild left ventricular hypertrophy. Left ventricular diastolic parameters are consistent with Grade I diastolic dysfunction (impaired relaxation).  2. Right ventricular systolic function is normal. The right ventricular size is normal. Tricuspid regurgitation signal is inadequate for assessing PA pressure.  3. The mitral valve is  normal in structure. Trivial mitral valve regurgitation. No evidence of mitral stenosis.  4. The aortic valve was not well visualized. Aortic valve regurgitation is not visualized. Mild aortic valve sclerosis is present, with no evidence of aortic valve stenosis.  5. The inferior vena cava is normal in size with greater than 50% respiratory variability, suggesting right atrial pressure of 3 mmHg.  6. Technically difficult study with poor acoustic windows. FINDINGS  Left Ventricle: Left ventricular ejection fraction, by estimation, is 60 to 65%. The left ventricle has normal function. The left ventricle has no regional wall motion abnormalities. The left ventricular internal cavity size was normal in size. There is  mild left ventricular hypertrophy. Left ventricular diastolic parameters are consistent with Grade I diastolic dysfunction (impaired relaxation). Right Ventricle: The right ventricular size is normal. No increase in right ventricular wall thickness. Right ventricular systolic function is normal. Tricuspid regurgitation signal is inadequate for assessing PA  pressure. Left Atrium: Left atrial size was normal in size. Right Atrium: Right atrial size was normal in size. Pericardium: There is no evidence of pericardial effusion. Mitral Valve: The mitral valve is normal in structure. Trivial mitral valve regurgitation. No evidence of mitral valve stenosis. Tricuspid Valve: The tricuspid valve is normal in structure. Tricuspid valve regurgitation is trivial. Aortic Valve: The aortic valve was not well visualized. Aortic valve regurgitation is not visualized. Mild aortic valve sclerosis is present, with no evidence of aortic valve stenosis. Pulmonic Valve: The pulmonic valve was not well visualized. Pulmonic valve regurgitation is not visualized. Aorta: The aortic root is normal in size and structure. Venous: The inferior vena cava is normal in size with greater than 50% respiratory variability, suggesting right atrial pressure of 3 mmHg. IAS/Shunts: No atrial level shunt detected by color flow Doppler.  LEFT VENTRICLE PLAX 2D LVIDd:         4.80 cm  Diastology LVIDs:         3.40 cm  LV e' medial:    7.18 cm/s LV PW:         0.80 cm  LV E/e' medial:  6.6 LV IVS:        0.80 cm  LV e' lateral:   8.27 cm/s LVOT diam:     2.00 cm  LV E/e' lateral: 5.7 LV SV:         72 LV SV Index:   34 LVOT Area:     3.14 cm  RIGHT VENTRICLE RV S prime:     17.40 cm/s TAPSE (M-mode): 2.8 cm LEFT ATRIUM             Index       RIGHT ATRIUM           Index LA diam:        3.10 cm 1.48 cm/m  RA Area:     15.00 cm LA Vol (A2C):   46.0 ml 22.01 ml/m RA Volume:   35.90 ml  17.18 ml/m LA Vol (A4C):   40.8 ml 19.52 ml/m LA Biplane Vol: 44.0 ml 21.05 ml/m  AORTIC VALVE LVOT Vmax:   102.00 cm/s LVOT Vmean:  68.700 cm/s LVOT VTI:    0.229 m  AORTA Ao Root diam: 3.20 cm MITRAL VALVE MV Area (PHT): 1.41 cm    SHUNTS MV Decel Time: 539 msec    Systemic VTI:  0.23 m MV E velocity: 47.30 cm/s  Systemic Diam: 2.00 cm MV A velocity: 60.00 cm/s MV E/A ratio:  0.79 Marca Ancona MD  Electronically signed  by Marca Ancona MD Signature Date/Time: 11/08/2020/2:05:43 PM    Final      Labs:   Basic Metabolic Panel: Recent Labs  Lab 11/07/20 1230  NA 137  K 4.0  CL 102  CO2 23  GLUCOSE 108*  BUN 9  CREATININE 0.58*  CALCIUM 9.0   GFR Estimated Creatinine Clearance: 88.9 mL/min (A) (by C-G formula based on SCr of 0.58 mg/dL (L)). Liver Function Tests: Recent Labs  Lab 11/07/20 1230  AST 15  ALT 13  ALKPHOS 72  BILITOT 0.8  PROT 7.0  ALBUMIN 4.1   No results for input(s): LIPASE, AMYLASE in the last 168 hours. No results for input(s): AMMONIA in the last 168 hours. Coagulation profile No results for input(s): INR, PROTIME in the last 168 hours.  CBC: Recent Labs  Lab 11/07/20 1230  WBC 9.4  HGB 14.2  HCT 43.8  MCV 92.6  PLT 218   Cardiac Enzymes: No results for input(s): CKTOTAL, CKMB, CKMBINDEX, TROPONINI in the last 168 hours. BNP: Invalid input(s): POCBNP CBG: No results for input(s): GLUCAP in the last 168 hours. D-Dimer Recent Labs    11/07/20 1230  DDIMER 0.64*   Hgb A1c No results for input(s): HGBA1C in the last 72 hours. Lipid Profile Recent Labs    11/07/20 1550  CHOL 197  HDL 64  LDLCALC 118*  TRIG 76  CHOLHDL 3.1   Thyroid function studies No results for input(s): TSH, T4TOTAL, T3FREE, THYROIDAB in the last 72 hours.  Invalid input(s): FREET3 Anemia work up No results for input(s): VITAMINB12, FOLATE, FERRITIN, TIBC, IRON, RETICCTPCT in the last 72 hours. Microbiology Recent Results (from the past 240 hour(s))  Resp Panel by RT-PCR (Flu A&B, Covid) Nasopharyngeal Swab     Status: None   Collection Time: 11/07/20  1:00 PM   Specimen: Nasopharyngeal Swab; Nasopharyngeal(NP) swabs in vial transport medium  Result Value Ref Range Status   SARS Coronavirus 2 by RT PCR NEGATIVE NEGATIVE Final    Comment: (NOTE) SARS-CoV-2 target nucleic acids are NOT DETECTED.  The SARS-CoV-2 RNA is generally detectable in upper  respiratory specimens during the acute phase of infection. The lowest concentration of SARS-CoV-2 viral copies this assay can detect is 138 copies/mL. A negative result does not preclude SARS-Cov-2 infection and should not be used as the sole basis for treatment or other patient management decisions. A negative result may occur with  improper specimen collection/handling, submission of specimen other than nasopharyngeal swab, presence of viral mutation(s) within the areas targeted by this assay, and inadequate number of viral copies(<138 copies/mL). A negative result must be combined with clinical observations, patient history, and epidemiological information. The expected result is Negative.  Fact Sheet for Patients:  BloggerCourse.com  Fact Sheet for Healthcare Providers:  SeriousBroker.it  This test is no t yet approved or cleared by the [place] FDA and  has been authorized for detection and/or diagnosis of SARS-CoV-2 by FDA under an Emergency Use Authorization (EUA). This EUA will remain  in effect (meaning this test can be used) for the duration of the COVID-19 declaration under Section 564(b)(1) of the Act, 21 U.S.C.section 360bbb-3(b)(1), unless the authorization is terminated  or revoked sooner.       Influenza A by PCR NEGATIVE NEGATIVE Final   Influenza B by PCR NEGATIVE NEGATIVE Final    Comment: (NOTE) The Xpert Xpress SARS-CoV-2/FLU/RSV plus assay is intended as an aid in the diagnosis of influenza from Nasopharyngeal swab specimens and should  not be used as a sole basis for treatment. Nasal washings and aspirates are unacceptable for Xpert Xpress SARS-CoV-2/FLU/RSV testing.  Fact Sheet for Patients: BloggerCourse.comhttps://www.fda.gov/media/152166/download  Fact Sheet for Healthcare Providers: SeriousBroker.ithttps://www.fda.gov/media/152162/download  This test is not yet approved or cleared by the [place] FDA and has been  authorized for detection and/or diagnosis of SARS-CoV-2 by FDA under an Emergency Use Authorization (EUA). This EUA will remain in effect (meaning this test can be used) for the duration of the COVID-19 declaration under Section 564(b)(1) of the Act, 21 U.S.C. section 360bbb-3(b)(1), unless the authorization is terminated or revoked.  Performed at [facility], [address] [place], [place] [address]   MRSA PCR Screening     Status: None   Collection Time: 11/07/20  8:08 PM   Specimen: Nasal Mucosa; Nasopharyngeal  Result Value Ref Range Status   MRSA by PCR NEGATIVE NEGATIVE Final    Comment:        The GeneXpert MRSA Assay (FDA approved for NASAL specimens only), is one component of a comprehensive MRSA colonization surveillance program. It is not intended to diagnose MRSA infection nor to guide or monitor treatment for MRSA infections. Performed at [facility], [address] [place], [place] [address]      Discharge Instructions:   Discharge Instructions    Diet - low sodium heart healthy   Complete by: As directed    Increase activity slowly   Complete by: As directed      Allergies as of 11/09/2020      Reactions   Lisinopril Anaphylaxis   Ace Inhibitors    unknown   Aleve [naproxen Sodium] Rash      Medication List    STOP taking these medications   amoxicillin-clavulanate 875-125 MG tablet Commonly known as: AUGMENTIN   antipyrine-benzocaine OTIC solution Commonly known as: AURALGAN   azithromycin 500 MG tablet Commonly known as: ZITHROMAX   benzonatate 100 MG capsule Commonly known as: TESSALON   cefUROXime 500 MG tablet Commonly known as: CEFTIN   fluticasone 50 MCG/ACT nasal spray Commonly known as: FLONASE   hydrALAZINE 50 MG tablet Commonly known as: APRESOLINE   Ipratropium-Albuterol 20-100 MCG/ACT Aers respimat Commonly known as: COMBIVENT   LINIMENTS EX   loratadine 10 MG tablet Commonly known as: CLARITIN    meclizine 25 MG tablet Commonly known as: ANTIVERT   UNABLE TO FIND     TAKE these medications   acetaminophen 500 MG tablet Commonly known as: TYLENOL Take 1,000 mg by mouth every 6 (six) hours as needed for mild pain.   albuterol 108 (90 Base) MCG/ACT inhaler Commonly known as: VENTOLIN HFA Inhale 1 puff into the lungs every 4 (four) hours as needed for wheezing or shortness of breath.   amLODipine 5 MG tablet Commonly known as: NORVASC Take 1 tablet (5 mg total) by mouth daily. Start taking on: November 10, 2020   atorvastatin 20 MG tablet Commonly known as: LIPITOR Take 1 tablet (20 mg total) by mouth daily. Start taking on: November 10, 2020   isosorbide mononitrate 30 MG 24 hr tablet Commonly known as: IMDUR Take 1 tablet (30 mg total) by mouth daily.   metoprolol succinate 25 MG 24 hr tablet Commonly known as: TOPROL-XL Take 1 tablet (25 mg total) by mouth daily.   nicotine 21 mg/24hr patch Commonly known as: NICODERM CQ - dosed in mg/24 hours Place 1 patch (21 mg total) onto the skin daily. Start taking on: November 10, 2020  Follow-up Information    Sigmund Hazel, MD Follow up in 1 week(s).   Specialty: Family Medicine Contact information: [address] [place] [address] [phone]                Time coordinating discharge: 35 min  Signed:  Joseph Art DO  [facility] 11/09/2020, 6:16 PM

## 2020-11-09 NOTE — Consult Note (Addendum)
Cardiology Consultation:   Patient ID: Jacob Ruiz; [id number]; 1946-07-02   Admit date: 11/07/2020 Date of Consult: 11/09/2020  Primary Care Provider: Sigmund Hazel, MD Primary Cardiologist: No primary care provider on file. New Primary Electrophysiologist:  None   Patient Profile:   Jacob Ruiz is a 75 y.o. male with a hx of HTN (non-compliant w/ Rx), COPD, tob use, who is being seen today for the evaluation of chest pain at the request of Dr Benjamine Mola.  History of Present Illness:   Jacob Ruiz has had a decreased activity level since he broke his kneecap in October 2021. He has been doing PT, and was using a leaf-blower this weekend, no chest pain with any of this.  He was at church, almost at the end and had sudden onset of L CP, it was a sharp pain and a hard throbbing pain. Also mentions a fist pressing on his chest. The pain made it hard to breathe. He got ASA in the church and got SL NTG x 1 by EMS.  By the time he got to the hospital, the pain was about gone. He does get some discomfort when he coughs.  He got some discomfort during the echo, but that was from the pressure of the probe.  He is currently feeling very well and would like to go home.   Past Medical History:  Diagnosis Date  . Chronic bronchitis (HCC)    "get it just about q yr" (03/26/2014)  . H/O hiatal hernia     Past Surgical History:  Procedure Laterality Date  . KNEE ARTHROSCOPY Right 1970's  . TONSILLECTOMY  1950's     Prior to Admission medications   Medication Sig Start Date End Date Taking? Authorizing Provider  acetaminophen (TYLENOL) 500 MG tablet Take 1,000 mg by mouth every 6 (six) hours as needed for mild pain.   Yes [provider]  albuterol (VENTOLIN HFA) 108 (90 Base) MCG/ACT inhaler Inhale 1 puff into the lungs every 4 (four) hours as needed for wheezing or shortness of breath. 09/30/20  Yes [provider]  LINIMENTS EX Apply 1 application topically daily as  needed (back and knee pain).   Yes [provider]  amoxicillin-clavulanate (AUGMENTIN) 875-125 MG tablet Take 1 tablet by mouth every 12 (twelve) hours. Patient not taking: No sig reported 09/02/19   Michela Pitcher A, PA-C  antipyrine-benzocaine [facility]) otic solution Place 2 drops into the right ear every 4 (four) hours as needed for ear pain. Patient not taking: No sig reported 03/28/14   Rai, Ripudeep K, MD  azithromycin (ZITHROMAX) 500 MG tablet Take 1 tablet (500 mg total) by mouth daily. X 7 DAYS Patient not taking: No sig reported 03/28/14   Rai, Ripudeep K, MD  benzonatate (TESSALON) 100 MG capsule Take 1 capsule (100 mg total) by mouth every 8 (eight) hours. Patient not taking: No sig reported 09/08/18   Dartha Lodge, PA-C  cefUROXime (CEFTIN) 500 MG tablet Take 1 tablet (500 mg total) by mouth 2 (two) times daily with a meal. X 1 WEEK Patient not taking: No sig reported 03/28/14   Rai, Ripudeep K, MD  fluticasone (FLONASE) 50 MCG/ACT nasal spray Place 1 spray into both nostrils daily. For allergies Patient not taking: No sig reported 03/28/14   Rai, Ripudeep K, MD  hydrALAZINE (APRESOLINE) 50 MG tablet Take 1 tablet (50 mg total) by mouth 2 (two) times daily. Patient not taking: No sig reported 03/28/14   Rai, Ripudeep K,  MD  Ipratropium-Albuterol (COMBIVENT) 20-100 MCG/ACT AERS respimat Inhale 1 puff into the lungs every 6 (six) hours. Patient not taking: No sig reported 09/08/18   Dartha Lodge, PA-C  loratadine (CLARITIN) 10 MG tablet Take 1 tablet (10 mg total) by mouth daily as needed for allergies. Patient not taking: No sig reported 03/28/14   Rai, Delene Ruffini, MD  meclizine (ANTIVERT) 25 MG tablet Take 1 tablet (25 mg total) by mouth 3 (three) times daily as needed for dizziness (also available over the counter). Patient not taking: No sig reported 03/28/14   Rai, Delene Ruffini, MD  UNABLE TO FIND Vestibular rehabilitation/occupational therapy     Diagnosis: Vertigo  (BPPV) Patient not taking: No sig reported 03/28/14   Rai, Delene Ruffini, MD    Inpatient Medications: Scheduled Meds: . amLODipine  5 mg Oral Daily  . atorvastatin  20 mg Oral Daily  . enoxaparin (LOVENOX) injection  40 mg Subcutaneous Q24H  . hydrALAZINE  75 mg Oral BID  . nicotine  21 mg Transdermal Daily   Continuous Infusions:  PRN Meds: acetaminophen, albuterol, labetalol, ondansetron (ZOFRAN) IV  Allergies:    Allergies  Allergen Reactions  . Lisinopril Anaphylaxis  . Ace Inhibitors     unknown  . Aleve [Naproxen Sodium] Rash    Social History:   Social History   Socioeconomic History  . Marital status: Married    Spouse name: Not on file  . Number of children: Not on file  . Years of education: Not on file  . Highest education level: Not on file  Occupational History  . Not on file  Tobacco Use  . Smoking status: Current Every Day Smoker    Packs/day: 2.00    Years: 54.00    Pack years: 108.00    Types: Cigarettes  . Smokeless tobacco: Never Used  Substance and Sexual Activity  . Alcohol use: No  . Drug use: No  . Sexual activity: Not Currently  Other Topics Concern  . Not on file  Social History Narrative  . Not on file   Social Determinants of Health   Financial Resource Strain: Not on file  Food Insecurity: Not on file  Transportation Needs: Not on file  Physical Activity: Not on file  Stress: Not on file  Social Connections: Not on file  Intimate Partner Violence: Not on file    Family History:   Family History  Problem Relation Age of Onset  . Heart failure Mother   . Heart disease Father    Family Status:  Family Status  Relation Name Status  . Mother  Deceased  . Father  Deceased    ROS:  Please see the history of present illness.  All other ROS reviewed and negative.     Physical Exam/Data:   Vitals:   11/08/20 1930 11/08/20 2337 11/09/20 0747 11/09/20 1124  BP: (!) 157/99 (!) 169/71  140/73  Pulse: 67 68 84 69  Resp: 17  16  17   Temp: 98 F (36.7 C) 97.7 F (36.5 C) 98.2 F (36.8 C) 97.7 F (36.5 C)  TempSrc: Oral Oral Oral Oral  SpO2: 98% 98%    Weight:      Height:        Intake/Output Summary (Last 24 hours) at 11/09/2020 1729 Last data filed at 11/09/2020 1700 Gross per 24 hour  Intake 2400 ml  Output -  Net 2400 ml    Last 3 Weights 11/07/2020 11/07/2020 09/08/2018  Weight (lbs) 191 lb  2.2 oz 193 lb 168 lb  Weight (kg) 86.7 kg 87.544 kg 76.204 kg     Body mass index is 25.92 kg/m.   General:  Well nourished, well developed, male in no acute distress HEENT: normal Lymph: no adenopathy Neck: JVD - not elevated Endocrine:  No thryomegaly Vascular: No carotid bruits; 4/4 extremity pulses 2+  Cardiac:  normal S1, S2; RRR; 2/6 murmur Lungs:  clear bilaterally, no wheezing, rhonchi or rales  Abd: soft, nontender, no hepatomegaly  Ext: no edema Musculoskeletal:  No deformities, BUE and BLE strength normal and equal Skin: warm and dry  Neuro:  CNs 2-12 intact, no focal abnormalities noted Psych:  Normal affect   EKG:  The EKG was personally reviewed and demonstrates:  SR, HR 64, Inc RBBB & LAFB Telemetry:  Telemetry was personally reviewed and demonstrates:  SR   CV studies:   ECHO: 11/08/2020 1. Left ventricular ejection fraction, by estimation, is 60 to 65%. The  left ventricle has normal function. The left ventricle has no regional  wall motion abnormalities. There is mild left ventricular hypertrophy.  Left ventricular diastolic parameters  are consistent with Grade I diastolic dysfunction (impaired relaxation).  2. Right ventricular systolic function is normal. The right ventricular  size is normal. Tricuspid regurgitation signal is inadequate for assessing  PA pressure.  3. The mitral valve is normal in structure. Trivial mitral valve  regurgitation. No evidence of mitral stenosis.  4. The aortic valve was not well visualized. Aortic valve regurgitation  is not  visualized. Mild aortic valve sclerosis is present, with no  evidence of aortic valve stenosis.  5. The inferior vena cava is normal in size with greater than 50%  respiratory variability, suggesting right atrial pressure of 3 mmHg.  6. Technically difficult study with poor acoustic windows.   CARDIAC CTA: 11/09/2020  FINDINGS: FFR 0.93 distal RCA.  FFR 0.77 branch of PLV.  FFR 0.92 PDA  FFR 0.86 distal LAD  FFR 0.83 distal D1  FFR 0.78 distal LCx  IMPRESSION: Possible hemodynamically significant stenosis in a branch off the PLV. Borderline hemodynamically significant stenosis in the mid LCx. Depending on symptoms, these findings could be managed medically.  CT PE Protocol IMPRESSION: 1. No evidence of a pulmonary embolism. 2. No acute findings. 3. Minor right lower lobe dependent atelectasis. 4. Emphysema and aortic atherosclerosis well as three-vessel coronary artery calcifications.    Laboratory Data:   Chemistry Recent Labs  Lab 11/07/20 1230  NA 137  K 4.0  CL 102  CO2 23  GLUCOSE 108*  BUN 9  CREATININE 0.58*  CALCIUM 9.0  GFRNONAA >60  ANIONGAP 12    Lab Results  Component Value Date   ALT 13 11/07/2020   AST 15 11/07/2020   ALKPHOS 72 11/07/2020   BILITOT 0.8 11/07/2020   Hematology Recent Labs  Lab 11/07/20 1230  WBC 9.4  RBC 4.73  HGB 14.2  HCT 43.8  MCV 92.6  MCH 30.0  MCHC 32.4  RDW 12.9  PLT 218   Cardiac Enzymes High Sensitivity Troponin:   Recent Labs  Lab 11/07/20 1230 11/07/20 1550  TROPONINIHS 6 7      BNPNo results for input(s): BNP, PROBNP in the last 168 hours.  DDimer  Recent Labs  Lab 11/07/20 1230  DDIMER 0.64*   TSH:  Lab Results  Component Value Date   TSH 0.819 03/26/2014   Lipids: Lab Results  Component Value Date   CHOL 197 11/07/2020   HDL  64 11/07/2020   LDLCALC 118 (H) 11/07/2020   LDLDIRECT 150.1 07/30/2006   TRIG 76 11/07/2020   CHOLHDL 3.1 11/07/2020   HgbA1c:No results found  for: HGBA1C Magnesium: No results found for: MG   Radiology/Studies:  CT Angio Chest PE W and/or Wo Contrast  Result Date: 11/07/2020 CLINICAL DATA:  75 y/o male from home after experiencing left sided chest pain with radiation to arm while sitting in church. Pt states the pain came on while coughing or inhaling. EXAM: CT ANGIOGRAPHY CHEST WITH CONTRAST TECHNIQUE: Multidetector CT imaging of the chest was performed using the standard protocol during bolus administration of intravenous contrast. Multiplanar CT image reconstructions and MIPs were obtained to evaluate the vascular anatomy. CONTRAST:  70mL OMNIPAQUE IOHEXOL 350 MG/ML SOLN COMPARISON:  Current chest radiograph.  Chest CT, 01/28/2004 FINDINGS: Cardiovascular: Pulmonary arteries are well opacified. There is no evidence of a pulmonary embolism. Heart is normal in size and configuration. Three-vessel coronary artery calcifications. No pericardial effusion. Aorta is normal in caliber. Aortic atherosclerosis. No significant stenosis. No dissection. Mediastinum/Nodes: No enlarged mediastinal, hilar, or axillary lymph nodes. Thyroid gland, trachea, and esophagus demonstrate no significant findings. Lungs/Pleura: Minor linear atelectasis at the base of the right lower lobe. Lungs otherwise clear. No pleural effusion or pneumothorax. Mild centrilobular paraseptal emphysema in the upper lobes. Upper Abdomen: No acute abnormality. Musculoskeletal: Minor compression deformity of T7, which appears chronic. No evidence of acute fracture. No bone lesion. Review of the MIP images confirms the above findings. IMPRESSION: 1. No evidence of a pulmonary embolism. 2. No acute findings. 3. Minor right lower lobe dependent atelectasis. 4. Emphysema and aortic atherosclerosis well as three-vessel coronary artery calcifications. Aortic Atherosclerosis (ICD10-I70.0) and Emphysema (ICD10-J43.9). Electronically Signed   By: Amie Portland M.D.   On: 11/07/2020 15:34   CT  CORONARY MORPH W/CTA COR W/SCORE W/CA W/CM &/OR WO/CM  Addendum Date: 11/08/2020   ADDENDUM REPORT: 11/08/2020 15:06 CLINICAL DATA:  Chest pain EXAM: Cardiac CTA MEDICATIONS: Sub lingual nitro. 4mg  x 2 TECHNIQUE: The patient was scanned on a Siemens 192 slice scanner. Gantry rotation speed was 250 msecs. Collimation was 0.6 mm. A 100 kV prospective scan was triggered in the ascending thoracic aorta at 35-75% of the R-R interval. Average HR during the scan was 60 bpm. The 3D data set was interpreted on a dedicated work station using MPR, MIP and VRT modes. A total of 80cc of contrast was used. FINDINGS: Non-cardiac: See separate report from [facility]. Pulmonary veins drain normally to the left atrium. No LA appendage thrombus. Calcium Score: 4,057 Agatston units. Coronary Arteries: Right dominant with no anomalies LM: Calcified plaque distal vessel, mild (<50%) stenosis. LAD system: Large high D1 with extensive calcified plaque proximally, probably mild (<50%) stenosis. Extensive calcified plaque in the proximal to mid LAD. Difficult to grade stenosis due to blooming artifact, but probably mild (<50%) stenosis. Circumflex system: Extensive calcified plaque in the proximal and mid vessel. Difficult to comment on degree of stenosis due to blooming artifact. RCA system: Extensive calcified plaque throughout the RCA. Suspect no more than mild (<50%) stenosis. Extensive calcified plaque throughout the PLV branch, suspect mild (<50%) stenosis though small distal branches were still diseased but not well-visualized. The ostial PDA is calcified, cannot comment on degree of stenosis due to blooming artifact. The remainder of the proximal-mid PDA has mild (<50%) stenosis. The distal branches were still diseased but not well-visualized. IMPRESSION: 1. Coronary artery calcium score 4057 Agatston units. This places the patient in  the 97th percentile for age and gender, suggesting high risk for future cardiac events.  2. Very difficult study to interpret due to very high calcium score. 3. Suspect there is not hemodynamically significant stenosis in the RCA, but difficult to comment on the more distal PDA and PLV due to blooming artifact. 4.  There does not appear to be severe disease in the LAD system. 5. Difficult to comment on proximal to mid LCX due to extensive blooming artifact. Will send for FFR. Dalton Mclean Electronically Signed   By: Marca Ancona M.D.   On: 11/08/2020 15:06   Result Date: 11/08/2020 EXAM: OVER-READ INTERPRETATION  CT CHEST The following report is an over-read performed by radiologist Dr. Trudie Reed of [facility] on 11/08/2020. This over-read does not include interpretation of cardiac or coronary anatomy or pathology. The coronary calcium score/coronary CTA interpretation by the cardiologist is attached. COMPARISON:  Chest CTA 11/07/2020. FINDINGS: Aortic atherosclerosis. Within the visualized portions of the thorax there are no suspicious appearing pulmonary nodules or masses, there is no acute consolidative airspace disease, no pleural effusions, no pneumothorax and no lymphadenopathy. Visualized portions of the upper abdomen are unremarkable. There are no aggressive appearing lytic or blastic lesions noted in the visualized portions of the skeleton. IMPRESSION: 1.  Aortic Atherosclerosis (ICD10-I70.0). Electronically Signed: By: Trudie Reed M.D. On: 11/08/2020 14:13   DG Chest Port 1 View  Result Date: 11/07/2020 CLINICAL DATA:  Acute chest pain at church EXAM: PORTABLE CHEST 1 VIEW COMPARISON:  09/08/2018 FINDINGS: Hyperinflation compatible with background COPD/emphysema. Normal heart size and vascularity. Minor right base atelectasis or scarring. No acute pneumonia, collapse or consolidation. Negative for edema, effusion or pneumothorax. Trachea midline. Aorta atherosclerotic. Degenerative changes of the spine. IMPRESSION: COPD/emphysema. Minor right base atelectasis  or scarring No acute process by plain radiography Aortic Atherosclerosis (ICD10-I70.0). Electronically Signed   By: Judie Petit.  Shick M.D.   On: 11/07/2020 13:15   CT CORONARY FRACTIONAL FLOW RESERVE FLUID ANALYSIS  Result Date: 11/09/2020 CLINICAL DATA:  Chest pain EXAM: CT FFR MEDICATIONS: No additional medications TECHNIQUE: The coronary CTA was sent for FFR FINDINGS: FFR 0.93 distal RCA.  FFR 0.77 branch of PLV.  FFR 0.92 PDA FFR 0.86 distal LAD FFR 0.83 distal D1 FFR 0.78 distal LCx IMPRESSION: Possible hemodynamically significant stenosis in a branch off the PLV. Borderline hemodynamically significant stenosis in the mid LCx. Depending on symptoms, these findings could be managed medically. Dalton Mclean Electronically Signed   By: Marca Ancona M.D.   On: 11/09/2020 13:38   ECHOCARDIOGRAM COMPLETE  Result Date: 11/08/2020    ECHOCARDIOGRAM REPORT   Patient Name:   Jacob Ruiz Date of Exam: 11/08/2020 Medical Rec #:  [id number]       Height:       72.0 in Accession #:    [id number]      Weight:       191.1 lb Date of Birth:  09/02/1946       BSA:          2.090 m Patient Age:    74 years        BP:           155/82 mmHg Patient Gender: M               HR:           61 bpm. Exam Location:  Inpatient Procedure: 2D Echo, Cardiac Doppler and Color Doppler Indications:    R07.9* Chest pain,  unspecified  History:        Patient has no prior history of Echocardiogram examinations.                 Signs/Symptoms:Chest Pain.  Sonographer:    Eulah Pont RDCS Referring Phys: [id number] Emeline General IMPRESSIONS  1. Left ventricular ejection fraction, by estimation, is 60 to 65%. The left ventricle has normal function. The left ventricle has no regional wall motion abnormalities. There is mild left ventricular hypertrophy. Left ventricular diastolic parameters are consistent with Grade I diastolic dysfunction (impaired relaxation).  2. Right ventricular systolic function is normal. The right ventricular size is normal.  Tricuspid regurgitation signal is inadequate for assessing PA pressure.  3. The mitral valve is normal in structure. Trivial mitral valve regurgitation. No evidence of mitral stenosis.  4. The aortic valve was not well visualized. Aortic valve regurgitation is not visualized. Mild aortic valve sclerosis is present, with no evidence of aortic valve stenosis.  5. The inferior vena cava is normal in size with greater than 50% respiratory variability, suggesting right atrial pressure of 3 mmHg.  6. Technically difficult study with poor acoustic windows. FINDINGS  Left Ventricle: Left ventricular ejection fraction, by estimation, is 60 to 65%. The left ventricle has normal function. The left ventricle has no regional wall motion abnormalities. The left ventricular internal cavity size was normal in size. There is  mild left ventricular hypertrophy. Left ventricular diastolic parameters are consistent with Grade I diastolic dysfunction (impaired relaxation). Right Ventricle: The right ventricular size is normal. No increase in right ventricular wall thickness. Right ventricular systolic function is normal. Tricuspid regurgitation signal is inadequate for assessing PA pressure. Left Atrium: Left atrial size was normal in size. Right Atrium: Right atrial size was normal in size. Pericardium: There is no evidence of pericardial effusion. Mitral Valve: The mitral valve is normal in structure. Trivial mitral valve regurgitation. No evidence of mitral valve stenosis. Tricuspid Valve: The tricuspid valve is normal in structure. Tricuspid valve regurgitation is trivial. Aortic Valve: The aortic valve was not well visualized. Aortic valve regurgitation is not visualized. Mild aortic valve sclerosis is present, with no evidence of aortic valve stenosis. Pulmonic Valve: The pulmonic valve was not well visualized. Pulmonic valve regurgitation is not visualized. Aorta: The aortic root is normal in size and structure. Venous: The  inferior vena cava is normal in size with greater than 50% respiratory variability, suggesting right atrial pressure of 3 mmHg. IAS/Shunts: No atrial level shunt detected by color flow Doppler.  LEFT VENTRICLE PLAX 2D LVIDd:         4.80 cm  Diastology LVIDs:         3.40 cm  LV e' medial:    7.18 cm/s LV PW:         0.80 cm  LV E/e' medial:  6.6 LV IVS:        0.80 cm  LV e' lateral:   8.27 cm/s LVOT diam:     2.00 cm  LV E/e' lateral: 5.7 LV SV:         72 LV SV Index:   34 LVOT Area:     3.14 cm  RIGHT VENTRICLE RV S prime:     17.40 cm/s TAPSE (M-mode): 2.8 cm LEFT ATRIUM             Index       RIGHT ATRIUM           Index LA diam:  3.10 cm 1.48 cm/m  RA Area:     15.00 cm LA Vol (A2C):   46.0 ml 22.01 ml/m RA Volume:   35.90 ml  17.18 ml/m LA Vol (A4C):   40.8 ml 19.52 ml/m LA Biplane Vol: 44.0 ml 21.05 ml/m  AORTIC VALVE LVOT Vmax:   102.00 cm/s LVOT Vmean:  68.700 cm/s LVOT VTI:    0.229 m  AORTA Ao Root diam: 3.20 cm MITRAL VALVE MV Area (PHT): 1.41 cm    SHUNTS MV Decel Time: 539 msec    Systemic VTI:  0.23 m MV E velocity: 47.30 cm/s  Systemic Diam: 2.00 cm MV A velocity: 60.00 cm/s MV E/A ratio:  0.79 Marca Anconaalton Mclean MD Electronically signed by Marca Anconaalton Mclean MD Signature Date/Time: 11/08/2020/2:05:43 PM    Final     Assessment and Plan:   1. Chest pain - mod CAD on cardiac CT w/ borderline FFR - Pt prefers med rx - start ASA, BB, Imdur - encourage smoking cessation - f/u in office, appt made  2.  HTN - Pt not on meds pta - advised that goal BP 130/80 - admits that he gets stressed and suspects BP probably runs up when he does - start BB and Imdur, continue amlodipine, make hydralazine prn. - keep BP log daily, vary the times - f/u in office  Active Problems:   Ischemic chest pain (HCC)   Chest pain     For questions or updates, please contact [facility] Please consult [email or web address] for contact info under Cardiology/STEMI.   Signed, Theodore DemarkRhonda Barrett, PA-C   11/09/2020 5:29 PM   Patient seen and examined with Theodore Demarkhonda Barrett, PA-C.  Agree as above, with the following exceptions and changes as noted below.  Patient is a 75 year old male with no significant medical contact who presents with typical angina.  Due to negative troponins on presentation, cardiac coronary CT was recommended despite the fact that there were coronary artery calcifications in a multivessel distribution.  This challenging study showed a calcium score of greater than 4000 with significant blooming artifact affecting interpretation.  FFR showed indeterminate hemodynamic lesions in the branch off of the P L and indeterminate hemodynamic stenosis in the mid left circumflex.  While this was a very concerning presentation of chest pain, patient has not been optimized from a medication standpoint and when discussed with the patient this is a strategy he would prefer initially.  In fact the patient would prefer not to take any medications and is having difficulty coming to terms with his coronary artery disease and overall health.  We discussed in great detail that medical management of CAD is at the core of treatment and a multi pronged approach with multiple medications is required and recommended for secondary prevention of CAD.  His wife is present at the bedside and is quite supportive.  Gen: NAD, CV: RRR, no murmurs, Lungs: clear, Abd: soft, Extrem: Warm, well perfused, no edema, Neuro/Psych: alert and oriented x 3, normal mood and affect. All available labs, radiology testing, previous records reviewed.  The patient is beyond eager to leave the hospital this evening.  I have already arranged follow-up in my clinic closely to begin to chip away at medication titration and follow-up of angina.  I have stressed to the patient that if he has recurrent chest pain he should come back to the ED.  With recurrent chest pain I would consider coronary angiography since CT was subjective blooming artifact  which significantly affects the interpretation.  He  is currently chest pain-free and we will start antianginal therapy as well as aspirin, statin.  In addition we will use beta-blocker, long-acting nitrate, as needed hydralazine for blood pressure greater than 160/100.  Agree with amlodipine 5 mg daily as well.  I have stressed to the patient in no uncertain terms that he has significant coronary artery calcifications and with recurrent chest pain he should come back to the ED with the intent for coronary angiography.  He understands, he is in good spirits, and plans to follow-up with me in the office.  I look forward to caring for him.  Parke Poisson, MD 11/09/20 6:30 PM

## 2020-11-11 DIAGNOSIS — S82035D Nondisplaced transverse fracture of left patella, subsequent encounter for closed fracture with routine healing: Secondary | ICD-10-CM | POA: Diagnosis not present

## 2020-12-01 ENCOUNTER — Ambulatory Visit: Payer: Medicare Other | Admitting: Internal Medicine

## 2020-12-06 ENCOUNTER — Other Ambulatory Visit: Payer: Self-pay

## 2020-12-06 ENCOUNTER — Encounter: Payer: Self-pay | Admitting: Internal Medicine

## 2020-12-06 ENCOUNTER — Ambulatory Visit: Payer: Medicare Other | Admitting: Internal Medicine

## 2020-12-06 VITALS — BP 150/68 | HR 60 | Ht 69.5 in | Wt 198.2 lb

## 2020-12-06 DIAGNOSIS — Z79899 Other long term (current) drug therapy: Secondary | ICD-10-CM | POA: Diagnosis not present

## 2020-12-06 DIAGNOSIS — E7800 Pure hypercholesterolemia, unspecified: Secondary | ICD-10-CM | POA: Diagnosis not present

## 2020-12-06 DIAGNOSIS — I10 Essential (primary) hypertension: Secondary | ICD-10-CM

## 2020-12-06 DIAGNOSIS — I25118 Atherosclerotic heart disease of native coronary artery with other forms of angina pectoris: Secondary | ICD-10-CM | POA: Diagnosis not present

## 2020-12-06 MED ORDER — AMLODIPINE BESYLATE 10 MG PO TABS: 10.0000 mg | ORAL_TABLET | Freq: Every day | ORAL | 3 refills | Status: DC

## 2020-12-06 MED ORDER — NITROGLYCERIN 0.4 MG SL SUBL: 0.4000 mg | SUBLINGUAL_TABLET | SUBLINGUAL | 3 refills | Status: AC | PRN

## 2020-12-06 NOTE — Progress Notes (Signed)
Cardiology Office Note:    Date:  12/06/2020   ID:  Jacob Ruiz, DOB 1945-12-07, MRN [id number]  PCP:  Sigmund Hazel, MD  Cardiologist:  No primary care provider on file.  Electrophysiologist:  None   Referring MD: Sigmund Hazel, MD   Chief Complaint/Reason for Referral: Follow up hospitalization  History of Present Illness:    Jacob Ruiz is a 75 y.o. male with a history of HTN (previously non-compliant with medical therapy), COPD, tobacco use, who is being seen today for follow up evaluation of chest pain.   He was found to have moderate CAD with significant CAC on cardiac CT with borderline FFR values.  We agreed upon medical therapy while he was in the hospital.  Recommended ASA 81 mg daily, beta-blocker, Imdur.  Encourage smoking cessation, for hypertension continued amlodipine and as needed hydralazine.  Encouraged him to keep a BP log leaving the hospital.  Patient is hopeful for stabilization of coronary artery disease with medical therapy.  He relies heavily on his faith. Today he is feeling well overall.  He notes occasional mild chest pain that is substernal.  No dyspnea at rest or with exertion, palpitations, PND, orthopnea, or leg swelling. Denies cough, fever, chills. Denies nausea, vomiting. Denies syncope or presyncope. Denies dizziness or lightheadedness.  Past Medical History:  Diagnosis Date  . Chronic bronchitis (HCC)    "get it just about q yr" (03/26/2014)  . H/O hiatal hernia     Past Surgical History:  Procedure Laterality Date  . KNEE ARTHROSCOPY Right 1970's  . TONSILLECTOMY  1950's    Current Medications: Current Meds  Medication Sig  . acetaminophen (TYLENOL) 500 MG tablet Take 1,000 mg by mouth every 6 (six) hours as needed for mild pain.  Marland Kitchen atorvastatin (LIPITOR) 20 MG tablet Take 1 tablet (20 mg total) by mouth daily.  . isosorbide mononitrate (IMDUR) 30 MG 24 hr tablet Take 1 tablet (30 mg total) by mouth daily.  . metoprolol succinate  (TOPROL-XL) 25 MG 24 hr tablet Take 1 tablet (25 mg total) by mouth daily.  . nicotine (NICODERM CQ - DOSED IN MG/24 HOURS) 21 mg/24hr patch Place 1 patch (21 mg total) onto the skin daily.  . nitroGLYCERIN (NITROSTAT) 0.4 MG SL tablet Place 1 tablet (0.4 mg total) under the tongue every 5 (five) minutes as needed for chest pain. YOU MAY TAKE (1) TABLET EVERY 5 MINS AS NEEDED FOR CHEST PAIN. DO NOT EXCEED 3 DOSES.  . [DISCONTINUED] albuterol (VENTOLIN HFA) 108 (90 Base) MCG/ACT inhaler Inhale 1 puff into the lungs every 4 (four) hours as needed for wheezing or shortness of breath.  . [DISCONTINUED] amLODipine (NORVASC) 5 MG tablet Take 1 tablet (5 mg total) by mouth daily.     Allergies:   Lisinopril, Ace inhibitors, and Aleve [naproxen sodium]   Social History   Tobacco Use  . Smoking status: Current Every Day Smoker    Packs/day: 2.00    Years: 54.00    Pack years: 108.00    Types: Cigarettes  . Smokeless tobacco: Never Used  Substance Use Topics  . Alcohol use: No  . Drug use: No     Family History: [name] history includes Heart disease in his father; Heart failure in his mother.  ROS:   Please see the history of present illness.    All other systems reviewed and are negative.  EKGs/Labs/Other Studies Reviewed:    The following studies were reviewed today:  EKG:  NSR, iRBBB, septal infarct pattern.  Recent Labs: 11/07/2020: ALT 13; BUN 9; Creatinine, Ser 0.58; Hemoglobin 14.2; Platelets 218; Potassium 4.0; Sodium 137  Recent Lipid Panel    Component Value Date/Time   CHOL 197 11/07/2020 1550   TRIG 76 11/07/2020 1550   TRIG 55 07/30/2006 0958   HDL 64 11/07/2020 1550   CHOLHDL 3.1 11/07/2020 1550   VLDL 15 11/07/2020 1550   LDLCALC 118 (H) 11/07/2020 1550   LDLDIRECT 150.1 07/30/2006 0958    Physical Exam:    VS:  BP (!) 150/68   Pulse 60   Ht 5' 9.5" (1.765 m)   Wt 198 lb 3.2 oz (89.9 kg)   SpO2 93%   BMI 28.85 kg/m     Wt Readings from  Last 5 Encounters:  11/07/20 191 lb 2.2 oz (86.7 kg)  09/08/18 168 lb (76.2 kg)  03/27/14 186 lb 4.8 oz (84.5 kg)    Constitutional: No acute distress Eyes: sclera non-icteric, normal conjunctiva and lids ENMT: normal dentition, moist mucous membranes Cardiovascular: regular rhythm, normal rate, soft systolic murmur. S1 and S2 normal. Radial pulses normal bilaterally. No jugular venous distention.  Respiratory: clear to auscultation bilaterally GI : normal bowel sounds, soft and nontender. No distention.   MSK: extremities warm, well perfused. No edema.  NEURO: grossly nonfocal exam, moves all extremities. PSYCH: alert and oriented x 3, normal mood and affect.   ASSESSMENT:    1. Coronary artery disease of native artery of native heart with stable angina pectoris (HCC)   2. Primary hypertension   3. Medication management   4. Pure hypercholesterolemia    PLAN:    Coronary artery disease of native artery of native heart with stable angina pectoris (HCC) Primary hypertension - Plan: EKG 12-Lead Medication management - Plan: Lipid panel, Comprehensive metabolic panel Pure hypercholesterolemia -Noted to have significant coronary artery calcifications but moderate likely nonobstructive CAD on coronary CTA.  We have discussed that any recurrent chest pain warrants presentation to the ER for evaluation of ischemia. -Continue aspirin 81 mg daily -Continue atorvastatin 20 mg daily with plans for up titration -Continue metoprolol succinate 25 mg daily -Continue Imdur 30 mg daily -Patient's blood pressure is not well controlled today which may contribute to chest pain, increased dose of amlodipine to 10 mg daily. -We will prescribe nitroglycerin and we have given him ER precautions for nitroglycerin use. -Encourage smoking cessation  Exercise recommendations: Goal of exercising for at least 30 minutes a day, at least 5 times per week.  Please exercise to a moderate exertion.  This means  that while exercising it is difficult to speak in full sentences, however you are not so short of breath that you feel you must stop, and not so comfortable that you can carry on a full conversation.  Exertion level should be approximately a 5/10, if 10 is the most exertion you can perform.  Diet recommendations: Recommend a heart healthy diet such as the Mediterranean diet.  This diet consists of plant based foods, healthy fats, lean meats, olive oil.  It suggests limiting the intake of simple carbohydrates such as white breads, pastries, and pastas.  It also limits the amount of red meat, wine, and dairy products such as cheese that one should consume on a daily basis.   Total time of encounter: 50 minutes total time of encounter, including 40 minutes spent in face-to-face patient care on the date of this encounter. This time includes coordination of care and counseling regarding above  mentioned problem list. Remainder of non-face-to-face time involved reviewing chart documents/testing relevant to the patient encounter and documentation in the medical record. I have independently reviewed documentation from referring provider.   Weston Brass, MD, [facility]  [facility]    Medication Adjustments/Labs and Tests Ordered: Current medicines are reviewed at length with the patient today.  Concerns regarding medicines are outlined above.   Orders Placed This Encounter  Procedures  . Lipid panel  . Comprehensive metabolic panel  . EKG 12-Lead    Meds ordered this encounter  Medications  . amLODipine (NORVASC) 10 MG tablet    Sig: Take 1 tablet (10 mg total) by mouth daily.    Dispense:  30 tablet    Refill:  3  . nitroGLYCERIN (NITROSTAT) 0.4 MG SL tablet    Sig: Place 1 tablet (0.4 mg total) under the tongue every 5 (five) minutes as needed for chest pain. YOU MAY TAKE (1) TABLET EVERY 5 MINS AS NEEDED FOR CHEST PAIN. DO NOT EXCEED 3 DOSES.    Dispense:  90 tablet    Refill:   3    Patient Instructions  Medication Instructions:  PLEASE USE NITROGLYCERIN AS NEEDED FOR CHEST PAIN- YOU MAY TAKE (1) TABLET EVERY 5 MINS (PLACE TABLET UNDER THE TONGUE AND LET FULLY DISSOLVE) FOR UP TO 3 DOSES  INCREASE AMLODIPINE TO 10mg  (1) TABLET DAILY- PLEASE PICK UP NEW PRESCRIPTION FROM PHARMACY   *If you need a refill on your cardiac medications before your next appointment, please call your pharmacy*  Lab Work: LIPIDS AND CMP RIGHT BEFORE NEXT VISIT- PLEASE RETURN TO OUR OFFICE FOR THIS  If you have labs (blood work) drawn today and your tests are completely normal, you will receive your results only by: MyChart Message (if you have MyChart) OR . A paper copy in the mail If you have any lab test that is abnormal or we need to change your treatment, we will call you to review the results.  Follow-Up: At [facility], you and your health needs are our priority.  As part of our continuing mission to provide you with exceptional heart care, we have created designated Provider Care Teams.  These Care Teams include your primary Cardiologist (physician) and Advanced Practice Providers (APPs -  Physician Assistants and Nurse Practitioners) who all work together to provide you with the care you need, when you need it.  Your next appointment:   3 month(s)  The format for your next appointment:   In Person  Provider:   [facility], MD  Other Instructions PLEASE GO TO THE EMERGENCY DEPARTMENT WITH SEVERE CHEST PAIN  IF YOU ARE CONCERNED ABOUT YOUR SYMPTOMS PLEASE CALL OUR OFFICE [phone] Nitroglycerin sublingual tablets What is this medicine? NITROGLYCERIN (nye troe GLI ser in) is a type of vasodilator. It relaxes blood vessels, increasing the blood and oxygen supply to your heart. This medicine is used to relieve chest pain caused by angina. It is also used to prevent chest pain before activities like climbing stairs, going outdoors in cold weather, or sexual  activity. This medicine may be used for other purposes; ask your health care provider or pharmacist if you have questions. COMMON BRAND NAME(S): Nitroquick, Nitrostat, Nitrotab What should I tell my health care provider before I take this medicine? They need to know if you have any of these conditions:  anemia  head injury, recent stroke, or bleeding in the brain  liver disease  previous heart attack  an  unusual or allergic reaction to nitroglycerin, other medicines, foods, dyes, or preservatives  pregnant or trying to get pregnant  breast-feeding How should I use this medicine? Take this medicine by mouth as needed. Use at the first sign of an angina attack (chest pain or tightness). You can also take this medicine 5 to 10 minutes before an event likely to produce chest pain. Follow the directions exactly as written on the prescription label. Place one tablet under your tongue and let it dissolve. Do not swallow whole. Replace the dose if you accidentally swallow it. It will help if your mouth is not dry. Saliva around the tablet will help it to dissolve more quickly. Do not eat or drink, smoke or chew tobacco while a tablet is dissolving. Sit down when taking this medicine. In an angina attack, you should feel better within 5 minutes after your first dose. You can take a dose every 5 minutes up to a total of 3 doses. If you do not feel better or feel worse after 1 dose, call 9-1-1 at once. Do not take more than 3 doses in 15 minutes. Your health care provider might give you other directions. Follow those directions if he or she does. Do not take your medicine more often than directed. Talk to your health care provider about the use of this medicine in children. Special care may be needed. Overdosage: If you think you have taken too much of this medicine contact a poison control center or emergency room at once. NOTE: This medicine is only for you. Do not share this medicine with  others.  What if I miss a dose? This does not apply. This medicine is only used as needed. What may interact with this medicine? Do not take this medicine with any of the following medications:  certain migraine medicines like ergotamine and dihydroergotamine (DHE)  medicines used to treat erectile dysfunction like sildenafil, tadalafil, and vardenafil  riociguat This medicine may also interact with the following medications:  alteplase  aspirin  heparin  medicines for high blood pressure  medicines for mental depression  other medicines used to treat angina  phenothiazines like chlorpromazine, mesoridazine, prochlorperazine, thioridazine This list may not describe all possible interactions. Give your health care provider a list of all the medicines, herbs, non-prescription drugs, or dietary supplements you use. Also tell them if you smoke, drink alcohol, or use illegal drugs. Some items may interact with your medicine. What should I watch for while using this medicine? Tell your doctor or health care professional if you feel your medicine is no longer working. Keep this medicine with you at all times. Sit or lie down when you take your medicine to prevent falling if you feel dizzy or faint after using it. Try to remain calm. This will help you to feel better faster. If you feel dizzy, take several deep breaths and lie down with your feet propped up, or bend forward with your head resting between your knees. You may get drowsy or dizzy. Do not drive, use machinery, or do anything that needs mental alertness until you know how this drug affects you. Do not stand or sit up quickly, especially if you are an older patient. This reduces the risk of dizzy or fainting spells. Alcohol can make you more drowsy and dizzy. Avoid alcoholic drinks. Do not treat yourself for coughs, colds, or pain while you are taking this medicine without asking your doctor or health care professional for advice.  Some ingredients may  increase your blood pressure. What side effects may I notice from receiving this medicine? Side effects that you should report to your doctor or health care professional as soon as possible:  allergic reactions (skin rash, itching or hives; swelling of the face, lips, or tongue)  low blood pressure (dizziness; feeling faint or lightheaded, falls; unusually weak or tired)  low red blood cell counts (trouble breathing; feeling faint; lightheaded, falls; unusually weak or tired) Side effects that usually do not require medical attention (report to your doctor or health care professional if they continue or are bothersome):  facial flushing (redness)  headache  nausea, vomiting This list may not describe all possible side effects. Call your doctor for medical advice about side effects. You may report side effects to FDA at 1-800-FDA-1088. Where should I keep my medicine? Keep out of the reach of children. Store at room temperature between 20 and 25 degrees C (68 and 77 degrees F). Store in Retail buyer. Protect from light and moisture. Keep tightly closed. Throw away any unused medicine after the expiration date. NOTE: This sheet is a summary. It may not cover all possible information. If you have questions about this medicine, talk to your doctor, pharmacist, or health care provider.  2021 Elsevier/Gold Standard (2018-06-12 16:46:32)

## 2020-12-06 NOTE — Patient Instructions (Addendum)
Medication Instructions:  PLEASE USE NITROGLYCERIN AS NEEDED FOR CHEST PAIN- YOU MAY TAKE (1) TABLET EVERY 5 MINS (PLACE TABLET UNDER THE TONGUE AND LET FULLY DISSOLVE) FOR UP TO 3 DOSES  INCREASE AMLODIPINE TO 10mg  (1) TABLET DAILY- PLEASE PICK UP NEW PRESCRIPTION FROM PHARMACY   *If you need a refill on your cardiac medications before your next appointment, please call your pharmacy*  Lab Work: LIPIDS AND CMP RIGHT BEFORE NEXT VISIT- PLEASE RETURN TO OUR OFFICE FOR THIS  If you have labs (blood work) drawn today and your tests are completely normal, you will receive your results only by: MyChart Message (if you have MyChart) OR . A paper copy in the mail If you have any lab test that is abnormal or we need to change your treatment, we will call you to review the results.  Follow-Up: At [facility], you and your health needs are our priority.  As part of our continuing mission to provide you with exceptional heart care, we have created designated Provider Care Teams.  These Care Teams include your primary Cardiologist (physician) and Advanced Practice Providers (APPs -  Physician Assistants and Nurse Practitioners) who all work together to provide you with the care you need, when you need it.  Your next appointment:   3 month(s)  The format for your next appointment:   In Person  Provider:   [facility], MD  Other Instructions PLEASE GO TO THE EMERGENCY DEPARTMENT WITH SEVERE CHEST PAIN  IF YOU ARE CONCERNED ABOUT YOUR SYMPTOMS PLEASE CALL OUR OFFICE [phone] Nitroglycerin sublingual tablets What is this medicine? NITROGLYCERIN (nye troe GLI ser in) is a type of vasodilator. It relaxes blood vessels, increasing the blood and oxygen supply to your heart. This medicine is used to relieve chest pain caused by angina. It is also used to prevent chest pain before activities like climbing stairs, going outdoors in cold weather, or sexual activity. This medicine may be used  for other purposes; ask your health care provider or pharmacist if you have questions. COMMON BRAND NAME(S): Nitroquick, Nitrostat, Nitrotab What should I tell my health care provider before I take this medicine? They need to know if you have any of these conditions:  anemia  head injury, recent stroke, or bleeding in the brain  liver disease  previous heart attack  an unusual or allergic reaction to nitroglycerin, other medicines, foods, dyes, or preservatives  pregnant or trying to get pregnant  breast-feeding How should I use this medicine? Take this medicine by mouth as needed. Use at the first sign of an angina attack (chest pain or tightness). You can also take this medicine 5 to 10 minutes before an event likely to produce chest pain. Follow the directions exactly as written on the prescription label. Place one tablet under your tongue and let it dissolve. Do not swallow whole. Replace the dose if you accidentally swallow it. It will help if your mouth is not dry. Saliva around the tablet will help it to dissolve more quickly. Do not eat or drink, smoke or chew tobacco while a tablet is dissolving. Sit down when taking this medicine. In an angina attack, you should feel better within 5 minutes after your first dose. You can take a dose every 5 minutes up to a total of 3 doses. If you do not feel better or feel worse after 1 dose, call 9-1-1 at once. Do not take more than 3 doses in 15 minutes. Your health care provider  might give you other directions. Follow those directions if he or she does. Do not take your medicine more often than directed. Talk to your health care provider about the use of this medicine in children. Special care may be needed. Overdosage: If you think you have taken too much of this medicine contact a poison control center or emergency room at once. NOTE: This medicine is only for you. Do not share this medicine with others.  What if I miss a dose? This does not  apply. This medicine is only used as needed. What may interact with this medicine? Do not take this medicine with any of the following medications:  certain migraine medicines like ergotamine and dihydroergotamine (DHE)  medicines used to treat erectile dysfunction like sildenafil, tadalafil, and vardenafil  riociguat This medicine may also interact with the following medications:  alteplase  aspirin  heparin  medicines for high blood pressure  medicines for mental depression  other medicines used to treat angina  phenothiazines like chlorpromazine, mesoridazine, prochlorperazine, thioridazine This list may not describe all possible interactions. Give your health care provider a list of all the medicines, herbs, non-prescription drugs, or dietary supplements you use. Also tell them if you smoke, drink alcohol, or use illegal drugs. Some items may interact with your medicine. What should I watch for while using this medicine? Tell your doctor or health care professional if you feel your medicine is no longer working. Keep this medicine with you at all times. Sit or lie down when you take your medicine to prevent falling if you feel dizzy or faint after using it. Try to remain calm. This will help you to feel better faster. If you feel dizzy, take several deep breaths and lie down with your feet propped up, or bend forward with your head resting between your knees. You may get drowsy or dizzy. Do not drive, use machinery, or do anything that needs mental alertness until you know how this drug affects you. Do not stand or sit up quickly, especially if you are an older patient. This reduces the risk of dizzy or fainting spells. Alcohol can make you more drowsy and dizzy. Avoid alcoholic drinks. Do not treat yourself for coughs, colds, or pain while you are taking this medicine without asking your doctor or health care professional for advice. Some ingredients may increase your blood  pressure. What side effects may I notice from receiving this medicine? Side effects that you should report to your doctor or health care professional as soon as possible:  allergic reactions (skin rash, itching or hives; swelling of the face, lips, or tongue)  low blood pressure (dizziness; feeling faint or lightheaded, falls; unusually weak or tired)  low red blood cell counts (trouble breathing; feeling faint; lightheaded, falls; unusually weak or tired) Side effects that usually do not require medical attention (report to your doctor or health care professional if they continue or are bothersome):  facial flushing (redness)  headache  nausea, vomiting This list may not describe all possible side effects. Call your doctor for medical advice about side effects. You may report side effects to FDA at 1-800-FDA-1088. Where should I keep my medicine? Keep out of the reach of children. Store at room temperature between 20 and 25 degrees C (68 and 77 degrees F). Store in Retail buyer. Protect from light and moisture. Keep tightly closed. Throw away any unused medicine after the expiration date. NOTE: This sheet is a summary. It may not cover all possible information.  If you have questions about this medicine, talk to your doctor, pharmacist, or health care provider.  2021 Elsevier/Gold Standard (2018-06-12 16:46:32)

## 2020-12-24 DIAGNOSIS — I219 Acute myocardial infarction, unspecified: Secondary | ICD-10-CM

## 2020-12-24 HISTORY — DX: Acute myocardial infarction, unspecified: I21.9

## 2020-12-29 ENCOUNTER — Emergency Department (HOSPITAL_COMMUNITY): Payer: Medicare Other

## 2020-12-29 ENCOUNTER — Inpatient Hospital Stay (HOSPITAL_COMMUNITY)
Admission: EM | Admit: 2020-12-29 | Discharge: 2021-01-01 | DRG: 247 | Disposition: A | Discharge status: Home / Self Care | Payer: Medicare Other | Attending: Internal Medicine | Admitting: Internal Medicine

## 2020-12-29 DIAGNOSIS — R7303 Prediabetes: Secondary | ICD-10-CM | POA: Diagnosis present

## 2020-12-29 DIAGNOSIS — K219 Gastro-esophageal reflux disease without esophagitis: Secondary | ICD-10-CM | POA: Diagnosis present

## 2020-12-29 DIAGNOSIS — Z6829 Body mass index (BMI) 29.0-29.9, adult: Secondary | ICD-10-CM | POA: Diagnosis not present

## 2020-12-29 DIAGNOSIS — E663 Overweight: Secondary | ICD-10-CM | POA: Diagnosis present

## 2020-12-29 DIAGNOSIS — F17210 Nicotine dependence, cigarettes, uncomplicated: Secondary | ICD-10-CM | POA: Diagnosis not present

## 2020-12-29 DIAGNOSIS — Z888 Allergy status to other drugs, medicaments and biological substances status: Secondary | ICD-10-CM | POA: Diagnosis not present

## 2020-12-29 DIAGNOSIS — I2584 Coronary atherosclerosis due to calcified coronary lesion: Secondary | ICD-10-CM | POA: Diagnosis not present

## 2020-12-29 DIAGNOSIS — G459 Transient cerebral ischemic attack, unspecified: Secondary | ICD-10-CM | POA: Diagnosis present

## 2020-12-29 DIAGNOSIS — M1990 Unspecified osteoarthritis, unspecified site: Secondary | ICD-10-CM | POA: Diagnosis not present

## 2020-12-29 DIAGNOSIS — R29818 Other symptoms and signs involving the nervous system: Secondary | ICD-10-CM | POA: Diagnosis not present

## 2020-12-29 DIAGNOSIS — Z8249 Family history of ischemic heart disease and other diseases of the circulatory system: Secondary | ICD-10-CM | POA: Diagnosis not present

## 2020-12-29 DIAGNOSIS — Z720 Tobacco use: Secondary | ICD-10-CM | POA: Diagnosis present

## 2020-12-29 DIAGNOSIS — Z743 Need for continuous supervision: Secondary | ICD-10-CM | POA: Diagnosis not present

## 2020-12-29 DIAGNOSIS — Z20822 Contact with and (suspected) exposure to covid-19: Secondary | ICD-10-CM | POA: Diagnosis present

## 2020-12-29 DIAGNOSIS — I252 Old myocardial infarction: Secondary | ICD-10-CM

## 2020-12-29 DIAGNOSIS — I2510 Atherosclerotic heart disease of native coronary artery without angina pectoris: Secondary | ICD-10-CM | POA: Diagnosis present

## 2020-12-29 DIAGNOSIS — Z79899 Other long term (current) drug therapy: Secondary | ICD-10-CM | POA: Diagnosis not present

## 2020-12-29 DIAGNOSIS — R079 Chest pain, unspecified: Secondary | ICD-10-CM | POA: Diagnosis not present

## 2020-12-29 DIAGNOSIS — R6889 Other general symptoms and signs: Secondary | ICD-10-CM | POA: Diagnosis not present

## 2020-12-29 DIAGNOSIS — I10 Essential (primary) hypertension: Secondary | ICD-10-CM | POA: Diagnosis not present

## 2020-12-29 DIAGNOSIS — I6523 Occlusion and stenosis of bilateral carotid arteries: Secondary | ICD-10-CM | POA: Diagnosis not present

## 2020-12-29 DIAGNOSIS — R531 Weakness: Secondary | ICD-10-CM

## 2020-12-29 DIAGNOSIS — I248 Other forms of acute ischemic heart disease: Secondary | ICD-10-CM | POA: Diagnosis present

## 2020-12-29 DIAGNOSIS — Z955 Presence of coronary angioplasty implant and graft: Secondary | ICD-10-CM

## 2020-12-29 DIAGNOSIS — I447 Left bundle-branch block, unspecified: Secondary | ICD-10-CM | POA: Diagnosis not present

## 2020-12-29 DIAGNOSIS — R0789 Other chest pain: Secondary | ICD-10-CM

## 2020-12-29 DIAGNOSIS — R4781 Slurred speech: Secondary | ICD-10-CM | POA: Diagnosis not present

## 2020-12-29 DIAGNOSIS — I214 Non-ST elevation (NSTEMI) myocardial infarction: Principal | ICD-10-CM | POA: Diagnosis present

## 2020-12-29 DIAGNOSIS — J449 Chronic obstructive pulmonary disease, unspecified: Secondary | ICD-10-CM | POA: Diagnosis present

## 2020-12-29 LAB — COMPREHENSIVE METABOLIC PANEL
ALT: 18 U/L (ref 0–44)
AST: 20 U/L (ref 15–41)
Albumin: 4.2 g/dL (ref 3.5–5.0)
Alkaline Phosphatase: 85 U/L (ref 38–126)
Anion gap: 8 (ref 5–15)
BUN: 17 mg/dL (ref 8–23)
CO2: 26 mmol/L (ref 22–32)
Calcium: 9.1 mg/dL (ref 8.9–10.3)
Chloride: 100 mmol/L (ref 98–111)
Creatinine, Ser: 0.97 mg/dL (ref 0.61–1.24)
GFR, Estimated: >60 mL/min (ref >60)
Glucose, Bld: 157 mg/dL — ABNORMAL HIGH (ref 70–99)
Potassium: 3.7 mmol/L (ref 3.5–5.1)
Sodium: 134 mmol/L — ABNORMAL LOW (ref 135–145)
Total Bilirubin: 0.8 mg/dL (ref 0.3–1.2)
Total Protein: 7.3 g/dL (ref 6.5–8.1)

## 2020-12-29 LAB — I-STAT CHEM 8, ED
BUN: 20 mg/dL (ref 8–23)
Calcium, Ion: 1.09 mmol/L — ABNORMAL LOW (ref 1.15–1.40)
Chloride: 99 mmol/L (ref 98–111)
Creatinine, Ser: 0.9 mg/dL (ref 0.61–1.24)
Glucose, Bld: 155 mg/dL — ABNORMAL HIGH (ref 70–99)
HCT: 43 % (ref 39.0–52.0)
Hemoglobin: 14.6 g/dL (ref 13.0–17.0)
Potassium: 3.7 mmol/L (ref 3.5–5.1)
Sodium: 135 mmol/L (ref 135–145)
TCO2: 26 mmol/L (ref 22–32)

## 2020-12-29 LAB — CBC
HCT: 43.1 % (ref 39.0–52.0)
Hemoglobin: 14.1 g/dL (ref 13.0–17.0)
MCH: 30 pg (ref 26.0–34.0)
MCHC: 32.7 g/dL (ref 30.0–36.0)
MCV: 91.7 fL (ref 80.0–100.0)
Platelets: 201 10*3/uL (ref 150–400)
RBC: 4.7 MIL/uL (ref 4.22–5.81)
RDW: 12.8 % (ref 11.5–15.5)
WBC: 8.5 10*3/uL (ref 4.0–10.5)
nRBC: 0 % (ref 0.0–0.2)

## 2020-12-29 LAB — DIFFERENTIAL
Abs Immature Granulocytes: 0.03 10*3/uL (ref 0.00–0.07)
Basophils Absolute: 0.1 10*3/uL (ref 0.0–0.1)
Basophils Relative: 1 %
Eosinophils Absolute: 0.1 10*3/uL (ref 0.0–0.5)
Eosinophils Relative: 1 %
Immature Granulocytes: 0 %
Lymphocytes Relative: 30 %
Lymphs Abs: 2.5 10*3/uL (ref 0.7–4.0)
Monocytes Absolute: 0.6 10*3/uL (ref 0.1–1.0)
Monocytes Relative: 7 %
Neutro Abs: 5.2 10*3/uL (ref 1.7–7.7)
Neutrophils Relative %: 61 %

## 2020-12-29 LAB — PROTIME-INR
INR: 1 (ref 0.8–1.2)
Prothrombin Time: 13.2 seconds (ref 11.4–15.2)

## 2020-12-29 LAB — TROPONIN I (HIGH SENSITIVITY)
Troponin I (High Sensitivity): 124 ng/L (ref <18)
Troponin I (High Sensitivity): 176 ng/L (ref <18)

## 2020-12-29 LAB — RESP PANEL BY RT-PCR (FLU A&B, COVID) ARPGX2
Influenza A by PCR: NEGATIVE
Influenza B by PCR: NEGATIVE
SARS Coronavirus 2 by RT PCR: NEGATIVE

## 2020-12-29 LAB — CBG MONITORING, ED: Glucose-Capillary: 145 mg/dL — ABNORMAL HIGH (ref 70–99)

## 2020-12-29 LAB — APTT: aPTT: 30 seconds (ref 24–36)

## 2020-12-29 MED ORDER — SODIUM CHLORIDE 0.9% FLUSH
3.0000 mL | Freq: Once | INTRAVENOUS | Status: DC
Start: 2020-12-29 — End: 2021-01-01

## 2020-12-29 MED ORDER — IOHEXOL 350 MG/ML SOLN
75.0000 mL | Freq: Once | INTRAVENOUS | Status: AC | PRN
  Administered 2020-12-29: 75 mL via INTRAVENOUS

## 2020-12-29 NOTE — Consult Note (Signed)
[facility] Consult Note   Primary Physician:   Sigmund Hazel, MD Primary Cardiologist:  Weston Brass, MD  Reason for Consultation: Chest pain  HPI:    Jacob Ruiz is a 75 year old gentleman with prior history of intermittent chest pain (last in the hospital in Feb 2022 with w/u suggesting moderate CAD per Coronary CT),  medication non-compliance, HTN, COPD, tobacco use disorder (still smoking), and chronic bronchitis, who was brought to the hospital for sharp/stabbing left sided chest pain. He also reported some left arm weakness. EMS documented slurring of speech. Code stroke called on arrival to ED. NIH stroke scale of 0. CT angio of head and neck showed no large vessel occlusion.  He was noted to have chest wall tenderness on exam in the ED. Hs-Troponins were 124 and 176. The ECG from 12/29/2020 showed sinus rhythm, rate 55 bpm, and Q waves in V1 and V2 (? old septal MI). Vital signs were: BP 142/74 mmHg, HR 55 bpm and RR 23 (O2 sat 92%).    ECHO: 11/08/2020 1. Left ventricular ejection fraction, by estimation, is 60 to 65%. The  left ventricle has normal function. The left ventricle has no regional  wall motion abnormalities. There is mild left ventricular hypertrophy.  Left ventricular diastolic parameters  are consistent with Grade I diastolic dysfunction (impaired relaxation).  2. Right ventricular systolic function is normal. The right ventricular  size is normal. Tricuspid regurgitation signal is inadequate for assessing  PA pressure.  3. The mitral valve is normal in structure. Trivial mitral valve  regurgitation. No evidence of mitral stenosis.  4. The aortic valve was not well visualized. Aortic valve regurgitation  is not visualized. Mild aortic valve sclerosis is present, with no  evidence of aortic valve stenosis.  5. The inferior vena cava is normal in size with greater than 50%  respiratory variability, suggesting right atrial pressure of 3 mmHg.   6. Technically difficult study with poor acoustic windows.   CARDIAC CTA: 11/09/2020 FINDINGS: FFR 0.93 distal RCA. FFR 0.77 branch of PLV. FFR 0.92 PDA  FFR 0.86 distal LAD  FFR 0.83 distal D1  FFR 0.78 distal LCx  IMPRESSION: Possible hemodynamically significant stenosis in a branch off the PLV. Borderline hemodynamically significant stenosis in the mid LCx. Depending on symptoms, these findings could be managed medically.  CT PE Protocol IMPRESSION: 1. No evidence of a pulmonary embolism. 2. No acute findings. 3. Minor right lower lobe dependent atelectasis. 4. Emphysema and aortic atherosclerosis well as three-vessel coronary artery calcifications.   Home Medications Prior to Admission medications   Medication Sig Start Date End Date Taking? Authorizing Provider  acetaminophen (TYLENOL) 500 MG tablet Take 1,000 mg by mouth every 6 (six) hours as needed for mild pain.    [provider]  albuterol (VENTOLIN HFA) 108 (90 Base) MCG/ACT inhaler Inhale 1 puff into the lungs every 4 (four) hours as needed for wheezing or shortness of breath. 09/30/20   [provider]  amLODipine (NORVASC) 10 MG tablet Take 1 tablet (10 mg total) by mouth daily. 12/06/20   Parke Poisson, MD  atorvastatin (LIPITOR) 20 MG tablet Take 1 tablet (20 mg total) by mouth daily. 11/10/20   Joseph Art, DO  isosorbide mononitrate (IMDUR) 30 MG 24 hr tablet Take 1 tablet (30 mg total) by mouth daily. 11/09/20   Joseph Art, DO  metoprolol succinate (TOPROL-XL) 25 MG 24 hr tablet Take 1 tablet (25 mg total) by mouth  daily. 11/09/20   Joseph Art, DO  nicotine (NICODERM CQ - DOSED IN MG/24 HOURS) 21 mg/24hr patch Place 1 patch (21 mg total) onto the skin daily. 11/10/20   Joseph Art, DO  nitroGLYCERIN (NITROSTAT) 0.4 MG SL tablet Place 1 tablet (0.4 mg total) under the tongue every 5 (five) minutes as needed for chest pain. YOU MAY TAKE (1) TABLET EVERY 5 MINS AS  NEEDED FOR CHEST PAIN. DO NOT EXCEED 3 DOSES. 12/06/20 03/06/21  Parke Poisson, MD    Past Medical History: Past Medical History:  Diagnosis Date  . Chronic bronchitis (HCC)    "get it just about q yr" (03/26/2014)  . H/O hiatal hernia     Past Surgical History: Past Surgical History:  Procedure Laterality Date  . KNEE ARTHROSCOPY Right 1970's  . TONSILLECTOMY  1950's    Family History: Family History  Problem Relation Age of Onset  . Heart failure Mother   . Heart disease Father     Social History: Social History   Socioeconomic History  . Marital status: Married    Spouse name: Not on file  . Number of children: Not on file  . Years of education: Not on file  . Highest education level: Not on file  Occupational History  . Not on file  Tobacco Use  . Smoking status: Current Every Day Smoker    Packs/day: 2.00    Years: 54.00    Pack years: 108.00    Types: Cigarettes  . Smokeless tobacco: Never Used  Substance and Sexual Activity  . Alcohol use: No  . Drug use: No  . Sexual activity: Not Currently  Other Topics Concern  . Not on file  Social History Narrative  . Not on file   Social Determinants of Health   Financial Resource Strain: Not on file  Food Insecurity: Not on file  Transportation Needs: Not on file  Physical Activity: Not on file  Stress: Not on file  Social Connections: Not on file    Allergies:  Allergies  Allergen Reactions  . Lisinopril Anaphylaxis  . Ace Inhibitors     unknown  . Aleve [Naproxen Sodium] Rash     Review of Systems: [y] = yes, [ ]  = no   . General: Weight gain [ ] ; Weight loss [ ] ; Anorexia [ ] ; Fatigue [ ] ; Fever [ ] ; Chills [ ] ; Weakness [ ]   . Cardiac: Chest pain/pressure [Y]; Resting SOB [ ] ; Exertional SOB [ ] ; Orthopnea [ ] ; Pedal Edema [ ] ; Palpitations [ ] ; Syncope [ ] ; Presyncope [ ] ; Paroxysmal nocturnal dyspnea[ ]   . Pulmonary: Cough [ ] ; Wheezing[ ] ; Hemoptysis[ ] ; Sputum [ ] ; Snoring [ ]   . GI:  Vomiting[ ] ; Dysphagia[ ] ; Melena[ ] ; Hematochezia [ ] ; Heartburn[ ] ; Abdominal pain [ ] ; Constipation [ ] ; Diarrhea [ ] ; BRBPR [ ]   . GU: Hematuria[ ] ; Dysuria [ ] ; Nocturia[ ]   . Vascular: Pain in legs with walking [ ] ; Pain in feet with lying flat [ ] ; Non-healing sores [ ] ; Stroke [ ] ; TIA [ ] ; Slurred speech [ ] ;  . Neuro: Headaches[ ] ; Vertigo[ ] ; Seizures[ ] ; Paresthesias[ ] ;Blurred vision [ ] ; Diplopia [ ] ; Vision changes [ ]   . Ortho/Skin: Arthritis [ ] ; Joint pain [ ] ; Muscle pain [ ] ; Joint swelling [ ] ; Back Pain [ ] ; Rash [ ]   . Psych: Depression[ ] ; Anxiety[ ]   . Heme: Bleeding problems [ ] ; Clotting disorders [ ] ; Anemia [ ]   .  Endocrine: Diabetes [ ] ; Thyroid dysfunction[ ]      Objective:    Vital Signs:   Temp:  [97.9 F (36.6 C)] 97.9 F (36.6 C) (04/06 1857) Pulse Rate:  [55-57] 55 (04/06 2100) Resp:  [20-23] 23 (04/06 2100) BP: (124-142)/(69-80) 142/74 (04/06 2100) SpO2:  [92 %-96 %] 92 % (04/06 2100) Weight:  [90.6 kg] 90.6 kg (04/06 1800)    Weight change: Filed Weights   12/29/20 1800  Weight: 90.6 kg    Intake/Output:  No intake or output data in the 24 hours ending 12/29/20 2247    Physical Exam    General:  Well appearing. No resp difficulty HEENT: normal Neck: supple. JVP . Carotids 2+ bilat; no bruits. No lymphadenopathy or thyromegaly appreciated. Cor: PMI nondisplaced. Regular rate & rhythm. No rubs, gallops or murmurs. Lungs: clear to auscultation bilaterally, positive chest wall tenderness to palpation Abdomen: soft, nontender, nondistended. No hepatosplenomegaly. No bruits or masses. Good bowel sounds. Extremities: no cyanosis, clubbing, rash, edema Neuro: alert & orientedx3, cranial nerves grossly intact. moves all 4 extremities w/o difficulty. Affect pleasant    EKG    The ECG from 12/29/2020, that I reviewed personally, showed sinus rhythm, rate 55 bpm, and Q waves in V1 and V2 (? old septal MI)  Labs   Basic Metabolic  Panel: Recent Labs  Lab 12/29/20 1825  NA 134*  135  K 3.7  3.7  CL 100  99  CO2 26  GLUCOSE 157*  155*  BUN 17  20  CREATININE 0.97  0.90  CALCIUM 9.1    Liver Function Tests: Recent Labs  Lab 12/29/20 1825  AST 20  ALT 18  ALKPHOS 85  BILITOT 0.8  PROT 7.3  ALBUMIN 4.2   No results for input(s): LIPASE, AMYLASE in the last 168 hours. No results for input(s): AMMONIA in the last 168 hours.  CBC: Recent Labs  Lab 12/29/20 1825  WBC 8.5  NEUTROABS 5.2  HGB 14.1  14.6  HCT 43.1  43.0  MCV 91.7  PLT 201    Cardiac Enzymes: No results for input(s): CKTOTAL, CKMB, CKMBINDEX, TROPONINI in the last 168 hours.  BNP: BNP (last 3 results) No results for input(s): BNP in the last 8760 hours.  ProBNP (last 3 results) No results for input(s): PROBNP in the last 8760 hours.   CBG: Recent Labs  Lab 12/29/20 1818  GLUCAP 145*    Coagulation Studies: Recent Labs    12/29/20 1825  LABPROT 13.2  INR 1.0     Imaging   DG Chest 2 View  Result Date: 12/29/2020 CLINICAL DATA:  75 year old male with chest pain. EXAM: CHEST - 2 VIEW COMPARISON:  Chest radiograph dated 11/07/2020. FINDINGS: No focal consolidation, pleural effusion or pneumothorax. The cardiac silhouette is within limits. Atherosclerotic calcification of the aorta. Degenerative changes of the spine. No acute osseous pathology. IMPRESSION: No active cardiopulmonary disease. Electronically Signed   By: Elgie CollardArash  Radparvar M.D.   On: 12/29/2020 19:59   CT HEAD CODE STROKE WO CONTRAST  Result Date: 12/29/2020 CLINICAL DATA:  Code stroke.  Left-sided weakness. EXAM: CT HEAD WITHOUT CONTRAST TECHNIQUE: Contiguous axial images were obtained from the base of the skull through the vertex without intravenous contrast. COMPARISON:  Head MRI 03/26/2014 FINDINGS: Brain: There is no evidence of an acute infarct, intracranial hemorrhage, mass, midline shift, or extra-axial fluid collection. The ventricles and  sulci are within normal limits for age. Hypodensities in the cerebral white matter bilaterally are nonspecific but  compatible with mild chronic small vessel ischemic disease. Vascular: Calcified atherosclerosis at the skull base. No hyperdense vessel. Skull: No acute fracture or suspicious osseous lesion. Sinuses/Orbits: Depression of the left lamina papyracea which may reflect an old medial orbital fracture. No evidence of acute inflammatory sinus disease. Clear mastoid air cells. Other: None. ASPECTS [facility] Stroke Program Early CT Score) - Ganglionic level infarction (caudate, lentiform nuclei, internal capsule, insula, M1-M3 cortex): 7 - Supraganglionic infarction (M4-M6 cortex): 3 Total score (0-10 with 10 being normal): 10 IMPRESSION: 1. No evidence of acute intracranial abnormality. 2. ASPECTS is 10. 3. Mild chronic small vessel ischemic disease. These results were communicated to Dr. Wilford Corner at 6:43 pm on 12/29/2020 by text page via the [facility] messaging system. Electronically Signed   By: Sebastian Ache M.D.   On: 12/29/2020 18:43   CT ANGIO HEAD CODE STROKE  Result Date: 12/29/2020 CLINICAL DATA:  Left-sided weakness.  Slurred speech. EXAM: CT ANGIOGRAPHY HEAD AND NECK TECHNIQUE: Multidetector CT imaging of the head and neck was performed using the standard protocol during bolus administration of intravenous contrast. Multiplanar CT image reconstructions and MIPs were obtained to evaluate the vascular anatomy. Carotid stenosis measurements (when applicable) are obtained utilizing NASCET criteria, using the distal internal carotid diameter as the denominator. CONTRAST:  17mL OMNIPAQUE IOHEXOL 350 MG/ML SOLN COMPARISON:  None. FINDINGS: CTA NECK FINDINGS Aortic arch: Standard 3 vessel aortic arch with mild atherosclerotic plaque. No significant arch vessel origin stenosis. Right carotid system: Patent with mild calcified plaque at the carotid bifurcation and in the proximal ICA resulting in less than 50% ICA  stenosis. Left carotid system: Patent with moderate calcified and soft plaque at the carotid bifurcation and in the proximal ICA resulting in 50% proximal ICA stenosis. Vertebral arteries: Patent with calcified plaque at both vertebral artery origins. No evidence of significant stenosis or dissection. Mildly dominant right vertebral artery. Skeleton: Widespread severe cervical facet arthrosis. Mild cervical disc degeneration. Other neck: Bilateral posterior cervical muscular atrophy. Upper chest: Mild centrilobular and paraseptal emphysema. Review of the MIP images confirms the above findings CTA HEAD FINDINGS Anterior circulation: The internal carotid arteries are patent from skull base to carotid termini with atherosclerotic plaque resulting in mild paraclinoid stenosis bilaterally. ACAs and MCAs are patent without evidence of a proximal branch occlusion or significant proximal stenosis. No aneurysm is identified. Posterior circulation: The intracranial vertebral arteries are patent to the basilar with mild atherosclerotic irregularity bilaterally but no significant stenosis. Patent PICA and SCA origins are seen bilaterally. The basilar artery is widely patent. Posterior communicating arteries are diminutive or absent. Both PCAs are patent without evidence of a significant proximal stenosis. There is a severe right P3 branch vessel stenosis. No aneurysm is identified. Venous sinuses: Not well evaluated due to arterial contrast timing. Anatomic variants: None. Review of the MIP images confirms the above findings IMPRESSION: 1. No large vessel occlusion. 2. Intracranial and cervical atherosclerosis resulting in mild bilateral intracranial ICA stenoses and 60% proximal left ICA stenosis. 3.  Aortic Atherosclerosis (ICD10-I70.0). These results were communicated to Dr. Wilford Corner at 6:43 pm on 12/29/2020 by text page via the [facility] messaging system. Electronically Signed   By: Sebastian Ache M.D.   On: 12/29/2020 19:15   CT  ANGIO NECK CODE STROKE  Result Date: 12/29/2020 CLINICAL DATA:  Left-sided weakness.  Slurred speech. EXAM: CT ANGIOGRAPHY HEAD AND NECK TECHNIQUE: Multidetector CT imaging of the head and neck was performed using the standard protocol during bolus administration of intravenous contrast.  Multiplanar CT image reconstructions and MIPs were obtained to evaluate the vascular anatomy. Carotid stenosis measurements (when applicable) are obtained utilizing NASCET criteria, using the distal internal carotid diameter as the denominator. CONTRAST:  75mL OMNIPAQUE IOHEXOL 350 MG/ML SOLN COMPARISON:  None. FINDINGS: CTA NECK FINDINGS Aortic arch: Standard 3 vessel aortic arch with mild atherosclerotic plaque. No significant arch vessel origin stenosis. Right carotid system: Patent with mild calcified plaque at the carotid bifurcation and in the proximal ICA resulting in less than 50% ICA stenosis. Left carotid system: Patent with moderate calcified and soft plaque at the carotid bifurcation and in the proximal ICA resulting in 50% proximal ICA stenosis. Vertebral arteries: Patent with calcified plaque at both vertebral artery origins. No evidence of significant stenosis or dissection. Mildly dominant right vertebral artery. Skeleton: Widespread severe cervical facet arthrosis. Mild cervical disc degeneration. Other neck: Bilateral posterior cervical muscular atrophy. Upper chest: Mild centrilobular and paraseptal emphysema. Review of the MIP images confirms the above findings CTA HEAD FINDINGS Anterior circulation: The internal carotid arteries are patent from skull base to carotid termini with atherosclerotic plaque resulting in mild paraclinoid stenosis bilaterally. ACAs and MCAs are patent without evidence of a proximal branch occlusion or significant proximal stenosis. No aneurysm is identified. Posterior circulation: The intracranial vertebral arteries are patent to the basilar with mild atherosclerotic irregularity  bilaterally but no significant stenosis. Patent PICA and SCA origins are seen bilaterally. The basilar artery is widely patent. Posterior communicating arteries are diminutive or absent. Both PCAs are patent without evidence of a significant proximal stenosis. There is a severe right P3 branch vessel stenosis. No aneurysm is identified. Venous sinuses: Not well evaluated due to arterial contrast timing. Anatomic variants: None. Review of the MIP images confirms the above findings IMPRESSION: 1. No large vessel occlusion. 2. Intracranial and cervical atherosclerosis resulting in mild bilateral intracranial ICA stenoses and 60% proximal left ICA stenosis. 3.  Aortic Atherosclerosis (ICD10-I70.0). These results were communicated to Dr. Wilford Corner at 6:43 pm on 12/29/2020 by text page via the [facility] messaging system. Electronically Signed   By: Sebastian Ache M.D.   On: 12/29/2020 19:15      Medications:     Current Medications: . sodium chloride flush  3 mL Intravenous Once     Infusions:     Assessment/Plan   1. Chest pain  Patient presented to the hospital today with acute onset of chest pain and left arm weakness.  Neurological work-up so far has been negative (this included a CT angiogram of the head and neck).  He has multiple risk factors for CAD and during his last hospitalization in February 2022 a coronary CTA was suggestive of moderate CAD.  Since he was not on adequate medical therapy he was advised aspirin and antianginal medications.  The plan was to see him in follow-up as an outpatient to gauge the effectiveness of medical therapy.  ECG during the current admission does not show any ST changes.  The high-sensitivity troponins are initially flat.  The patient is chest pain-free in the ED but does have chest wall tenderness on examination.  -Continue to trend cardiac enzymes -Serial ECGs -If patient has continued rising high-sensitivity troponins then start IV heparin (if stroke work-up  is negative) -Aspirin 81 mg daily -High intensity statins -Check a lipid panel -Continue metoprolol and amlodipine -Nitroglycerin as needed for chest pain -Keep NPO  His last CTA in February 2022 had shown distal, small vessel, borderline hemodynamically significant lesions.  Imaging yield was  limited due to presence of significant calcification.   Lonie Peak, MD  12/29/2020, 10:47 PM  Cardiology Overnight Team Please contact [facility] Cardiology for night-coverage after hours (4p -7a ) and weekends on [email or web address]

## 2020-12-29 NOTE — Consult Note (Addendum)
Neurology Consultation  Reason for Consult: Acute onset left-sided weakness, slurred speech-code stroke Referring Physician: Dr. Jacqulyn Bath, ED provider  CC: Chest pain, acute onset of left-sided weakness and slurred speech-brought in as a code stroke  History is obtained from: Patient, chart  HPI: Jacob Ruiz is a 75 y.o. male past medical history of unstable angina, with most recent admission for chest pains and discharge in February 2022, hypertension noncompliant with medications, COPD, cigarette smoker presenting for evaluation of left-sided weakness as a code stroke. EMS was called to the house because he started complaining of chest discomfort and upon their evaluation, they noted that he has some left arm and left leg weakness. This weakness was accompanied by shooting pains to his chest making him drop his arm and leg to the bed.  He reports the pain to be similar to what brought him into the hospital in February-and was diagnosed with unstable angina.  He was discharged on aspirin, recommended smoking cessation-he is taking aspirin but has not stopped smoking yet. He was evaluated emergently on the bridge as an acute code stroke. There was minimal left-sided weakness but most of it was weakness that was brought about by chest discomfort.  Upon asking him to grab his chest sharply and tied left the arm, there was no drift. See detailed exam below.  According to family, earlier in the day he had been working on a ceiling fan trying to replace a switch and was very frustrated with it.  He had been straining and after that was diaphoretic and uncomfortable for a while.  LKW: 5:30 PM tpa given?: no, likely not a stroke Premorbid modified Rankin scale (mRS): 0   ROS: Full ROS was performed and is negative except as noted in the HPI.  Past Medical History:  Diagnosis Date  . Chronic bronchitis (HCC)    "get it just about q yr" (03/26/2014)  . H/O hiatal hernia    Family History   Problem Relation Age of Onset  . Heart failure Mother   . Heart disease Father      Social History:   reports that he has been smoking cigarettes. He has a 108.00 pack-year smoking history. He has never used smokeless tobacco. He reports that he does not drink alcohol and does not use drugs.  Medications  Current Facility-Administered Medications:  .  sodium chloride flush (NS) 0.9 % injection 3 mL, 3 mL, Intravenous, Once, Long, Arlyss Repress, MD  Current Outpatient Medications:  .  acetaminophen (TYLENOL) 500 MG tablet, Take 1,000 mg by mouth every 6 (six) hours as needed for mild pain., Disp: , Rfl:  .  albuterol (VENTOLIN HFA) 108 (90 Base) MCG/ACT inhaler, Inhale 1 puff into the lungs every 4 (four) hours as needed for wheezing or shortness of breath., Disp: , Rfl:  .  amLODipine (NORVASC) 10 MG tablet, Take 1 tablet (10 mg total) by mouth daily., Disp: 30 tablet, Rfl: 3 .  atorvastatin (LIPITOR) 20 MG tablet, Take 1 tablet (20 mg total) by mouth daily., Disp: 30 tablet, Rfl: 1 .  isosorbide mononitrate (IMDUR) 30 MG 24 hr tablet, Take 1 tablet (30 mg total) by mouth daily., Disp: 30 tablet, Rfl: 0 .  metoprolol succinate (TOPROL-XL) 25 MG 24 hr tablet, Take 1 tablet (25 mg total) by mouth daily., Disp: 30 tablet, Rfl: 1 .  nicotine (NICODERM CQ - DOSED IN MG/24 HOURS) 21 mg/24hr patch, Place 1 patch (21 mg total) onto the skin daily., Disp: 28 patch,  Rfl: 0 .  nitroGLYCERIN (NITROSTAT) 0.4 MG SL tablet, Place 1 tablet (0.4 mg total) under the tongue every 5 (five) minutes as needed for chest pain. YOU MAY TAKE (1) TABLET EVERY 5 MINS AS NEEDED FOR CHEST PAIN. DO NOT EXCEED 3 DOSES., Disp: 90 tablet, Rfl: 3   Exam: Current vital signs: Ht 5' 9.5" (1.765 m)   Wt 90.6 kg   BMI 29.07 kg/m  Vital signs in last 24 hours: Weight:  [90.6 kg] 90.6 kg (04/06 1800)  General awake, alert, appears in some distress CC chest pain HEENT: Normocephalic atraumatic Lungs are clear Vascular:  Regular rate rhythm Abdomen nondistended nontender Extremities warm well perfused Neurologic exam Awake alert oriented x3 No dysarthria No aphasia Cranial: Pupils equal round reactive light, extraocular was intact, visual field full, face appears symmetric, facial sensation intact, tongue and palate midline. Motor exam: He has slowness of lifting his left arm and leg against gravity but grabs onto his chest indicating pain while trying to do that.  Upon passively lifting his leg or arm on the left side above the bed, he is able to keep it against gravity for 10 and 5 seconds respectively without drift.  Right side is normal without any drift 5/5 strength in the upper and lower extremity. Sensation intact to light touch without extinction Coordination: No dysmetria on the right-difficult perform on the left due to pain. Gait testing deferred NIH stroke scale: 0  Labs I have reviewed labs in epic and the results pertinent to this consultation are:   CBC    Component Value Date/Time   WBC 8.5 12/29/2020 1825   RBC 4.70 12/29/2020 1825   HGB 14.1 12/29/2020 1825   HGB 14.6 12/29/2020 1825   HCT 43.1 12/29/2020 1825   HCT 43.0 12/29/2020 1825   PLT 201 12/29/2020 1825   MCV 91.7 12/29/2020 1825   MCH 30.0 12/29/2020 1825   MCHC 32.7 12/29/2020 1825   RDW 12.8 12/29/2020 1825   LYMPHSABS 2.5 12/29/2020 1825   MONOABS 0.6 12/29/2020 1825   EOSABS 0.1 12/29/2020 1825   BASOSABS 0.1 12/29/2020 1825    CMP     Component Value Date/Time   NA 135 12/29/2020 1825   K 3.7 12/29/2020 1825   CL 99 12/29/2020 1825   CO2 23 11/07/2020 1230   GLUCOSE 155 (H) 12/29/2020 1825   BUN 20 12/29/2020 1825   CREATININE 0.90 12/29/2020 1825   CALCIUM 9.0 11/07/2020 1230   PROT 7.0 11/07/2020 1230   ALBUMIN 4.1 11/07/2020 1230   AST 15 11/07/2020 1230   ALT 13 11/07/2020 1230   ALKPHOS 72 11/07/2020 1230   BILITOT 0.8 11/07/2020 1230   GFRNONAA >60 11/07/2020 1230   GFRAA >60 09/08/2018  0650    Lipid Panel     Component Value Date/Time   CHOL 197 11/07/2020 1550   TRIG 76 11/07/2020 1550   TRIG 55 07/30/2006 0958   HDL 64 11/07/2020 1550   CHOLHDL 3.1 11/07/2020 1550   VLDL 15 11/07/2020 1550   LDLCALC 118 (H) 11/07/2020 1550   LDLDIRECT 150.1 07/30/2006 0958     Imaging I have reviewed the images obtained:  CT-scan of the brain-no bleed.  No evolving stroke.  Aspects 10. CTA head and neck preliminary review with no emergent LVO.  Assessment:  75 year old with history of unstable angina, coronary artery disease, hypertension noncompliant to medications, COPD and tobacco abuse presenting with sudden onset of left-sided chest pain followed by left arm and  leg weakness.  He is able to keep his arm and leg antigravity if passively lifted but upon trying to initiate movements, he reports stabbing and pressure type chest pain on the left. I doubt that this is a stroke-if it is it might be a pure lacunar infarct but his NIH stroke scale at this time is 0. He is not a candidate due to this likely not being stroke and a stroke scale. No LVO on imaging or no indication of LVO on clinical exam makes him not a candidate for EVT. Likely cardiac pain with radiation to the arm causing weakness. Given multiple risk factors-MRI would be helpful to rule out stroke  IMPRESSION  Left sided weakness in setting of chest pain - likely cardiac   Rule out stroke  Recommendations:  MRI brain without contrast-stat  If positive for stroke, will require admission for stroke work-up including 2D echo, A1c, lipid panel, neurochecks and telemetry.  Continue aspirin  If negative for stroke - no further neurological workup.  Cardiac work up per primary team.   [facility] MRI neg for stroke. No further inpatient neurology w/u. Cardiac w/u per primary team  -- Milon Dikes, MD Neurologist [facility] Pager: [phone]  CRITICAL CARE ATTESTATION Performed by:  Milon Dikes, MD Total critical care time: 55 minutes Critical care time was exclusive of separately billable procedures and treating other patients and/or supervising APPs/Residents/Students Critical care was necessary to treat or prevent imminent or life-threatening deterioration due to stroke like episode, chest pain, risk of stroke.  This patient is critically ill and at significant risk for neurological worsening and/or death and care requires constant monitoring. Critical care was time spent personally by me on the following activities: development of treatment plan with patient and/or surrogate as well as nursing, discussions with consultants, evaluation of patient's response to treatment, examination of patient, obtaining history from patient or surrogate, ordering and performing treatments and interventions, ordering and review of laboratory studies, ordering and review of radiographic studies, pulse oximetry, re-evaluation of patient's condition, participation in multidisciplinary rounds and medical decision making of high complexity in the care of this patient.

## 2020-12-29 NOTE — ED Notes (Signed)
Patient currently at MRI

## 2020-12-29 NOTE — Code Documentation (Signed)
Stroke Response Nurse Documentation Code Documentation  QURON RUDDY is a 75 y.o. male arriving to [facility] ED via [facility] on 12/29/2020 with past medical hx of COPD, cigarette smoker. Code stroke was activated by ED. Patient from home where he was LKW at 1730 and now complaining of left arm and left leg weakness. On No antithrombotic. Stroke team at the bedside on patient arrival. Labs drawn and patient cleared for CT by Dr. Rubin Payor. Patient to CT with team. NIHSS 0, see documentation for details and code stroke times. The following imaging was completed:  CT, CTA head and neck.   Care/Plan:  q2h NIHSS and VS  Bedside handoff with ED RN [place]  Thyra Yinger L Makilah Dowda  Rapid Response RN

## 2020-12-29 NOTE — ED Notes (Signed)
EDP notified on elevated troponin result.

## 2020-12-29 NOTE — ED Notes (Signed)
Assumed care on patient , denies pain , respirations unlabored , IV site intact , waiting call from MRI .

## 2020-12-29 NOTE — ED Provider Notes (Signed)
Emergency Department Provider Note   I have reviewed the triage vital signs and the nursing notes.   HISTORY  Chief Complaint Code Stroke   HPI Jacob Ruiz is a 75 y.o. male with past medical history reviewed below presents to the emergency department for evaluation of chest pain but upon EMS arrival was found to have some left-sided weakness.  Patient states that he was working on a ceiling fan for good part of the afternoon.  He was standing on a bed and reaching up to work on the appliance.  He finished the job and sat down to put her shoes back on it while bending over had an acute onset, sharp/stabbing pain in the left chest.  Symptoms are worse with movement or touching the area.  No pain into the abdomen or back.  No syncope.  He alerted family who called 911 and on their arrival he was found to have some left arm weakness.  He was and activated as a code stroke with last seen normal at 1730.  EMS question some slurred speech as well.  Patient states that when at rest he is not having pain in his chest since only with movement or touching.  No other modifying factors.   Family note that patient did have an admission to the hospital in February with some chest discomfort and passing out.  They state that he was ultimately discharged home with no clear cause for his pain.     Past Medical History:  Diagnosis Date  . Chronic bronchitis (HCC)    "get it just about q yr" (03/26/2014)  . H/O hiatal hernia     Patient Active Problem List   Diagnosis Date Noted  . Suspected TIA (transient ischemic attack) 12/29/2020  . NSTEMI (non-ST elevated myocardial infarction) (HCC) 12/29/2020  . Ischemic chest pain (HCC) 11/07/2020  . Chest pain 11/07/2020  . Primary hypertension   . Vertigo 03/26/2014  . Bradycardia 03/26/2014  . Tobacco abuse 03/26/2014  . ERECTILE DYSFUNCTION, PSYCHOGENIC 10/26/2009  . URGE INCONTINENCE 10/26/2009  . BENIGN PROSTATIC HYPERTROPHY, HX OF 10/26/2009   . Elevated blood pressure 10/20/2006    Past Surgical History:  Procedure Laterality Date  . KNEE ARTHROSCOPY Right 1970's  . TONSILLECTOMY  1950's    Allergies Lisinopril, Ace inhibitors, and Aleve [naproxen sodium]  Family History  Problem Relation Age of Onset  . Heart failure Mother   . Heart disease Father     Social History Social History   Tobacco Use  . Smoking status: Current Every Day Smoker    Packs/day: 2.00    Years: 54.00    Pack years: 108.00    Types: Cigarettes  . Smokeless tobacco: Never Used  Substance Use Topics  . Alcohol use: No  . Drug use: No    Review of Systems  Constitutional: No fever/chills Eyes: No visual changes. ENT: No sore throat. Cardiovascular: Positive chest pain. Respiratory: Denies shortness of breath. Gastrointestinal: No abdominal pain.  No nausea, no vomiting.  No diarrhea.  No constipation. Genitourinary: Negative for dysuria. Musculoskeletal: Negative for back pain. Skin: Negative for rash. Neurological: Negative for headaches, focal weakness or numbness.  10-point ROS otherwise negative.  ____________________________________________   PHYSICAL EXAM:  VITAL SIGNS: Vitals:   12/29/20 2330 12/29/20 2345  BP: (!) 166/108   Pulse: 62 (!) 59  Resp: 12 15  Temp:    SpO2: 96% 91%   Constitutional: Alert and oriented. Well appearing and in no acute distress.  Eyes: Conjunctivae are normal.  Head: Atraumatic. Nose: No congestion/rhinnorhea. Mouth/Throat: Mucous membranes are moist.  Neck: No stridor.   Cardiovascular: Normal rate, regular rhythm. Good peripheral circulation. Grossly normal heart sounds.  Tenderness to palpation under the left breast and chest wall reproducing pain.  Respiratory: Normal respiratory effort.  No retractions. Lungs CTAB. Gastrointestinal: Soft and nontender. No distention.  Musculoskeletal: No lower extremity tenderness nor edema. No Ruiz deformities of extremities. Neurologic:   Normal speech and language.  No pronator drift but cannot resist downward pressure by examiner in the left upper extremity complaining of chest pain. No facial asymmetry. Normal strength in the bilateral lower extremities.  Skin:  Skin is warm, dry and intact. No rash noted.  ____________________________________________   LABS (all labs ordered are listed, but only abnormal results are displayed)  Labs Reviewed  COMPREHENSIVE METABOLIC PANEL - Abnormal; Notable for the following components:      Result Value   Sodium 134 (*)    Glucose, Bld 157 (*)    All other components within normal limits  I-STAT CHEM 8, ED - Abnormal; Notable for the following components:   Glucose, Bld 155 (*)    Calcium, Ion 1.09 (*)    All other components within normal limits  CBG MONITORING, ED - Abnormal; Notable for the following components:   Glucose-Capillary 145 (*)    All other components within normal limits  TROPONIN I (HIGH SENSITIVITY) - Abnormal; Notable for the following components:   Troponin I (High Sensitivity) 124 (*)    All other components within normal limits  TROPONIN I (HIGH SENSITIVITY) - Abnormal; Notable for the following components:   Troponin I (High Sensitivity) 176 (*)    All other components within normal limits  RESP PANEL BY RT-PCR (FLU A&B, COVID) [id number]  PROTIME-INR  APTT  CBC  DIFFERENTIAL   ____________________________________________  EKG   EKG Interpretation  Date/Time:  Wednesday December 29 2020 18:52:01 EDT Ventricular Rate:  55 PR Interval:  177 QRS Duration: 123 QT Interval:  468 QTC Calculation: 448 R Axis:   -59 Text Interpretation: Sinus rhythm Nonspecific IVCD with LAD Probable anteroseptal infarct, old No significant change since last tracing Confirmed by Alona Bene [phone]) on 12/29/2020 7:04:29 PM       ____________________________________________  RADIOLOGY  DG Chest 2 View  Result Date: 12/29/2020 CLINICAL DATA:  75 year old male with  chest pain. EXAM: CHEST - 2 VIEW COMPARISON:  Chest radiograph dated 11/07/2020. FINDINGS: No focal consolidation, pleural effusion or pneumothorax. The cardiac silhouette is within limits. Atherosclerotic calcification of the aorta. Degenerative changes of the spine. No acute osseous pathology. IMPRESSION: No active cardiopulmonary disease. Electronically Signed   By: Elgie Collard M.D.   On: 12/29/2020 19:59   CT HEAD CODE STROKE WO CONTRAST  Result Date: 12/29/2020 CLINICAL DATA:  Code stroke.  Left-sided weakness. EXAM: CT HEAD WITHOUT CONTRAST TECHNIQUE: Contiguous axial images were obtained from the base of the skull through the vertex without intravenous contrast. COMPARISON:  Head MRI 03/26/2014 FINDINGS: Brain: There is no evidence of an acute infarct, intracranial hemorrhage, mass, midline shift, or extra-axial fluid collection. The ventricles and sulci are within normal limits for age. Hypodensities in the cerebral white matter bilaterally are nonspecific but compatible with mild chronic small vessel ischemic disease. Vascular: Calcified atherosclerosis at the skull base. No hyperdense vessel. Skull: No acute fracture or suspicious osseous lesion. Sinuses/Orbits: Depression of the left lamina papyracea which may reflect an old medial orbital fracture. No evidence of  acute inflammatory sinus disease. Clear mastoid air cells. Other: None. ASPECTS [facility] Stroke Program Early CT Score) - Ganglionic level infarction (caudate, lentiform nuclei, internal capsule, insula, M1-M3 cortex): 7 - Supraganglionic infarction (M4-M6 cortex): 3 Total score (0-10 with 10 being normal): 10 IMPRESSION: 1. No evidence of acute intracranial abnormality. 2. ASPECTS is 10. 3. Mild chronic small vessel ischemic disease. These results were communicated to Dr. Wilford Corner at 6:43 pm on 12/29/2020 by text page via the [facility] messaging system. Electronically Signed   By: Sebastian Ache M.D.   On: 12/29/2020 18:43   CT ANGIO HEAD CODE  STROKE  Result Date: 12/29/2020 CLINICAL DATA:  Left-sided weakness.  Slurred speech. EXAM: CT ANGIOGRAPHY HEAD AND NECK TECHNIQUE: Multidetector CT imaging of the head and neck was performed using the standard protocol during bolus administration of intravenous contrast. Multiplanar CT image reconstructions and MIPs were obtained to evaluate the vascular anatomy. Carotid stenosis measurements (when applicable) are obtained utilizing NASCET criteria, using the distal internal carotid diameter as the denominator. CONTRAST:  43mL OMNIPAQUE IOHEXOL 350 MG/ML SOLN COMPARISON:  None. FINDINGS: CTA NECK FINDINGS Aortic arch: Standard 3 vessel aortic arch with mild atherosclerotic plaque. No significant arch vessel origin stenosis. Right carotid system: Patent with mild calcified plaque at the carotid bifurcation and in the proximal ICA resulting in less than 50% ICA stenosis. Left carotid system: Patent with moderate calcified and soft plaque at the carotid bifurcation and in the proximal ICA resulting in 50% proximal ICA stenosis. Vertebral arteries: Patent with calcified plaque at both vertebral artery origins. No evidence of significant stenosis or dissection. Mildly dominant right vertebral artery. Skeleton: Widespread severe cervical facet arthrosis. Mild cervical disc degeneration. Other neck: Bilateral posterior cervical muscular atrophy. Upper chest: Mild centrilobular and paraseptal emphysema. Review of the MIP images confirms the above findings CTA HEAD FINDINGS Anterior circulation: The internal carotid arteries are patent from skull base to carotid termini with atherosclerotic plaque resulting in mild paraclinoid stenosis bilaterally. ACAs and MCAs are patent without evidence of a proximal branch occlusion or significant proximal stenosis. No aneurysm is identified. Posterior circulation: The intracranial vertebral arteries are patent to the basilar with mild atherosclerotic irregularity bilaterally but no  significant stenosis. Patent PICA and SCA origins are seen bilaterally. The basilar artery is widely patent. Posterior communicating arteries are diminutive or absent. Both PCAs are patent without evidence of a significant proximal stenosis. There is a severe right P3 branch vessel stenosis. No aneurysm is identified. Venous sinuses: Not well evaluated due to arterial contrast timing. Anatomic variants: None. Review of the MIP images confirms the above findings IMPRESSION: 1. No large vessel occlusion. 2. Intracranial and cervical atherosclerosis resulting in mild bilateral intracranial ICA stenoses and 60% proximal left ICA stenosis. 3.  Aortic Atherosclerosis (ICD10-I70.0). These results were communicated to Dr. Wilford Corner at 6:43 pm on 12/29/2020 by text page via the [facility] messaging system. Electronically Signed   By: Sebastian Ache M.D.   On: 12/29/2020 19:15   CT ANGIO NECK CODE STROKE  Result Date: 12/29/2020 CLINICAL DATA:  Left-sided weakness.  Slurred speech. EXAM: CT ANGIOGRAPHY HEAD AND NECK TECHNIQUE: Multidetector CT imaging of the head and neck was performed using the standard protocol during bolus administration of intravenous contrast. Multiplanar CT image reconstructions and MIPs were obtained to evaluate the vascular anatomy. Carotid stenosis measurements (when applicable) are obtained utilizing NASCET criteria, using the distal internal carotid diameter as the denominator. CONTRAST:  13mL OMNIPAQUE IOHEXOL 350 MG/ML SOLN COMPARISON:  None. FINDINGS:  CTA NECK FINDINGS Aortic arch: Standard 3 vessel aortic arch with mild atherosclerotic plaque. No significant arch vessel origin stenosis. Right carotid system: Patent with mild calcified plaque at the carotid bifurcation and in the proximal ICA resulting in less than 50% ICA stenosis. Left carotid system: Patent with moderate calcified and soft plaque at the carotid bifurcation and in the proximal ICA resulting in 50% proximal ICA stenosis. Vertebral  arteries: Patent with calcified plaque at both vertebral artery origins. No evidence of significant stenosis or dissection. Mildly dominant right vertebral artery. Skeleton: Widespread severe cervical facet arthrosis. Mild cervical disc degeneration. Other neck: Bilateral posterior cervical muscular atrophy. Upper chest: Mild centrilobular and paraseptal emphysema. Review of the MIP images confirms the above findings CTA HEAD FINDINGS Anterior circulation: The internal carotid arteries are patent from skull base to carotid termini with atherosclerotic plaque resulting in mild paraclinoid stenosis bilaterally. ACAs and MCAs are patent without evidence of a proximal branch occlusion or significant proximal stenosis. No aneurysm is identified. Posterior circulation: The intracranial vertebral arteries are patent to the basilar with mild atherosclerotic irregularity bilaterally but no significant stenosis. Patent PICA and SCA origins are seen bilaterally. The basilar artery is widely patent. Posterior communicating arteries are diminutive or absent. Both PCAs are patent without evidence of a significant proximal stenosis. There is a severe right P3 branch vessel stenosis. No aneurysm is identified. Venous sinuses: Not well evaluated due to arterial contrast timing. Anatomic variants: None. Review of the MIP images confirms the above findings IMPRESSION: 1. No large vessel occlusion. 2. Intracranial and cervical atherosclerosis resulting in mild bilateral intracranial ICA stenoses and 60% proximal left ICA stenosis. 3.  Aortic Atherosclerosis (ICD10-I70.0). These results were communicated to Dr. Wilford Corner at 6:43 pm on 12/29/2020 by text page via the [facility] messaging system. Electronically Signed   By: Sebastian Ache M.D.   On: 12/29/2020 19:15    ____________________________________________   PROCEDURES  Procedure(s) performed:   .Critical Care Performed by: Maia Plan, MD Authorized by: Maia Plan, MD    Critical care provider statement:    Critical care time (minutes):  45   Critical care time was exclusive of:  Separately billable procedures and treating other patients and teaching time   Critical care was necessary to treat or prevent imminent or life-threatening deterioration of the following conditions:  CNS failure or compromise   Critical care was time spent personally by me on the following activities:  Discussions with consultants, evaluation of patient's response to treatment, examination of patient, ordering and performing treatments and interventions, ordering and review of laboratory studies, ordering and review of radiographic studies, pulse oximetry, re-evaluation of patient's condition, obtaining history from patient or surrogate, review of old charts, development of treatment plan with patient or surrogate and blood draw for specimens   I assumed direction of critical care for this patient from another provider in my specialty: no     Care discussed with: admitting provider       ____________________________________________   INITIAL IMPRESSION / ASSESSMENT AND PLAN / ED COURSE  Pertinent labs & imaging results that were available during my care of the patient were reviewed by me and considered in my medical decision making (see chart for details).   Patient presents to the emergency department with chest discomfort and some left-sided weakness.  He was evaluated as a code stroke on arrival but after evaluation the neurologist felt that this was likely weakness related to pain in the patient's chest making it  painful for him to raise his arm.  No other neurologic deficits.  NIH stroke scale of 0.  MRI brain is planned but if that does not show stroke there would be no other neurology treatment/evaluation necessary.  In review of the patient's admission from February he had a CT a of the chest looking for PE which was negative.  He actually had a CT coronary showing him at high  risk with elevated calcium score.  As part of the code stroke work-up patient got angios of the head and neck upon arrival.  I cannot immediately repeat an angio of his chest.  My suspicion for aortic dissection is exceedingly low.  Patient has equal pulses and normal labs here.  PE is a consideration but patient's chest pain seems very reproducible on my exam and worse with movement.  His initial troponin, however, is slightly elevated at 124.  Will discuss with cardiology.  Discussed initial elevated troponin with Dr. Deforest Hoyles.  Plan for troponin trending and reassess.  On repeat labs the troponin is elevated to 176.  Discussed again with cardiology who recommends holding heparin with atypical type pain and they will consult to decide on further with stratification.  Again, my suspicion for dissection is very low given the patient's normal vitals and equal pulses.  Considered PE but with angio studies already done today will need to hold on additional contrast for now. Plan for [facility] admit.   Discussed patient's case with [facility] to request admission. Patient and family (if present) updated with plan. Care transferred to [facility] service.  I reviewed all nursing notes, vitals, pertinent old records, EKGs, labs, imaging (as available).    ____________________________________________  FINAL CLINICAL IMPRESSION(S) / ED DIAGNOSES  Final diagnoses:  Atypical chest pain  Left-sided weakness     MEDICATIONS GIVEN DURING THIS VISIT:  Medications  sodium chloride flush (NS) 0.9 % injection 3 mL (3 mLs Intravenous Not Given 12/29/20 1911)  iohexol (OMNIPAQUE) 350 MG/ML injection 75 mL (75 mLs Intravenous Contrast Given 12/29/20 1839)    Note:  This document was prepared using Dragon voice recognition software and may include unintentional dictation errors.  Alona Bene, MD, [facility] Emergency [facility]    Pernell Lenoir, Arlyss Repress, MD 12/29/20 2352

## 2020-12-29 NOTE — ED Notes (Signed)
Patient transported to X-ray 

## 2020-12-29 NOTE — ED Notes (Signed)
Patient returned from MRI.

## 2020-12-29 NOTE — H&P (Signed)
History and Physical   Jacob Klippelerry J Jetter QMV:[id number] DOB: 07/20/1946 DOA: 12/29/2020  Referring MD/NP/PA: Dr. Jacqulyn BathLong  PCP: Sigmund HazelMiller, Lisa, MD   Outpatient Specialists: Dr. Weston BrassAcharya, Gayatri  Patient coming from: Home  Chief Complaint: Code stroke chest pain  HPI: Jacob Ruiz is a 75 y.o. male with medical history significant of essential hypertension, GERD, tobacco abuse, osteoarthritis who was brought in by EMS secondary to complaint of sudden left-sided chest pain and diaphoresis.  When EMS arrived patient was exhibiting left-sided weakness involving the left upper extremity as well as slurred speech.  He was brought in as a code stroke.  Patient seen and evaluated.  Initial CT showed no acute CVA.  His EKG shows no significant changes but patient has elevated troponin at this point.  Worrisome for acute coronary syndrome.  Neurology consulted and work-up is being done.  MRI currently pending.  Cardiology also consulted.  Recommends holding off on heparin for.  Cardiology to see patient and will follow recommendations.  Is chest pain-free although he has intermittent pain.  No prior history of coronary artery disease but has significant risk factors for both CVA and coronary artery disease including hypertension and tobacco abuse..  ED Course: Temperature is 97.9 blood pressure 142/74, pulse 57 respirate of 23 oxygen sat 92% room air.  CBC largely within normal.  Sodium 134.  Glucose 157.  Initial troponin I 24-second troponin was 74.  CT head and CT angio code stroke essentially negative.  MRI of the brain is currently pending.  Patient being admitted for further evaluation and treatment.  Review of Systems: As per HPI otherwise 10 point review of systems negative.    Past Medical History:  Diagnosis Date  . Chronic bronchitis (HCC)    "get it just about q yr" (03/26/2014)  . H/O hiatal hernia     Past Surgical History:  Procedure Laterality Date  . KNEE ARTHROSCOPY Right 1970's  .  TONSILLECTOMY  1950's     reports that he has been smoking cigarettes. He has a 108.00 pack-year smoking history. He has never used smokeless tobacco. He reports that he does not drink alcohol and does not use drugs.  Allergies  Allergen Reactions  . Lisinopril Anaphylaxis  . Ace Inhibitors     unknown  . Aleve [Naproxen Sodium] Rash    Family History  Problem Relation Age of Onset  . Heart failure Mother   . Heart disease Father      Prior to Admission medications   Medication Sig Start Date End Date Taking? Authorizing Provider  acetaminophen (TYLENOL) 500 MG tablet Take 1,000 mg by mouth every 6 (six) hours as needed for mild pain.    [provider]  albuterol (VENTOLIN HFA) 108 (90 Base) MCG/ACT inhaler Inhale 1 puff into the lungs every 4 (four) hours as needed for wheezing or shortness of breath. 09/30/20   [provider]  amLODipine (NORVASC) 10 MG tablet Take 1 tablet (10 mg total) by mouth daily. 12/06/20   Parke PoissonAcharya, Gayatri A, MD  atorvastatin (LIPITOR) 20 MG tablet Take 1 tablet (20 mg total) by mouth daily. 11/10/20   Joseph ArtVann, Jessica U, DO  isosorbide mononitrate (IMDUR) 30 MG 24 hr tablet Take 1 tablet (30 mg total) by mouth daily. 11/09/20   Joseph ArtVann, Jessica U, DO  metoprolol succinate (TOPROL-XL) 25 MG 24 hr tablet Take 1 tablet (25 mg total) by mouth daily. 11/09/20   Joseph ArtVann, Jessica U, DO  nicotine (NICODERM CQ - DOSED IN  MG/24 HOURS) 21 mg/24hr patch Place 1 patch (21 mg total) onto the skin daily. 11/10/20   Joseph Art, DO  nitroGLYCERIN (NITROSTAT) 0.4 MG SL tablet Place 1 tablet (0.4 mg total) under the tongue every 5 (five) minutes as needed for chest pain. YOU MAY TAKE (1) TABLET EVERY 5 MINS AS NEEDED FOR CHEST PAIN. DO NOT EXCEED 3 DOSES. 12/06/20 03/06/21  Parke Poisson, MD    Physical Exam: Vitals:   12/29/20 1900 12/29/20 1915 12/29/20 2000 12/29/20 2100  BP: 126/74 127/80 126/77 (!) 142/74  Pulse: (!) 57 (!) 56 (!) 55 (!) 55  Resp: (!) 21  20 20  (!) 23  Temp:      TempSrc:      SpO2: 96% 94% 93% 92%  Weight:      Height:          Constitutional: Acutely ill looking, no major distress Vitals:   12/29/20 1900 12/29/20 1915 12/29/20 2000 12/29/20 2100  BP: 126/74 127/80 126/77 (!) 142/74  Pulse: (!) 57 (!) 56 (!) 55 (!) 55  Resp: (!) 21 20 20  (!) 23  Temp:      TempSrc:      SpO2: 96% 94% 93% 92%  Weight:      Height:       Eyes: PERRL, lids and conjunctivae normal ENMT: Mucous membranes are moist. Posterior pharynx clear of any exudate or lesions.Normal dentition.  Neck: normal, supple, no masses, no thyromegaly Respiratory: clear to auscultation bilaterally, no wheezing, no crackles. Normal respiratory effort. No accessory muscle use.  Cardiovascular: Sinus bradycardia, no murmurs / rubs / gallops. No extremity edema. 2+ pedal pulses. No carotid bruits.  Abdomen: no tenderness, no masses palpated. No hepatosplenomegaly. Bowel sounds positive.  Musculoskeletal: no clubbing / cyanosis. No joint deformity upper and lower extremities. Good ROM, no contractures. Normal muscle tone.  Reproducible chest pain over the left chest wall Skin: no rashes, lesions, ulcers. No induration Neurologic: CN 2-12 grossly intact. Sensation intact, DTR normal. Strength 5/5 in all 4.  Psychiatric: Normal judgment and insight. Alert and oriented x 3. Normal mood.     Labs on Admission: I have personally reviewed following labs and imaging studies  CBC: Recent Labs  Lab 12/29/20 1825  WBC 8.5  NEUTROABS 5.2  HGB 14.1  14.6  HCT 43.1  43.0  MCV 91.7  PLT 201   Basic Metabolic Panel: Recent Labs  Lab 12/29/20 1825  NA 134*  135  K 3.7  3.7  CL 100  99  CO2 26  GLUCOSE 157*  155*  BUN 17  20  CREATININE 0.97  0.90  CALCIUM 9.1   GFR: Estimated Creatinine Clearance: 75 mL/min (by C-G formula based on SCr of 0.97 mg/dL). Liver Function Tests: Recent Labs  Lab 12/29/20 1825  AST 20  ALT 18  ALKPHOS 85   BILITOT 0.8  PROT 7.3  ALBUMIN 4.2   No results for input(s): LIPASE, AMYLASE in the last 168 hours. No results for input(s): AMMONIA in the last 168 hours. Coagulation Profile: Recent Labs  Lab 12/29/20 1825  INR 1.0   Cardiac Enzymes: No results for input(s): CKTOTAL, CKMB, CKMBINDEX, TROPONINI in the last 168 hours. BNP (last 3 results) No results for input(s): PROBNP in the last 8760 hours. HbA1C: No results for input(s): HGBA1C in the last 72 hours. CBG: Recent Labs  Lab 12/29/20 1818  GLUCAP 145*   Lipid Profile: No results for input(s): CHOL, HDL, LDLCALC, TRIG, CHOLHDL, LDLDIRECT  in the last 72 hours. Thyroid Function Tests: No results for input(s): TSH, T4TOTAL, FREET4, T3FREE, THYROIDAB in the last 72 hours. Anemia Panel: No results for input(s): VITAMINB12, FOLATE, FERRITIN, TIBC, IRON, RETICCTPCT in the last 72 hours. Urine analysis:    Component Value Date/Time   COLORURINE YELLOW 03/26/2014 1305   APPEARANCEUR CLEAR 03/26/2014 1305   LABSPEC 1.023 03/26/2014 1305   PHURINE 8.0 03/26/2014 1305   GLUCOSEU NEGATIVE 03/26/2014 1305   HGBUR NEGATIVE 03/26/2014 1305   HGBUR negative 10/26/2009 1451   BILIRUBINUR NEGATIVE 03/26/2014 1305   KETONESUR NEGATIVE 03/26/2014 1305   PROTEINUR NEGATIVE 03/26/2014 1305   UROBILINOGEN 0.2 03/26/2014 1305   NITRITE NEGATIVE 03/26/2014 1305   LEUKOCYTESUR NEGATIVE 03/26/2014 1305   Sepsis Labs: @LABRCNTIP (procalcitonin:4,lacticidven:4) ) Recent Results (from the past 240 hour(s))  Resp Panel by RT-PCR (Flu A&B, Covid) Nasopharyngeal Swab     Status: None   Collection Time: 12/29/20  7:51 PM   Specimen: Nasopharyngeal Swab; Nasopharyngeal(NP) swabs in vial transport medium  Result Value Ref Range Status   SARS Coronavirus 2 by RT PCR NEGATIVE NEGATIVE Final    Comment: (NOTE) SARS-CoV-2 target nucleic acids are NOT DETECTED.  The SARS-CoV-2 RNA is generally detectable in upper respiratory specimens during the  acute phase of infection. The lowest concentration of SARS-CoV-2 viral copies this assay can detect is 138 copies/mL. A negative result does not preclude SARS-Cov-2 infection and should not be used as the sole basis for treatment or other patient management decisions. A negative result may occur with  improper specimen collection/handling, submission of specimen other than nasopharyngeal swab, presence of viral mutation(s) within the areas targeted by this assay, and inadequate number of viral copies(<138 copies/mL). A negative result must be combined with clinical observations, patient history, and epidemiological information. The expected result is Negative.  Fact Sheet for Patients:  02/28/21  Fact Sheet for Healthcare Providers:  BloggerCourse.com  This test is no t yet approved or cleared by the [employer] FDA and  has been authorized for detection and/or diagnosis of SARS-CoV-2 by FDA under an Emergency Use Authorization (EUA). This EUA will remain  in effect (meaning this test can be used) for the duration of the COVID-19 declaration under Section 564(b)(1) of the Act, 21 U.S.C.section 360bbb-3(b)(1), unless the authorization is terminated  or revoked sooner.       Influenza A by PCR NEGATIVE NEGATIVE Final   Influenza B by PCR NEGATIVE NEGATIVE Final    Comment: (NOTE) The Xpert Xpress SARS-CoV-2/FLU/RSV plus assay is intended as an aid in the diagnosis of influenza from Nasopharyngeal swab specimens and should not be used as a sole basis for treatment. Nasal washings and aspirates are unacceptable for Xpert Xpress SARS-CoV-2/FLU/RSV testing.  Fact Sheet for Patients: [place]  Fact Sheet for Healthcare Providers: BloggerCourse.com  This test is not yet approved or cleared by the [employer] FDA and has been authorized for detection and/or  diagnosis of SARS-CoV-2 by FDA under an Emergency Use Authorization (EUA). This EUA will remain in effect (meaning this test can be used) for the duration of the COVID-19 declaration under Section 564(b)(1) of the Act, 21 U.S.C. section 360bbb-3(b)(1), unless the authorization is terminated or revoked.  Performed at [facility], [address] [place], [place]      Radiological Exams on Admission: DG Chest 2 View  Result Date: 12/29/2020 CLINICAL DATA:  75 year old male with chest pain. EXAM: CHEST - 2 VIEW COMPARISON:  Chest radiograph dated 11/07/2020.  FINDINGS: No focal consolidation, pleural effusion or pneumothorax. The cardiac silhouette is within limits. Atherosclerotic calcification of the aorta. Degenerative changes of the spine. No acute osseous pathology. IMPRESSION: No active cardiopulmonary disease. Electronically Signed   By: Elgie Collard M.D.   On: 12/29/2020 19:59   CT HEAD CODE STROKE WO CONTRAST  Result Date: 12/29/2020 CLINICAL DATA:  Code stroke.  Left-sided weakness. EXAM: CT HEAD WITHOUT CONTRAST TECHNIQUE: Contiguous axial images were obtained from the base of the skull through the vertex without intravenous contrast. COMPARISON:  Head MRI 03/26/2014 FINDINGS: Brain: There is no evidence of an acute infarct, intracranial hemorrhage, mass, midline shift, or extra-axial fluid collection. The ventricles and sulci are within normal limits for age. Hypodensities in the cerebral white matter bilaterally are nonspecific but compatible with mild chronic small vessel ischemic disease. Vascular: Calcified atherosclerosis at the skull base. No hyperdense vessel. Skull: No acute fracture or suspicious osseous lesion. Sinuses/Orbits: Depression of the left lamina papyracea which may reflect an old medial orbital fracture. No evidence of acute inflammatory sinus disease. Clear mastoid air cells. Other: None. ASPECTS [facility] Stroke Program Early CT Score) - Ganglionic  level infarction (caudate, lentiform nuclei, internal capsule, insula, M1-M3 cortex): 7 - Supraganglionic infarction (M4-M6 cortex): 3 Total score (0-10 with 10 being normal): 10 IMPRESSION: 1. No evidence of acute intracranial abnormality. 2. ASPECTS is 10. 3. Mild chronic small vessel ischemic disease. These results were communicated to Dr. Wilford Corner at 6:43 pm on 12/29/2020 by text page via the [facility] messaging system. Electronically Signed   By: Sebastian Ache M.D.   On: 12/29/2020 18:43   CT ANGIO HEAD CODE STROKE  Result Date: 12/29/2020 CLINICAL DATA:  Left-sided weakness.  Slurred speech. EXAM: CT ANGIOGRAPHY HEAD AND NECK TECHNIQUE: Multidetector CT imaging of the head and neck was performed using the standard protocol during bolus administration of intravenous contrast. Multiplanar CT image reconstructions and MIPs were obtained to evaluate the vascular anatomy. Carotid stenosis measurements (when applicable) are obtained utilizing NASCET criteria, using the distal internal carotid diameter as the denominator. CONTRAST:  71mL OMNIPAQUE IOHEXOL 350 MG/ML SOLN COMPARISON:  None. FINDINGS: CTA NECK FINDINGS Aortic arch: Standard 3 vessel aortic arch with mild atherosclerotic plaque. No significant arch vessel origin stenosis. Right carotid system: Patent with mild calcified plaque at the carotid bifurcation and in the proximal ICA resulting in less than 50% ICA stenosis. Left carotid system: Patent with moderate calcified and soft plaque at the carotid bifurcation and in the proximal ICA resulting in 50% proximal ICA stenosis. Vertebral arteries: Patent with calcified plaque at both vertebral artery origins. No evidence of significant stenosis or dissection. Mildly dominant right vertebral artery. Skeleton: Widespread severe cervical facet arthrosis. Mild cervical disc degeneration. Other neck: Bilateral posterior cervical muscular atrophy. Upper chest: Mild centrilobular and paraseptal emphysema. Review of the MIP  images confirms the above findings CTA HEAD FINDINGS Anterior circulation: The internal carotid arteries are patent from skull base to carotid termini with atherosclerotic plaque resulting in mild paraclinoid stenosis bilaterally. ACAs and MCAs are patent without evidence of a proximal branch occlusion or significant proximal stenosis. No aneurysm is identified. Posterior circulation: The intracranial vertebral arteries are patent to the basilar with mild atherosclerotic irregularity bilaterally but no significant stenosis. Patent PICA and SCA origins are seen bilaterally. The basilar artery is widely patent. Posterior communicating arteries are diminutive or absent. Both PCAs are patent without evidence of a significant proximal stenosis. There is a severe right P3 branch vessel stenosis. No  aneurysm is identified. Venous sinuses: Not well evaluated due to arterial contrast timing. Anatomic variants: None. Review of the MIP images confirms the above findings IMPRESSION: 1. No large vessel occlusion. 2. Intracranial and cervical atherosclerosis resulting in mild bilateral intracranial ICA stenoses and 60% proximal left ICA stenosis. 3.  Aortic Atherosclerosis (ICD10-I70.0). These results were communicated to Dr. Wilford Corner at 6:43 pm on 12/29/2020 by text page via the [facility] messaging system. Electronically Signed   By: Sebastian Ache M.D.   On: 12/29/2020 19:15   CT ANGIO NECK CODE STROKE  Result Date: 12/29/2020 CLINICAL DATA:  Left-sided weakness.  Slurred speech. EXAM: CT ANGIOGRAPHY HEAD AND NECK TECHNIQUE: Multidetector CT imaging of the head and neck was performed using the standard protocol during bolus administration of intravenous contrast. Multiplanar CT image reconstructions and MIPs were obtained to evaluate the vascular anatomy. Carotid stenosis measurements (when applicable) are obtained utilizing NASCET criteria, using the distal internal carotid diameter as the denominator. CONTRAST:  46mL OMNIPAQUE  IOHEXOL 350 MG/ML SOLN COMPARISON:  None. FINDINGS: CTA NECK FINDINGS Aortic arch: Standard 3 vessel aortic arch with mild atherosclerotic plaque. No significant arch vessel origin stenosis. Right carotid system: Patent with mild calcified plaque at the carotid bifurcation and in the proximal ICA resulting in less than 50% ICA stenosis. Left carotid system: Patent with moderate calcified and soft plaque at the carotid bifurcation and in the proximal ICA resulting in 50% proximal ICA stenosis. Vertebral arteries: Patent with calcified plaque at both vertebral artery origins. No evidence of significant stenosis or dissection. Mildly dominant right vertebral artery. Skeleton: Widespread severe cervical facet arthrosis. Mild cervical disc degeneration. Other neck: Bilateral posterior cervical muscular atrophy. Upper chest: Mild centrilobular and paraseptal emphysema. Review of the MIP images confirms the above findings CTA HEAD FINDINGS Anterior circulation: The internal carotid arteries are patent from skull base to carotid termini with atherosclerotic plaque resulting in mild paraclinoid stenosis bilaterally. ACAs and MCAs are patent without evidence of a proximal branch occlusion or significant proximal stenosis. No aneurysm is identified. Posterior circulation: The intracranial vertebral arteries are patent to the basilar with mild atherosclerotic irregularity bilaterally but no significant stenosis. Patent PICA and SCA origins are seen bilaterally. The basilar artery is widely patent. Posterior communicating arteries are diminutive or absent. Both PCAs are patent without evidence of a significant proximal stenosis. There is a severe right P3 branch vessel stenosis. No aneurysm is identified. Venous sinuses: Not well evaluated due to arterial contrast timing. Anatomic variants: None. Review of the MIP images confirms the above findings IMPRESSION: 1. No large vessel occlusion. 2. Intracranial and cervical  atherosclerosis resulting in mild bilateral intracranial ICA stenoses and 60% proximal left ICA stenosis. 3.  Aortic Atherosclerosis (ICD10-I70.0). These results were communicated to Dr. Wilford Corner at 6:43 pm on 12/29/2020 by text page via the [facility] messaging system. Electronically Signed   By: Sebastian Ache M.D.   On: 12/29/2020 19:15    EKG: Independently reviewed.   Assessment/Plan Principal Problem:   NSTEMI (non-ST elevated myocardial infarction) (HCC) Active Problems:   Tobacco abuse   Primary hypertension   Suspected TIA (transient ischemic attack)     #1 code stroke: Seen by neurology.  MRI currently pending.  No stroke on CT.  Will follow results.  Continue statin with aspirin.  #2 NSTEMI: Seems to be the driving factor for patient's symptoms.  Cardiology consulted.  Will follow recommendation.  Patient has recent negative work-up about a month ago.  #3 COPD: No exacerbation.  Continue monitoring and treatment.  #4 tobacco abuse: Offer nicotine patch and counseling  #5 essential hypertension: Confirm and resume home regimen   DVT prophylaxis: Lovenox Code Status: Full code Family Communication: No family at bedside Disposition Plan: Home Consults called: Dr. Jerrell Belfast, neurology, cardiology Dr. Gilmore Laroche Admission status: Observation  Severity of Illness: The appropriate patient status for this patient is OBSERVATION. Observation status is judged to be reasonable and necessary in order to provide the required intensity of service to ensure the patient's safety. The patient's presenting symptoms, physical exam findings, and initial radiographic and laboratory data in the context of their medical condition is felt to place them at decreased risk for further clinical deterioration. Furthermore, it is anticipated that the patient will be medically stable for discharge from the hospital within 2 midnights of admission. The following factors support the patient status of observation.    " The patient's presenting symptoms include chest pain. " The physical exam findings include mild left upper extremity weakness. " The initial radiographic and laboratory data are elevated troponin.     Lonia Blood MD [facility] Pager [phone]  If 7PM-7AM, please contact night-coverage [email or web address] Password [facility]  12/29/2020, 11:16 PM

## 2020-12-29 NOTE — ED Triage Notes (Signed)
Pt bib ems from home with sudden onset of chest pain and diaphoresis. Pt also with L sided weakness and slurred speech. LKW 1730. VSS with ems. 12 lead LBBB.

## 2020-12-30 ENCOUNTER — Encounter (HOSPITAL_COMMUNITY): Payer: Self-pay | Admitting: Internal Medicine

## 2020-12-30 ENCOUNTER — Other Ambulatory Visit: Payer: Self-pay

## 2020-12-30 DIAGNOSIS — R0789 Other chest pain: Secondary | ICD-10-CM | POA: Diagnosis present

## 2020-12-30 DIAGNOSIS — Z888 Allergy status to other drugs, medicaments and biological substances status: Secondary | ICD-10-CM | POA: Diagnosis not present

## 2020-12-30 DIAGNOSIS — M1990 Unspecified osteoarthritis, unspecified site: Secondary | ICD-10-CM | POA: Diagnosis present

## 2020-12-30 DIAGNOSIS — I214 Non-ST elevation (NSTEMI) myocardial infarction: Secondary | ICD-10-CM | POA: Diagnosis present

## 2020-12-30 DIAGNOSIS — I2510 Atherosclerotic heart disease of native coronary artery without angina pectoris: Secondary | ICD-10-CM | POA: Diagnosis present

## 2020-12-30 DIAGNOSIS — J449 Chronic obstructive pulmonary disease, unspecified: Secondary | ICD-10-CM | POA: Diagnosis present

## 2020-12-30 DIAGNOSIS — I2584 Coronary atherosclerosis due to calcified coronary lesion: Secondary | ICD-10-CM | POA: Diagnosis present

## 2020-12-30 DIAGNOSIS — F17210 Nicotine dependence, cigarettes, uncomplicated: Secondary | ICD-10-CM | POA: Diagnosis present

## 2020-12-30 DIAGNOSIS — R4781 Slurred speech: Secondary | ICD-10-CM | POA: Diagnosis present

## 2020-12-30 DIAGNOSIS — Z79899 Other long term (current) drug therapy: Secondary | ICD-10-CM | POA: Diagnosis not present

## 2020-12-30 DIAGNOSIS — R7303 Prediabetes: Secondary | ICD-10-CM | POA: Diagnosis present

## 2020-12-30 DIAGNOSIS — E663 Overweight: Secondary | ICD-10-CM | POA: Diagnosis present

## 2020-12-30 DIAGNOSIS — I248 Other forms of acute ischemic heart disease: Secondary | ICD-10-CM | POA: Diagnosis present

## 2020-12-30 DIAGNOSIS — R531 Weakness: Secondary | ICD-10-CM | POA: Diagnosis present

## 2020-12-30 DIAGNOSIS — Z6829 Body mass index (BMI) 29.0-29.9, adult: Secondary | ICD-10-CM | POA: Diagnosis not present

## 2020-12-30 DIAGNOSIS — K219 Gastro-esophageal reflux disease without esophagitis: Secondary | ICD-10-CM | POA: Diagnosis present

## 2020-12-30 DIAGNOSIS — Z8249 Family history of ischemic heart disease and other diseases of the circulatory system: Secondary | ICD-10-CM | POA: Diagnosis not present

## 2020-12-30 DIAGNOSIS — Z20822 Contact with and (suspected) exposure to covid-19: Secondary | ICD-10-CM | POA: Diagnosis present

## 2020-12-30 DIAGNOSIS — I252 Old myocardial infarction: Secondary | ICD-10-CM | POA: Diagnosis not present

## 2020-12-30 DIAGNOSIS — I10 Essential (primary) hypertension: Secondary | ICD-10-CM | POA: Diagnosis present

## 2020-12-30 LAB — LIPID PANEL
Cholesterol: 140 mg/dL (ref 0–200)
HDL: 59 mg/dL (ref >40)
LDL Cholesterol: 73 mg/dL (ref 0–99)
Total CHOL/HDL Ratio: 2.4 RATIO
Triglycerides: 39 mg/dL (ref <150)
VLDL: 8 mg/dL (ref 0–40)

## 2020-12-30 LAB — HEMOGLOBIN A1C
Hgb A1c MFr Bld: 6.1 % — ABNORMAL HIGH (ref 4.8–5.6)
Mean Plasma Glucose: 128.37 mg/dL

## 2020-12-30 LAB — CBC
HCT: 40.3 % (ref 39.0–52.0)
Hemoglobin: 13.5 g/dL (ref 13.0–17.0)
MCH: 30.5 pg (ref 26.0–34.0)
MCHC: 33.5 g/dL (ref 30.0–36.0)
MCV: 91.2 fL (ref 80.0–100.0)
Platelets: 182 10*3/uL (ref 150–400)
RBC: 4.42 MIL/uL (ref 4.22–5.81)
RDW: 12.9 % (ref 11.5–15.5)
WBC: 8.6 10*3/uL (ref 4.0–10.5)
nRBC: 0 % (ref 0.0–0.2)

## 2020-12-30 LAB — HEPARIN LEVEL (UNFRACTIONATED): Heparin Unfractionated: 0.13 IU/mL — ABNORMAL LOW (ref 0.30–0.70)

## 2020-12-30 LAB — CREATININE, SERUM
Creatinine, Ser: 0.66 mg/dL (ref 0.61–1.24)
GFR, Estimated: >60 mL/min (ref >60)

## 2020-12-30 LAB — TROPONIN I (HIGH SENSITIVITY)
Troponin I (High Sensitivity): 131 ng/L (ref <18)
Troponin I (High Sensitivity): 135 ng/L (ref <18)

## 2020-12-30 LAB — TSH: TSH: 0.938 u[IU]/mL (ref 0.350–4.500)

## 2020-12-30 MED ORDER — SODIUM CHLORIDE 0.9 % WEIGHT BASED INFUSION
3.0000 mL/kg/h | INTRAVENOUS | Status: DC
  Administered 2020-12-31: 3 mL/kg/h via INTRAVENOUS

## 2020-12-30 MED ORDER — SODIUM CHLORIDE 0.9% FLUSH
3.0000 mL | Freq: Two times a day (BID) | INTRAVENOUS | Status: DC
  Administered 2020-12-30 – 2021-01-01 (×4): 3 mL via INTRAVENOUS

## 2020-12-30 MED ORDER — SODIUM CHLORIDE 0.45 % IV SOLN: INTRAVENOUS | Status: DC

## 2020-12-30 MED ORDER — HEPARIN BOLUS VIA INFUSION
4000.0000 [IU] | Freq: Once | INTRAVENOUS | Status: AC
  Administered 2020-12-30: 4000 [IU] via INTRAVENOUS
  Filled 2020-12-30: qty 4000

## 2020-12-30 MED ORDER — ASPIRIN 81 MG PO CHEW
81.0000 mg | CHEWABLE_TABLET | Freq: Once | ORAL | Status: AC
  Administered 2020-12-30: 81 mg via ORAL
  Filled 2020-12-30: qty 1

## 2020-12-30 MED ORDER — AMLODIPINE BESYLATE 10 MG PO TABS
10.0000 mg | ORAL_TABLET | Freq: Every day | ORAL | Status: DC
  Administered 2020-12-30 – 2021-01-01 (×3): 10 mg via ORAL
  Filled 2020-12-30 (×2): qty 1
  Filled 2020-12-30: qty 2

## 2020-12-30 MED ORDER — NITROGLYCERIN 0.4 MG SL SUBL: 0.4000 mg | SUBLINGUAL_TABLET | SUBLINGUAL | Status: DC | PRN

## 2020-12-30 MED ORDER — METOPROLOL SUCCINATE ER 25 MG PO TB24
25.0000 mg | ORAL_TABLET | Freq: Every day | ORAL | Status: DC
  Administered 2020-12-30 – 2021-01-01 (×3): 25 mg via ORAL
  Filled 2020-12-30 (×3): qty 1

## 2020-12-30 MED ORDER — ONDANSETRON HCL 4 MG/2ML IJ SOLN: 4.0000 mg | Freq: Four times a day (QID) | INTRAMUSCULAR | Status: DC | PRN

## 2020-12-30 MED ORDER — ISOSORBIDE MONONITRATE ER 30 MG PO TB24
30.0000 mg | ORAL_TABLET | Freq: Every day | ORAL | Status: DC
  Administered 2020-12-30 – 2021-01-01 (×3): 30 mg via ORAL
  Filled 2020-12-30 (×3): qty 1

## 2020-12-30 MED ORDER — SODIUM CHLORIDE 0.9% FLUSH: 3.0000 mL | INTRAVENOUS | Status: DC | PRN

## 2020-12-30 MED ORDER — ENOXAPARIN SODIUM 40 MG/0.4ML ~~LOC~~ SOLN: 40.0000 mg | Freq: Every day | SUBCUTANEOUS | Status: DC

## 2020-12-30 MED ORDER — ATORVASTATIN CALCIUM 40 MG PO TABS
40.0000 mg | ORAL_TABLET | Freq: Every day | ORAL | Status: DC
  Administered 2020-12-30 – 2021-01-01 (×3): 40 mg via ORAL
  Filled 2020-12-30 (×3): qty 1

## 2020-12-30 MED ORDER — SODIUM CHLORIDE 0.9 % WEIGHT BASED INFUSION: 1.0000 mL/kg/h | INTRAVENOUS | Status: DC

## 2020-12-30 MED ORDER — ASPIRIN 81 MG PO CHEW
81.0000 mg | CHEWABLE_TABLET | ORAL | Status: AC
  Administered 2020-12-31: 81 mg via ORAL
  Filled 2020-12-30: qty 1

## 2020-12-30 MED ORDER — HEPARIN BOLUS VIA INFUSION
2000.0000 [IU] | Freq: Once | INTRAVENOUS | Status: AC
  Administered 2020-12-30: 2000 [IU] via INTRAVENOUS
  Filled 2020-12-30: qty 2000

## 2020-12-30 MED ORDER — HEPARIN (PORCINE) 25000 UT/250ML-% IV SOLN
1450.0000 [IU]/h | INTRAVENOUS | Status: DC
  Administered 2020-12-30: 1200 [IU]/h via INTRAVENOUS
  Filled 2020-12-30 (×2): qty 250

## 2020-12-30 MED ORDER — ATORVASTATIN CALCIUM 10 MG PO TABS: 20.0000 mg | ORAL_TABLET | Freq: Every day | ORAL | Status: DC

## 2020-12-30 MED ORDER — SODIUM CHLORIDE 0.9 % IV SOLN: 250.0000 mL | INTRAVENOUS | Status: DC | PRN

## 2020-12-30 MED ORDER — NICOTINE 21 MG/24HR TD PT24
21.0000 mg | MEDICATED_PATCH | Freq: Every day | TRANSDERMAL | Status: DC
  Administered 2020-12-30 – 2021-01-01 (×3): 21 mg via TRANSDERMAL
  Filled 2020-12-30 (×3): qty 1

## 2020-12-30 MED ORDER — ACETAMINOPHEN 325 MG PO TABS: 650.0000 mg | ORAL_TABLET | ORAL | Status: DC | PRN

## 2020-12-30 NOTE — Progress Notes (Signed)
PROGRESS NOTE    Jacob Ruiz  ZOX:[id number] DOB: 28-Dec-1945 DOA: 12/29/2020 PCP: Sigmund Hazel, MD   Chief Complaint  Patient presents with  . Code Stroke  Brief Narrative: Presented for evaluation history of hypertension, GERD, tobacco abuse, osteoarthritis brought to the ED with complaint of sudden left-sided chest pain and diaphoresis and EMS felt patient was having left-sided weakness and left upper extremity peroneus longus.  Code stroke was activated.  Initial CT scan no acute CVA EKG showed no significant changes but had elevated troponin on blood work concerned about acute coronary syndrome.  Patient was seen by neurology MRI was done no acute finding cardiology was also consulted.  Patient has been subsequently hospitalized. In the ED on presentation-Temperature is 97.9 blood pressure 142/74, pulse 57 respirate of 23 oxygen sat 92% room air.  CBC largely within normal.  Sodium 134.  Glucose 157.  Initial troponin I 24-second troponin was 74.  CT head and CT angio code stroke essentially negative His chest pain did not respond to nitroglycerin but resolved spontaneously.  Subjective: Seen this am in ED Resting Mild lower chest wall pain more with movement No more chest pain. No left arm weakness. Wife at bedside  Assessment & Plan:  Elevated troponin/non-ST elevation MI versus demand ischemia Extensive CAD Patient present with chest pain and elevated troponin.  Seen by cardiology.  Patient has high coronary calcium score and recent scan.  Cardiac enzymes are minimally elevated EKG with old anteroseptal infarct.  Appreciate cardiology input patient is not placed on heparin drip, NT and resolved treatment with Norvasc telegram Imdur 30 mg metoprolol 25 mg, monitor and titrate medication as blood pressure allows.  Check TSH lipid panel and hemoglobin A1c.  Lipitor increased to 40 mg.  Continue as needed sublingual nitroglycerin.  Follow-up limited echocardiogram.  Continue plan of  care as per cards   Tobacco abuse: Cessation advised  Essential hypertension: Blood pressure currently well controlled, continue amlodipine 10, metoprolol 25 and Imdur.  Left Arm weakness during chest pain 12/29/20/code stroke but MRI negative initial CT negative seen by neurology. Stroke ruled out.  COPD not in exacerbation.  Overweight with BMI 29.  Will benefit with weight loss.  Diet Order            Diet NPO time specified  Diet effective midnight           Diet heart healthy/carb modified Room service appropriate? Yes; Fluid consistency: Thin  Diet effective now                Patient's Body mass index is 29.07 kg/m.  DVT prophylaxis: SCDs Start: 12/30/20 0127 Code Status:   Code Status: Full Code  Family Communication: plan of care discussed with patient at bedside.  Status is: admitted as Observation  The patient will require care spanning > 2 midnights and should be moved to inpatient because: IV treatments appropriate due to intensity of illness or inability to take PO and Ongoing problems with his elevated troponin concerning for acute coronary syndrome needing IV heparin and further work-up including coronary angiogram/cardiology evaluation  Dispo: The patient is from: Home              Anticipated d/c is to: Home              Patient currently is not medically stable to d/c.   Difficult to place patient No  Unresulted Labs (From admission, onward)          Start  Ordered   12/30/20 1700  Heparin level (unfractionated)  Once-Timed,   STAT        12/30/20 0819   12/30/20 0811  Hemoglobin A1c  Once,   STAT        12/30/20 0810   12/30/20 0811  TSH  Once,   STAT        12/30/20 0810        Medications reviewed:  Scheduled Meds: . amLODipine  10 mg Oral Daily  . atorvastatin  40 mg Oral Daily  . isosorbide mononitrate  30 mg Oral Daily  . metoprolol succinate  25 mg Oral Daily  . nicotine  21 mg Transdermal Daily  . sodium chloride flush  3 mL  Intravenous Once   Continuous Infusions: . sodium chloride 75 mL/hr at 12/30/20 0141  . heparin 1,200 Units/hr (12/30/20 1031)    Consultants:see note  Procedures:see note  Antimicrobials: Anti-infectives (From admission, onward)   None     Culture/Microbiology No results found for: SDES, SPECREQUEST, CULT, REPTSTATUS  Other culture-see note  Objective: Vitals: Today's Vitals   12/30/20 0646 12/30/20 0700 12/30/20 0730 12/30/20 0930  BP:  (!) 152/75  (!) 146/80  Pulse:  62 61 60  Resp:  (!) 26 (!) 25 (!) 26  Temp:      TempSrc:      SpO2:  94% 94% 95%  Weight:      Height:      PainSc: 0-No pain      No intake or output data in the 24 hours ending 12/30/20 1103 Filed Weights   12/29/20 1800  Weight: 90.6 kg   Weight change:   Intake/Output from previous day: No intake/output data recorded. Intake/Output this shift: No intake/output data recorded. Filed Weights   12/29/20 1800  Weight: 90.6 kg    Examination: General exam: AAOx3 ,NAD, weak appearing. HEENT:Oral mucosa moist, Ear/Nose WNL grossly,dentition normal. Respiratory system: bilaterally diminished,no use of accessory muscle, non tender. Cardiovascular system: S1 & S2 +, regular, No JVD. Gastrointestinal system: Abdomen soft, NT,ND, BS+. Nervous System:Alert, awake, moving extremities and grossly nonfocal Extremities: No edema, distal peripheral pulses palpable.  Skin: No rashes,no icterus. MSK: Normal muscle bulk,tone, power  Data Reviewed: I have personally reviewed following labs and imaging studies CBC: Recent Labs  Lab 12/29/20 1825 12/30/20 0127  WBC 8.5 8.6  NEUTROABS 5.2  --   HGB 14.1  14.6 13.5  HCT 43.1  43.0 40.3  MCV 91.7 91.2  PLT 201 182   Basic Metabolic Panel: Recent Labs  Lab 12/29/20 1825 12/30/20 0127  NA 134*  135  --   K 3.7  3.7  --   CL 100  99  --   CO2 26  --   GLUCOSE 157*  155*  --   BUN 17  20  --   CREATININE 0.97  0.90 0.66  CALCIUM 9.1   --    GFR: Estimated Creatinine Clearance: 91 mL/min (by C-G formula based on SCr of 0.66 mg/dL). Liver Function Tests: Recent Labs  Lab 12/29/20 1825  AST 20  ALT 18  ALKPHOS 85  BILITOT 0.8  PROT 7.3  ALBUMIN 4.2   No results for input(s): LIPASE, AMYLASE in the last 168 hours. No results for input(s): AMMONIA in the last 168 hours. Coagulation Profile: Recent Labs  Lab 12/29/20 1825  INR 1.0   Cardiac Enzymes: No results for input(s): CKTOTAL, CKMB, CKMBINDEX, TROPONINI in the last 168 hours. BNP (last 3 results)  No results for input(s): PROBNP in the last 8760 hours. HbA1C: No results for input(s): HGBA1C in the last 72 hours. CBG: Recent Labs  Lab 12/29/20 1818  GLUCAP 145*   Lipid Profile: Recent Labs    12/30/20 0127  CHOL 140  HDL 59  LDLCALC 73  TRIG 39  CHOLHDL 2.4   Thyroid Function Tests: No results for input(s): TSH, T4TOTAL, FREET4, T3FREE, THYROIDAB in the last 72 hours. Anemia Panel: No results for input(s): VITAMINB12, FOLATE, FERRITIN, TIBC, IRON, RETICCTPCT in the last 72 hours. Sepsis Labs: No results for input(s): PROCALCITON, LATICACIDVEN in the last 168 hours.  Recent Results (from the past 240 hour(s))  Resp Panel by RT-PCR (Flu A&B, Covid) Nasopharyngeal Swab     Status: None   Collection Time: 12/29/20  7:51 PM   Specimen: Nasopharyngeal Swab; Nasopharyngeal(NP) swabs in vial transport medium  Result Value Ref Range Status   SARS Coronavirus 2 by RT PCR NEGATIVE NEGATIVE Final    Comment: (NOTE) SARS-CoV-2 target nucleic acids are NOT DETECTED.  The SARS-CoV-2 RNA is generally detectable in upper respiratory specimens during the acute phase of infection. The lowest concentration of SARS-CoV-2 viral copies this assay can detect is 138 copies/mL. A negative result does not preclude SARS-Cov-2 infection and should not be used as the sole basis for treatment or other patient management decisions. A negative result may occur with   improper specimen collection/handling, submission of specimen other than nasopharyngeal swab, presence of viral mutation(s) within the areas targeted by this assay, and inadequate number of viral copies(<138 copies/mL). A negative result must be combined with clinical observations, patient history, and epidemiological information. The expected result is Negative.  Fact Sheet for Patients:  BloggerCourse.com  Fact Sheet for Healthcare Providers:  SeriousBroker.it  This test is no t yet approved or cleared by the [place] FDA and  has been authorized for detection and/or diagnosis of SARS-CoV-2 by FDA under an Emergency Use Authorization (EUA). This EUA will remain  in effect (meaning this test can be used) for the duration of the COVID-19 declaration under Section 564(b)(1) of the Act, 21 U.S.C.section 360bbb-3(b)(1), unless the authorization is terminated  or revoked sooner.       Influenza A by PCR NEGATIVE NEGATIVE Final   Influenza B by PCR NEGATIVE NEGATIVE Final    Comment: (NOTE) The Xpert Xpress SARS-CoV-2/FLU/RSV plus assay is intended as an aid in the diagnosis of influenza from Nasopharyngeal swab specimens and should not be used as a sole basis for treatment. Nasal washings and aspirates are unacceptable for Xpert Xpress SARS-CoV-2/FLU/RSV testing.  Fact Sheet for Patients: BloggerCourse.com  Fact Sheet for Healthcare Providers: SeriousBroker.it  This test is not yet approved or cleared by the [place] FDA and has been authorized for detection and/or diagnosis of SARS-CoV-2 by FDA under an Emergency Use Authorization (EUA). This EUA will remain in effect (meaning this test can be used) for the duration of the COVID-19 declaration under Section 564(b)(1) of the Act, 21 U.S.C. section 360bbb-3(b)(1), unless the authorization is terminated  or revoked.  Performed at [facility], [address], [place], [place] [address]      Radiology Studies: DG Chest 2 View  Result Date: 12/29/2020 CLINICAL DATA:  75 year old male with chest pain. EXAM: CHEST - 2 VIEW COMPARISON:  Chest radiograph dated 11/07/2020. FINDINGS: No focal consolidation, pleural effusion or pneumothorax. The cardiac silhouette is within limits. Atherosclerotic calcification of the aorta. Degenerative changes of the spine. No acute  osseous pathology. IMPRESSION: No active cardiopulmonary disease. Electronically Signed   By: Elgie Collard M.D.   On: 12/29/2020 19:59   MR BRAIN WO CONTRAST  Result Date: 12/30/2020 CLINICAL DATA:  Initial evaluation for neuro deficit, stroke suspected. EXAM: MRI HEAD WITHOUT CONTRAST TECHNIQUE: Multiplanar, multiecho pulse sequences of the brain and surrounding structures were obtained without intravenous contrast. COMPARISON:  Prior CTs from earlier the same day. FINDINGS: Brain: Examination is somewhat technically limited as a coronal DWI sequence was not performed. Generalized age-related cerebral atrophy. Patchy T2/FLAIR hyperintensity seen within the periventricular and deep white matter both cerebral hemispheres as well as the pons, most consistent with chronic small vessel ischemic disease. No abnormal foci of restricted diffusion to suggest acute or subacute ischemia. Gray-white matter differentiation maintained. No encephalomalacia to suggest chronic cortical infarction. No evidence for acute or chronic intracranial hemorrhage. No mass lesion, midline shift or mass effect. No hydrocephalus or extra-axial fluid collection. Pituitary gland suprasellar region within normal limits. Midline structures intact. Vascular: Major intracranial vascular flow voids are maintained. Skull and upper cervical spine: Craniocervical junction within normal limits. Bone marrow signal intensity normal. No scalp soft tissue abnormality.  Sinuses/Orbits: Globes and orbital soft tissues within normal limits. Paranasal sinuses are largely clear. No significant mastoid effusion. Other: None. IMPRESSION: 1. No acute intracranial abnormality. 2. Generalized age-related cerebral atrophy with mild to moderate cerebral white matter disease, most likely related to chronic microvascular ischemic disease. Electronically Signed   By: Rise Mu M.D.   On: 12/30/2020 01:12   CT HEAD CODE STROKE WO CONTRAST  Result Date: 12/29/2020 CLINICAL DATA:  Code stroke.  Left-sided weakness. EXAM: CT HEAD WITHOUT CONTRAST TECHNIQUE: Contiguous axial images were obtained from the base of the skull through the vertex without intravenous contrast. COMPARISON:  Head MRI 03/26/2014 FINDINGS: Brain: There is no evidence of an acute infarct, intracranial hemorrhage, mass, midline shift, or extra-axial fluid collection. The ventricles and sulci are within normal limits for age. Hypodensities in the cerebral white matter bilaterally are nonspecific but compatible with mild chronic small vessel ischemic disease. Vascular: Calcified atherosclerosis at the skull base. No hyperdense vessel. Skull: No acute fracture or suspicious osseous lesion. Sinuses/Orbits: Depression of the left lamina papyracea which may reflect an old medial orbital fracture. No evidence of acute inflammatory sinus disease. Clear mastoid air cells. Other: None. ASPECTS [facility] Stroke Program Early CT Score) - Ganglionic level infarction (caudate, lentiform nuclei, internal capsule, insula, M1-M3 cortex): 7 - Supraganglionic infarction (M4-M6 cortex): 3 Total score (0-10 with 10 being normal): 10 IMPRESSION: 1. No evidence of acute intracranial abnormality. 2. ASPECTS is 10. 3. Mild chronic small vessel ischemic disease. These results were communicated to Dr. Wilford Corner at 6:43 pm on 12/29/2020 by text page via the [facility] messaging system. Electronically Signed   By: Sebastian Ache M.D.   On: 12/29/2020 18:43    CT ANGIO HEAD CODE STROKE  Result Date: 12/29/2020 CLINICAL DATA:  Left-sided weakness.  Slurred speech. EXAM: CT ANGIOGRAPHY HEAD AND NECK TECHNIQUE: Multidetector CT imaging of the head and neck was performed using the standard protocol during bolus administration of intravenous contrast. Multiplanar CT image reconstructions and MIPs were obtained to evaluate the vascular anatomy. Carotid stenosis measurements (when applicable) are obtained utilizing NASCET criteria, using the distal internal carotid diameter as the denominator. CONTRAST:  30mL OMNIPAQUE IOHEXOL 350 MG/ML SOLN COMPARISON:  None. FINDINGS: CTA NECK FINDINGS Aortic arch: Standard 3 vessel aortic arch with mild atherosclerotic plaque. No significant arch vessel origin  stenosis. Right carotid system: Patent with mild calcified plaque at the carotid bifurcation and in the proximal ICA resulting in less than 50% ICA stenosis. Left carotid system: Patent with moderate calcified and soft plaque at the carotid bifurcation and in the proximal ICA resulting in 50% proximal ICA stenosis. Vertebral arteries: Patent with calcified plaque at both vertebral artery origins. No evidence of significant stenosis or dissection. Mildly dominant right vertebral artery. Skeleton: Widespread severe cervical facet arthrosis. Mild cervical disc degeneration. Other neck: Bilateral posterior cervical muscular atrophy. Upper chest: Mild centrilobular and paraseptal emphysema. Review of the MIP images confirms the above findings CTA HEAD FINDINGS Anterior circulation: The internal carotid arteries are patent from skull base to carotid termini with atherosclerotic plaque resulting in mild paraclinoid stenosis bilaterally. ACAs and MCAs are patent without evidence of a proximal branch occlusion or significant proximal stenosis. No aneurysm is identified. Posterior circulation: The intracranial vertebral arteries are patent to the basilar with mild atherosclerotic  irregularity bilaterally but no significant stenosis. Patent PICA and SCA origins are seen bilaterally. The basilar artery is widely patent. Posterior communicating arteries are diminutive or absent. Both PCAs are patent without evidence of a significant proximal stenosis. There is a severe right P3 branch vessel stenosis. No aneurysm is identified. Venous sinuses: Not well evaluated due to arterial contrast timing. Anatomic variants: None. Review of the MIP images confirms the above findings IMPRESSION: 1. No large vessel occlusion. 2. Intracranial and cervical atherosclerosis resulting in mild bilateral intracranial ICA stenoses and 60% proximal left ICA stenosis. 3.  Aortic Atherosclerosis (ICD10-I70.0). These results were communicated to Dr. Wilford Corner at 6:43 pm on 12/29/2020 by text page via the [facility] messaging system. Electronically Signed   By: Sebastian Ache M.D.   On: 12/29/2020 19:15   CT ANGIO NECK CODE STROKE  Result Date: 12/29/2020 CLINICAL DATA:  Left-sided weakness.  Slurred speech. EXAM: CT ANGIOGRAPHY HEAD AND NECK TECHNIQUE: Multidetector CT imaging of the head and neck was performed using the standard protocol during bolus administration of intravenous contrast. Multiplanar CT image reconstructions and MIPs were obtained to evaluate the vascular anatomy. Carotid stenosis measurements (when applicable) are obtained utilizing NASCET criteria, using the distal internal carotid diameter as the denominator. CONTRAST:  58mL OMNIPAQUE IOHEXOL 350 MG/ML SOLN COMPARISON:  None. FINDINGS: CTA NECK FINDINGS Aortic arch: Standard 3 vessel aortic arch with mild atherosclerotic plaque. No significant arch vessel origin stenosis. Right carotid system: Patent with mild calcified plaque at the carotid bifurcation and in the proximal ICA resulting in less than 50% ICA stenosis. Left carotid system: Patent with moderate calcified and soft plaque at the carotid bifurcation and in the proximal ICA resulting in 50%  proximal ICA stenosis. Vertebral arteries: Patent with calcified plaque at both vertebral artery origins. No evidence of significant stenosis or dissection. Mildly dominant right vertebral artery. Skeleton: Widespread severe cervical facet arthrosis. Mild cervical disc degeneration. Other neck: Bilateral posterior cervical muscular atrophy. Upper chest: Mild centrilobular and paraseptal emphysema. Review of the MIP images confirms the above findings CTA HEAD FINDINGS Anterior circulation: The internal carotid arteries are patent from skull base to carotid termini with atherosclerotic plaque resulting in mild paraclinoid stenosis bilaterally. ACAs and MCAs are patent without evidence of a proximal branch occlusion or significant proximal stenosis. No aneurysm is identified. Posterior circulation: The intracranial vertebral arteries are patent to the basilar with mild atherosclerotic irregularity bilaterally but no significant stenosis. Patent PICA and SCA origins are seen bilaterally. The basilar artery is widely patent. Posterior  communicating arteries are diminutive or absent. Both PCAs are patent without evidence of a significant proximal stenosis. There is a severe right P3 branch vessel stenosis. No aneurysm is identified. Venous sinuses: Not well evaluated due to arterial contrast timing. Anatomic variants: None. Review of the MIP images confirms the above findings IMPRESSION: 1. No large vessel occlusion. 2. Intracranial and cervical atherosclerosis resulting in mild bilateral intracranial ICA stenoses and 60% proximal left ICA stenosis. 3.  Aortic Atherosclerosis (ICD10-I70.0). These results were communicated to Dr. Wilford Corner at 6:43 pm on 12/29/2020 by text page via the [facility] messaging system. Electronically Signed   By: Sebastian Ache M.D.   On: 12/29/2020 19:15     LOS: 0 days   Lanae Boast, MD [facility]  12/30/2020, 11:03 AM

## 2020-12-30 NOTE — ED Notes (Signed)
Attempted to give report. RN unable to take report at this time.  

## 2020-12-30 NOTE — Progress Notes (Signed)
ANTICOAGULATION CONSULT NOTE  Pharmacy Consult for heparin Indication: chest pain/ACS  Heparin Dosing Weight: 90 kg  Labs: Recent Labs    12/29/20 1825 12/29/20 1832 12/29/20 2105 12/30/20 0127 12/30/20 0322  HGB 14.1  14.6  --   --  13.5  --   HCT 43.1  43.0  --   --  40.3  --   PLT 201  --   --  182  --   APTT 30  --   --   --   --   LABPROT 13.2  --   --   --   --   INR 1.0  --   --   --   --   CREATININE 0.97  0.90  --   --  0.66  --   TROPONINIHS  --    < > 176* 131* 135*   < > = values in this interval not displayed.    Estimated Creatinine Clearance: 91 mL/min (by C-G formula based on SCr of 0.66 mg/dL).  Assessment: 7 yom presenting with left-sided weakness as code stroke. Patient also with CP, elevated high-sensitivity troponin. Pharmacy consulted to dose heparin. Patient is not on anticoagulation PTA. Lovenox prophylaxis ordered but not yet given. CBC wnl. No active bleed issues documented.  Neurology doubts patient actually having a stroke and believe likely cardiac pain with radiation to arm causing weakness. MRI negative for stroke, and no further neurology work up planned.  Goal of Therapy:  Heparin level 0.3-0.7 units/ml Monitor platelets by anticoagulation protocol: Yes   Plan:  D/c Lovenox prophylaxis order (not yet given) Heparin 4000 unit bolus Start heparin at 1200 units/hr 6hr heparin level Monitor daily CBC, s/sx bleeding Cardiology planning cath 4/8   Leia Alf, PharmD, BCPS Please check AMION for all [facility] contact numbers Clinical Pharmacist 12/30/2020 8:12 AM

## 2020-12-30 NOTE — Progress Notes (Signed)
ANTICOAGULATION CONSULT NOTE  Pharmacy Consult for heparin Indication: chest pain/ACS  Heparin Dosing Weight: 90 kg  Labs: Recent Labs    12/29/20 1825 12/29/20 1832 12/29/20 2105 12/30/20 0127 12/30/20 0322 12/30/20 1652  HGB 14.1  14.6  --   --  13.5  --   --   HCT 43.1  43.0  --   --  40.3  --   --   PLT 201  --   --  182  --   --   APTT 30  --   --   --   --   --   LABPROT 13.2  --   --   --   --   --   INR 1.0  --   --   --   --   --   HEPARINUNFRC  --   --   --   --   --  0.13*  CREATININE 0.97  0.90  --   --  0.66  --   --   TROPONINIHS  --    < > 176* 131* 135*  --    < > = values in this interval not displayed.    Estimated Creatinine Clearance: 89.3 mL/min (by C-G formula based on SCr of 0.66 mg/dL).  Assessment: 74 yom presenting with left-sided weakness as code stroke. Patient also with CP, elevated high-sensitivity troponin. Pharmacy consulted to dose heparin. Patient is not on anticoagulation PTA. CBC wnl. No active bleed issues documented. Neurology doubts patient actually having a stroke and believe likely cardiac pain with radiation to arm causing weakness. MRI negative for stroke, and no further neurology work up planned.  Initial heparin level subtherapeutic at 0.13, CBC stable.   Goal of Therapy:  Heparin level 0.3-0.7 units/ml Monitor platelets by anticoagulation protocol: Yes   Plan:  Heparin 2000 units x1 Increase heparin 1450 units/h Recheck heparin level in 8h   Fredonia Highland, PharmD, [place], [facility] Clinical Pharmacist [phone] Please check AMION for all [facility] Pharmacy numbers 12/30/2020

## 2020-12-30 NOTE — Progress Notes (Signed)
Spoke with primary RN regarding the IV consult. Heart cath to be done 4/8. Instructed the RN to put a consult in before the procedure to [reserve the site. RN verbalized understanding.

## 2020-12-30 NOTE — Progress Notes (Addendum)
Cardiology Progress Note  Patient ID: Jacob Ruiz MRN: [id number] DOB: Aug 09, 1946 Date of Encounter: 12/30/2020  Primary Cardiologist: Parke Poisson, MD  Subjective   Chief Complaint: None.  HPI: Admitted overnight with sharp left-sided chest pain yesterday.  Pain did not respond to nitroglycerin.  Resolved spontaneously.  Reported weakness.  MRI negative for infarct.  Extensive coronary calcification seen on recent CT scan.  Troponins minimally elevated.  ROS:  All other ROS reviewed and negative. Pertinent positives noted in the HPI.     Inpatient Medications  Scheduled Meds: . amLODipine  10 mg Oral Daily  . atorvastatin  20 mg Oral Daily  . enoxaparin (LOVENOX) injection  40 mg Subcutaneous Daily  . isosorbide mononitrate  30 mg Oral Daily  . metoprolol succinate  25 mg Oral Daily  . nicotine  21 mg Transdermal Daily  . sodium chloride flush  3 mL Intravenous Once   Continuous Infusions: . sodium chloride 75 mL/hr at 12/30/20 0141   PRN Meds: acetaminophen, nitroGLYCERIN, ondansetron (ZOFRAN) IV   Vital Signs   Vitals:   12/30/20 0303 12/30/20 0330 12/30/20 0500 12/30/20 0630  BP: 140/72 (!) 142/72 (!) 166/80 (!) 154/71  Pulse: (!) 57 (!) 56 65 61  Resp: 18 (!) 26 17 20   Temp:      TempSrc:      SpO2: 92% 90% 92% 94%  Weight:      Height:       No intake or output data in the 24 hours ending 12/30/20 0803 Last 3 Weights 12/29/2020 12/06/2020 11/07/2020  Weight (lbs) 199 lb 11.8 oz 198 lb 3.2 oz 191 lb 2.2 oz  Weight (kg) 90.6 kg 89.903 kg 86.7 kg      Telemetry  Overnight telemetry shows sinus rhythm in the 60s, which I personally reviewed.   ECG  The most recent ECG shows sinus rhythm heart rate 55, old anteroseptal infarct, nonspecific ST-T changes, which I personally reviewed.   Physical Exam   Vitals:   12/30/20 0303 12/30/20 0330 12/30/20 0500 12/30/20 0630  BP: 140/72 (!) 142/72 (!) 166/80 (!) 154/71  Pulse: (!) 57 (!) 56 65 61  Resp: 18 (!)  26 17 20   Temp:      TempSrc:      SpO2: 92% 90% 92% 94%  Weight:      Height:       No intake or output data in the 24 hours ending 12/30/20 0803  Last 3 Weights 12/29/2020 12/06/2020 11/07/2020  Weight (lbs) 199 lb 11.8 oz 198 lb 3.2 oz 191 lb 2.2 oz  Weight (kg) 90.6 kg 89.903 kg 86.7 kg    Body mass index is 29.07 kg/m.  General: Well nourished, well developed, in no acute distress Head: Atraumatic, normal size  Eyes: PEERLA, EOMI  Neck: Supple, no JVD Endocrine: No thryomegaly Cardiac: Normal S1, S2; RRR; no murmurs, rubs, or gallops Lungs: Clear to auscultation bilaterally, no wheezing, rhonchi or rales  Abd: Soft, nontender, no hepatomegaly  Ext: No edema, pulses 2+ Musculoskeletal: No deformities, BUE and BLE strength normal and equal Skin: Warm and dry, no rashes   Neuro: Alert and oriented to person, place, time, and situation, CNII-XII grossly intact, no focal deficits  Psych: Normal mood and affect   Labs  High Sensitivity Troponin:   Recent Labs  Lab 12/29/20 1832 12/29/20 2105 12/30/20 0127 12/30/20 0322  TROPONINIHS 124* 176* 131* 135*     Cardiac EnzymesNo results for input(s): TROPONINI in the last  168 hours. No results for input(s): TROPIPOC in the last 168 hours.  Chemistry Recent Labs  Lab 12/29/20 1825 12/30/20 0127  NA 134*  135  --   K 3.7  3.7  --   CL 100  99  --   CO2 26  --   GLUCOSE 157*  155*  --   BUN 17  20  --   CREATININE 0.97  0.90 0.66  CALCIUM 9.1  --   PROT 7.3  --   ALBUMIN 4.2  --   AST 20  --   ALT 18  --   ALKPHOS 85  --   BILITOT 0.8  --   GFRNONAA >60 >60  ANIONGAP 8  --     Hematology Recent Labs  Lab 12/29/20 1825 12/30/20 0127  WBC 8.5 8.6  RBC 4.70 4.42  HGB 14.1  14.6 13.5  HCT 43.1  43.0 40.3  MCV 91.7 91.2  MCH 30.0 30.5  MCHC 32.7 33.5  RDW 12.8 12.9  PLT 201 182   BNPNo results for input(s): BNP, PROBNP in the last 168 hours.  DDimer No results for input(s): DDIMER in the last 168 hours.    Radiology  DG Chest 2 View  Result Date: 12/29/2020 CLINICAL DATA:  75 year old male with chest pain. EXAM: CHEST - 2 VIEW COMPARISON:  Chest radiograph dated 11/07/2020. FINDINGS: No focal consolidation, pleural effusion or pneumothorax. The cardiac silhouette is within limits. Atherosclerotic calcification of the aorta. Degenerative changes of the spine. No acute osseous pathology. IMPRESSION: No active cardiopulmonary disease. Electronically Signed   By: Elgie Collard M.D.   On: 12/29/2020 19:59   MR BRAIN WO CONTRAST  Result Date: 12/30/2020 CLINICAL DATA:  Initial evaluation for neuro deficit, stroke suspected. EXAM: MRI HEAD WITHOUT CONTRAST TECHNIQUE: Multiplanar, multiecho pulse sequences of the brain and surrounding structures were obtained without intravenous contrast. COMPARISON:  Prior CTs from earlier the same day. FINDINGS: Brain: Examination is somewhat technically limited as a coronal DWI sequence was not performed. Generalized age-related cerebral atrophy. Patchy T2/FLAIR hyperintensity seen within the periventricular and deep white matter both cerebral hemispheres as well as the pons, most consistent with chronic small vessel ischemic disease. No abnormal foci of restricted diffusion to suggest acute or subacute ischemia. Gray-white matter differentiation maintained. No encephalomalacia to suggest chronic cortical infarction. No evidence for acute or chronic intracranial hemorrhage. No mass lesion, midline shift or mass effect. No hydrocephalus or extra-axial fluid collection. Pituitary gland suprasellar region within normal limits. Midline structures intact. Vascular: Major intracranial vascular flow voids are maintained. Skull and upper cervical spine: Craniocervical junction within normal limits. Bone marrow signal intensity normal. No scalp soft tissue abnormality. Sinuses/Orbits: Globes and orbital soft tissues within normal limits. Paranasal sinuses are largely clear. No  significant mastoid effusion. Other: None. IMPRESSION: 1. No acute intracranial abnormality. 2. Generalized age-related cerebral atrophy with mild to moderate cerebral white matter disease, most likely related to chronic microvascular ischemic disease. Electronically Signed   By: Rise Mu M.D.   On: 12/30/2020 01:12   CT HEAD CODE STROKE WO CONTRAST  Result Date: 12/29/2020 CLINICAL DATA:  Code stroke.  Left-sided weakness. EXAM: CT HEAD WITHOUT CONTRAST TECHNIQUE: Contiguous axial images were obtained from the base of the skull through the vertex without intravenous contrast. COMPARISON:  Head MRI 03/26/2014 FINDINGS: Brain: There is no evidence of an acute infarct, intracranial hemorrhage, mass, midline shift, or extra-axial fluid collection. The ventricles and sulci are within normal limits for age.  Hypodensities in the cerebral white matter bilaterally are nonspecific but compatible with mild chronic small vessel ischemic disease. Vascular: Calcified atherosclerosis at the skull base. No hyperdense vessel. Skull: No acute fracture or suspicious osseous lesion. Sinuses/Orbits: Depression of the left lamina papyracea which may reflect an old medial orbital fracture. No evidence of acute inflammatory sinus disease. Clear mastoid air cells. Other: None. ASPECTS [facility]([place] Stroke Program Early CT Score) - Ganglionic level infarction (caudate, lentiform nuclei, internal capsule, insula, M1-M3 cortex): 7 - Supraganglionic infarction (M4-M6 cortex): 3 Total score (0-10 with 10 being normal): 10 IMPRESSION: 1. No evidence of acute intracranial abnormality. 2. ASPECTS is 10. 3. Mild chronic small vessel ischemic disease. These results were communicated to Dr. Wilford CornerArora at 6:43 pm on 12/29/2020 by text page via the [facility] messaging system. Electronically Signed   By: Sebastian AcheAllen  Grady M.D.   On: 12/29/2020 18:43   CT ANGIO HEAD CODE STROKE  Result Date: 12/29/2020 CLINICAL DATA:  Left-sided weakness.  Slurred speech.  EXAM: CT ANGIOGRAPHY HEAD AND NECK TECHNIQUE: Multidetector CT imaging of the head and neck was performed using the standard protocol during bolus administration of intravenous contrast. Multiplanar CT image reconstructions and MIPs were obtained to evaluate the vascular anatomy. Carotid stenosis measurements (when applicable) are obtained utilizing NASCET criteria, using the distal internal carotid diameter as the denominator. CONTRAST:  75mL OMNIPAQUE IOHEXOL 350 MG/ML SOLN COMPARISON:  None. FINDINGS: CTA NECK FINDINGS Aortic arch: Standard 3 vessel aortic arch with mild atherosclerotic plaque. No significant arch vessel origin stenosis. Right carotid system: Patent with mild calcified plaque at the carotid bifurcation and in the proximal ICA resulting in less than 50% ICA stenosis. Left carotid system: Patent with moderate calcified and soft plaque at the carotid bifurcation and in the proximal ICA resulting in 50% proximal ICA stenosis. Vertebral arteries: Patent with calcified plaque at both vertebral artery origins. No evidence of significant stenosis or dissection. Mildly dominant right vertebral artery. Skeleton: Widespread severe cervical facet arthrosis. Mild cervical disc degeneration. Other neck: Bilateral posterior cervical muscular atrophy. Upper chest: Mild centrilobular and paraseptal emphysema. Review of the MIP images confirms the above findings CTA HEAD FINDINGS Anterior circulation: The internal carotid arteries are patent from skull base to carotid termini with atherosclerotic plaque resulting in mild paraclinoid stenosis bilaterally. ACAs and MCAs are patent without evidence of a proximal branch occlusion or significant proximal stenosis. No aneurysm is identified. Posterior circulation: The intracranial vertebral arteries are patent to the basilar with mild atherosclerotic irregularity bilaterally but no significant stenosis. Patent PICA and SCA origins are seen bilaterally. The basilar  artery is widely patent. Posterior communicating arteries are diminutive or absent. Both PCAs are patent without evidence of a significant proximal stenosis. There is a severe right P3 branch vessel stenosis. No aneurysm is identified. Venous sinuses: Not well evaluated due to arterial contrast timing. Anatomic variants: None. Review of the MIP images confirms the above findings IMPRESSION: 1. No large vessel occlusion. 2. Intracranial and cervical atherosclerosis resulting in mild bilateral intracranial ICA stenoses and 60% proximal left ICA stenosis. 3.  Aortic Atherosclerosis (ICD10-I70.0). These results were communicated to Dr. Wilford CornerArora at 6:43 pm on 12/29/2020 by text page via the [facility] messaging system. Electronically Signed   By: Sebastian AcheAllen  Grady M.D.   On: 12/29/2020 19:15   CT ANGIO NECK CODE STROKE  Result Date: 12/29/2020 CLINICAL DATA:  Left-sided weakness.  Slurred speech. EXAM: CT ANGIOGRAPHY HEAD AND NECK TECHNIQUE: Multidetector CT imaging of the head and neck was performed  using the standard protocol during bolus administration of intravenous contrast. Multiplanar CT image reconstructions and MIPs were obtained to evaluate the vascular anatomy. Carotid stenosis measurements (when applicable) are obtained utilizing NASCET criteria, using the distal internal carotid diameter as the denominator. CONTRAST:  40mL OMNIPAQUE IOHEXOL 350 MG/ML SOLN COMPARISON:  None. FINDINGS: CTA NECK FINDINGS Aortic arch: Standard 3 vessel aortic arch with mild atherosclerotic plaque. No significant arch vessel origin stenosis. Right carotid system: Patent with mild calcified plaque at the carotid bifurcation and in the proximal ICA resulting in less than 50% ICA stenosis. Left carotid system: Patent with moderate calcified and soft plaque at the carotid bifurcation and in the proximal ICA resulting in 50% proximal ICA stenosis. Vertebral arteries: Patent with calcified plaque at both vertebral artery origins. No evidence of  significant stenosis or dissection. Mildly dominant right vertebral artery. Skeleton: Widespread severe cervical facet arthrosis. Mild cervical disc degeneration. Other neck: Bilateral posterior cervical muscular atrophy. Upper chest: Mild centrilobular and paraseptal emphysema. Review of the MIP images confirms the above findings CTA HEAD FINDINGS Anterior circulation: The internal carotid arteries are patent from skull base to carotid termini with atherosclerotic plaque resulting in mild paraclinoid stenosis bilaterally. ACAs and MCAs are patent without evidence of a proximal branch occlusion or significant proximal stenosis. No aneurysm is identified. Posterior circulation: The intracranial vertebral arteries are patent to the basilar with mild atherosclerotic irregularity bilaterally but no significant stenosis. Patent PICA and SCA origins are seen bilaterally. The basilar artery is widely patent. Posterior communicating arteries are diminutive or absent. Both PCAs are patent without evidence of a significant proximal stenosis. There is a severe right P3 branch vessel stenosis. No aneurysm is identified. Venous sinuses: Not well evaluated due to arterial contrast timing. Anatomic variants: None. Review of the MIP images confirms the above findings IMPRESSION: 1. No large vessel occlusion. 2. Intracranial and cervical atherosclerosis resulting in mild bilateral intracranial ICA stenoses and 60% proximal left ICA stenosis. 3.  Aortic Atherosclerosis (ICD10-I70.0). These results were communicated to Dr. Wilford Corner at 6:43 pm on 12/29/2020 by text page via the [facility] messaging system. Electronically Signed   By: Sebastian Ache M.D.   On: 12/29/2020 19:15    Cardiac Studies  CCTA 11/08/2020  IMPRESSION: 1. Coronary artery calcium score 4057 Agatston units. This places the patient in the 97th percentile for age and gender, suggesting high risk for future cardiac events.  2. Very difficult study to interpret due to  very high calcium score.  3. Suspect there is not hemodynamically significant stenosis in the RCA, but difficult to comment on the more distal PDA and PLV due to blooming artifact.  4.  There does not appear to be severe disease in the LAD system.  5. Difficult to comment on proximal to mid LCX due to extensive blooming artifact.  TTE 11/08/2020 1. Left ventricular ejection fraction, by estimation, is 60 to 65%. The  left ventricle has normal function. The left ventricle has no regional  wall motion abnormalities. There is mild left ventricular hypertrophy.  Left ventricular diastolic parameters  are consistent with Grade I diastolic dysfunction (impaired relaxation).  2. Right ventricular systolic function is normal. The right ventricular  size is normal. Tricuspid regurgitation signal is inadequate for assessing  PA pressure.  3. The mitral valve is normal in structure. Trivial mitral valve  regurgitation. No evidence of mitral stenosis.  4. The aortic valve was not well visualized. Aortic valve regurgitation  is not visualized. Mild aortic valve sclerosis  is present, with no  evidence of aortic valve stenosis.  5. The inferior vena cava is normal in size with greater than 50%  respiratory variability, suggesting right atrial pressure of 3 mmHg.  6. Technically difficult study with poor acoustic windows.   Patient Profile  LEONARDO KOOB is a 75 y.o. male with diffuse CAD, COPD, hypertension who was admitted on 12/29/2020 with acute left-sided chest pain and weakness.  Cardiology consulted for elevated troponin.  Assessment & Plan   1.  Elevated troponin/non-STEMI versus demand ischemia/extensive CAD -No history of extensive CAD with very elevated coronary calcium score.  I do fear that his anatomy was not delineated well on coronary CTA given the amount of calcium he has.  FFR also demonstrated obstructive lesions. -He presents with atypical chest pain.  Described as  sharp left-sided pain that is described as stabbing.  Occurred after working with his grandson on the ceiling fan for several hours.  Also had some weakness.  He is ruled out for stroke. -Cardiac enzymes are minimally elevated.  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  It is unchanged from prior. -His symptoms could have been stress related or blood pressure related and his troponin elevation is secondary.  However, given his extensive CAD seen on CTA I think he merits a left heart catheterization. -He has been given aspirin.  Troponin is not rising.  Chest pain has resolved.  We will go ahead and start heparin drip. -We will plan for left heart catheterization tomorrow.  N.p.o. at midnight. -Continue home antianginals including Norvasc 10 mg daily, Imdur 30 mg daily, metoprolol succinate 25 mg daily.  Titrate up for better blood pressure control. -TSH and A1c added.  Increase Lipitor to 40 mg daily. -Sublingual nitroglycerin as needed for chest pain. -Continue high intensity statin. -We will also repeat a limited echocardiogram to evaluate for wall motion.  For questions or updates, please contact [facility] Please consult [email or web address] for contact info under   Time Spent with Patient: I have spent a total of 25 minutes with patient reviewing hospital notes, telemetry, EKGs, labs and examining the patient as well as establishing an assessment and plan that was discussed with the patient.  > 50% of time was spent in direct patient care.    Signed, Lenna Gilford. Flora Lipps, MD, [facility]  [facility]  12/30/2020 8:03 AM

## 2020-12-31 ENCOUNTER — Inpatient Hospital Stay (HOSPITAL_COMMUNITY): Payer: Medicare Other

## 2020-12-31 ENCOUNTER — Encounter (HOSPITAL_COMMUNITY)
Admission: EM | Disposition: A | Discharge status: Home / Self Care | Payer: Self-pay | Source: Home / Self Care | Attending: Internal Medicine

## 2020-12-31 DIAGNOSIS — I214 Non-ST elevation (NSTEMI) myocardial infarction: Secondary | ICD-10-CM | POA: Diagnosis not present

## 2020-12-31 DIAGNOSIS — I2510 Atherosclerotic heart disease of native coronary artery without angina pectoris: Secondary | ICD-10-CM

## 2020-12-31 HISTORY — PX: LEFT HEART CATH AND CORONARY ANGIOGRAPHY: CATH118249

## 2020-12-31 HISTORY — PX: CORONARY STENT INTERVENTION: CATH118234

## 2020-12-31 LAB — ECHOCARDIOGRAM LIMITED
AR max vel: 1.29 cm2
AV Area VTI: 1.37 cm2
AV Area mean vel: 1.37 cm2
AV Mean grad: 7.7 mmHg
AV Peak grad: 15.4 mmHg
Ao pk vel: 1.96 m/s
Area-P 1/2: 2.24 cm2
Height: 69.5 in
S' Lateral: 3.5 cm
Weight: 3079.39 oz

## 2020-12-31 LAB — BASIC METABOLIC PANEL
Anion gap: 5 (ref 5–15)
BUN: 8 mg/dL (ref 8–23)
CO2: 29 mmol/L (ref 22–32)
Calcium: 8.6 mg/dL — ABNORMAL LOW (ref 8.9–10.3)
Chloride: 102 mmol/L (ref 98–111)
Creatinine, Ser: 0.62 mg/dL (ref 0.61–1.24)
GFR, Estimated: >60 mL/min (ref >60)
Glucose, Bld: 95 mg/dL (ref 70–99)
Potassium: 3.9 mmol/L (ref 3.5–5.1)
Sodium: 136 mmol/L (ref 135–145)

## 2020-12-31 LAB — POCT ACTIVATED CLOTTING TIME
Activated Clotting Time: 249 seconds
Activated Clotting Time: 279 seconds
Activated Clotting Time: 297 seconds

## 2020-12-31 LAB — CBC
HCT: 39 % (ref 39.0–52.0)
Hemoglobin: 13 g/dL (ref 13.0–17.0)
MCH: 30.3 pg (ref 26.0–34.0)
MCHC: 33.3 g/dL (ref 30.0–36.0)
MCV: 90.9 fL (ref 80.0–100.0)
Platelets: 168 10*3/uL (ref 150–400)
RBC: 4.29 MIL/uL (ref 4.22–5.81)
RDW: 12.8 % (ref 11.5–15.5)
WBC: 6.4 10*3/uL (ref 4.0–10.5)
nRBC: 0 % (ref 0.0–0.2)

## 2020-12-31 LAB — HEPARIN LEVEL (UNFRACTIONATED): Heparin Unfractionated: 0.3 IU/mL (ref 0.30–0.70)

## 2020-12-31 SURGERY — LEFT HEART CATH AND CORONARY ANGIOGRAPHY
Anesthesia: LOCAL

## 2020-12-31 MED ORDER — VERAPAMIL HCL 2.5 MG/ML IV SOLN
INTRAVENOUS | Status: DC | PRN
  Administered 2020-12-31: 10 mL via INTRA_ARTERIAL

## 2020-12-31 MED ORDER — HEPARIN (PORCINE) IN NACL 1000-0.9 UT/500ML-% IV SOLN
INTRAVENOUS | Status: AC
  Filled 2020-12-31: qty 1000

## 2020-12-31 MED ORDER — SODIUM CHLORIDE 0.9% FLUSH
3.0000 mL | Freq: Two times a day (BID) | INTRAVENOUS | Status: DC
  Administered 2020-12-31 – 2021-01-01 (×2): 3 mL via INTRAVENOUS

## 2020-12-31 MED ORDER — NITROGLYCERIN 1 MG/10 ML FOR IR/CATH LAB
INTRA_ARTERIAL | Status: AC
  Filled 2020-12-31: qty 10

## 2020-12-31 MED ORDER — LIDOCAINE HCL (PF) 1 % IJ SOLN
INTRAMUSCULAR | Status: AC
  Filled 2020-12-31: qty 30

## 2020-12-31 MED ORDER — ENOXAPARIN SODIUM 40 MG/0.4ML ~~LOC~~ SOLN
40.0000 mg | SUBCUTANEOUS | Status: DC
  Administered 2021-01-01: 40 mg via SUBCUTANEOUS
  Filled 2020-12-31: qty 0.4

## 2020-12-31 MED ORDER — FENTANYL CITRATE (PF) 100 MCG/2ML IJ SOLN
INTRAMUSCULAR | Status: DC | PRN
  Administered 2020-12-31: 50 ug via INTRAVENOUS
  Administered 2020-12-31: 25 ug via INTRAVENOUS

## 2020-12-31 MED ORDER — FENTANYL CITRATE (PF) 100 MCG/2ML IJ SOLN
INTRAMUSCULAR | Status: AC
  Filled 2020-12-31: qty 2

## 2020-12-31 MED ORDER — LIDOCAINE HCL (PF) 1 % IJ SOLN
INTRAMUSCULAR | Status: DC | PRN
  Administered 2020-12-31: 2 mL

## 2020-12-31 MED ORDER — ENSURE ENLIVE PO LIQD
237.0000 mL | Freq: Two times a day (BID) | ORAL | Status: DC
  Administered 2020-12-31 – 2021-01-01 (×2): 237 mL via ORAL

## 2020-12-31 MED ORDER — SODIUM CHLORIDE 0.9 % IV SOLN: INTRAVENOUS | Status: AC

## 2020-12-31 MED ORDER — HEPARIN SODIUM (PORCINE) 1000 UNIT/ML IJ SOLN
INTRAMUSCULAR | Status: AC
  Filled 2020-12-31: qty 1

## 2020-12-31 MED ORDER — MIDAZOLAM HCL 2 MG/2ML IJ SOLN
INTRAMUSCULAR | Status: AC
  Filled 2020-12-31: qty 2

## 2020-12-31 MED ORDER — HEPARIN SODIUM (PORCINE) 1000 UNIT/ML IJ SOLN
INTRAMUSCULAR | Status: DC | PRN
  Administered 2020-12-31: 4500 [IU] via INTRAVENOUS
  Administered 2020-12-31: 3000 [IU] via INTRAVENOUS
  Administered 2020-12-31: 2000 [IU] via INTRAVENOUS
  Administered 2020-12-31: 4500 [IU] via INTRAVENOUS

## 2020-12-31 MED ORDER — HYDRALAZINE HCL 20 MG/ML IJ SOLN: 10.0000 mg | INTRAMUSCULAR | Status: AC | PRN

## 2020-12-31 MED ORDER — TICAGRELOR 90 MG PO TABS
ORAL_TABLET | ORAL | Status: AC
  Filled 2020-12-31: qty 2

## 2020-12-31 MED ORDER — SODIUM CHLORIDE 0.9% FLUSH: 3.0000 mL | INTRAVENOUS | Status: DC | PRN

## 2020-12-31 MED ORDER — TICAGRELOR 90 MG PO TABS
90.0000 mg | ORAL_TABLET | Freq: Two times a day (BID) | ORAL | Status: DC
  Administered 2020-12-31 – 2021-01-01 (×2): 90 mg via ORAL
  Filled 2020-12-31 (×2): qty 1

## 2020-12-31 MED ORDER — TICAGRELOR 90 MG PO TABS
ORAL_TABLET | ORAL | Status: DC | PRN
  Administered 2020-12-31: 180 mg via ORAL

## 2020-12-31 MED ORDER — ASPIRIN 81 MG PO CHEW
81.0000 mg | CHEWABLE_TABLET | Freq: Every day | ORAL | Status: DC
  Administered 2021-01-01: 81 mg via ORAL
  Filled 2020-12-31: qty 1

## 2020-12-31 MED ORDER — LABETALOL HCL 5 MG/ML IV SOLN: 10.0000 mg | INTRAVENOUS | Status: AC | PRN

## 2020-12-31 MED ORDER — IOHEXOL 350 MG/ML SOLN
INTRAVENOUS | Status: DC | PRN
  Administered 2020-12-31: 135 mL

## 2020-12-31 MED ORDER — NITROGLYCERIN 1 MG/10 ML FOR IR/CATH LAB
INTRA_ARTERIAL | Status: DC | PRN
Start: 2020-12-31 — End: 2020-12-31
  Administered 2020-12-31 (×2): 200 ug

## 2020-12-31 MED ORDER — HEPARIN (PORCINE) IN NACL 1000-0.9 UT/500ML-% IV SOLN
INTRAVENOUS | Status: DC | PRN
  Administered 2020-12-31 (×2): 500 mL

## 2020-12-31 MED ORDER — SODIUM CHLORIDE 0.9 % IV SOLN: 250.0000 mL | INTRAVENOUS | Status: DC | PRN

## 2020-12-31 MED ORDER — MIDAZOLAM HCL 2 MG/2ML IJ SOLN
INTRAMUSCULAR | Status: DC | PRN
  Administered 2020-12-31 (×2): 1 mg via INTRAVENOUS

## 2020-12-31 MED ORDER — VERAPAMIL HCL 2.5 MG/ML IV SOLN
INTRAVENOUS | Status: AC
  Filled 2020-12-31: qty 2

## 2020-12-31 SURGICAL SUPPLY — 21 items
BALLN SAPPHIRE 2.0X12 (BALLOONS) ×2
BALLN ~~LOC~~ EMERGE MR 2.5X12 (BALLOONS) ×2
BALLOON SAPPHIRE 2.0X12 (BALLOONS) ×1 IMPLANT
BALLOON ~~LOC~~ EMERGE MR 2.5X12 (BALLOONS) ×1 IMPLANT
CATH 5FR JL3.5 JR4 ANG PIG MP (CATHETERS) ×2 IMPLANT
CATH VISTA GUIDE 6FR XBLAD3.5 (CATHETERS) ×2 IMPLANT
GLIDESHEATH SLEND A-KIT 6F 22G (SHEATH) ×2 IMPLANT
GLIDESHEATH SLEND SS 6F .021 (SHEATH) ×2 IMPLANT
GUIDEWIRE INQWIRE 1.5J.035X260 (WIRE) ×1 IMPLANT
INQWIRE 1.5J .035X260CM (WIRE) ×2
KIT ENCORE 26 ADVANTAGE (KITS) ×2 IMPLANT
KIT ESSENTIALS PG (KITS) ×2 IMPLANT
KIT HEART LEFT (KITS) ×2 IMPLANT
PACK CARDIAC CATHETERIZATION (CUSTOM PROCEDURE TRAY) ×2 IMPLANT
SHEATH PROBE COVER 6X72 (BAG) ×2 IMPLANT
STENT RESOLUTE ONYX 2.0X12 (Permanent Stent) ×2 IMPLANT
STENT RESOLUTE ONYX 2.25X15 (Permanent Stent) ×2 IMPLANT
SYR MEDRAD MARK 7 150ML (SYRINGE) ×2 IMPLANT
TRANSDUCER W/STOPCOCK (MISCELLANEOUS) ×2 IMPLANT
TUBING CIL FLEX 10 FLL-RA (TUBING) ×2 IMPLANT
WIRE RUNTHROUGH .014X180CM (WIRE) ×2 IMPLANT

## 2020-12-31 NOTE — Progress Notes (Signed)
Initial Nutrition Assessment  DOCUMENTATION CODES:   Not applicable  INTERVENTION:  Provide Ensure Enlive po BID, each supplement provides 350 kcal and 20 grams of protein.  Diet education handout given.   NUTRITION DIAGNOSIS:   Increased nutrient needs related to chronic illness (COPD) as evidenced by estimated needs.  GOAL:   Patient will meet greater than or equal to 90% of their needs  MONITOR:   PO intake,Supplement acceptance,Skin,Weight trends,Labs,I & O's  REASON FOR ASSESSMENT:   Consult Diet education  ASSESSMENT:   75 year old male with history of COPD, HTN, GERD, osteoarthritis presents with sudden left-sided chest pain and diaphoresis. Pt with elevated troponin/non-ST elevation MI versus demand ischemia, extensive CAD. MRI negative for stroke.   Pt underwent left heart cath and coronary angiography todayt. Pt unavailable during time of visit. RD unable to obtain pt nutrition history at this time. Diet has been advanced. RD to order nutritional supplements to aid in caloric and protein needs as well as in post op healing. RD additionally consulted for diet education regarding pre-diabetes. Handout "General, Healthful Nutrition Therapy" from the Academy of Nutrition and Dietetics Manual placed in pt's discharge instructions.   Unable to complete Nutrition-Focused physical exam at this time.   Labs and medications reviewed.   Diet Order:   Diet Order            Diet Heart Room service appropriate? Yes; Fluid consistency: Thin  Diet effective now                 EDUCATION NEEDS:   Education needs have been addressed  Skin:  Skin Assessment: Reviewed RN Assessment  Last BM:  4/5  Height:   Ht Readings from Last 1 Encounters:  12/30/20 5' 9.5" (1.765 m)    Weight:   Wt Readings from Last 1 Encounters:  12/31/20 87.3 kg   BMI:  Body mass index is 28.01 kg/m.  Estimated Nutritional Needs:   Kcal:  2100-2300  Protein:  105-115  grams  Fluid:  >/= 2 L/day  Roslyn Smiling, MS, RD, LDN RD pager number/after hours weekend pager number on Amion.

## 2020-12-31 NOTE — Progress Notes (Signed)
PROGRESS NOTE    Jacob Ruiz  BHA:[id number] DOB: 07-Jun-1946 DOA: 12/29/2020 PCP: Sigmund Hazel, MD   Chief Complaint  Patient presents with  . Code Stroke  Brief Narrative: Presented for evaluation history of hypertension, GERD, tobacco abuse, osteoarthritis brought to the ED with complaint of sudden left-sided chest pain and diaphoresis and EMS felt patient was having left-sided weakness and left upper extremity peroneus longus.  Code stroke was activated.  Initial CT scan no acute CVA EKG showed no significant changes but had elevated troponin on blood work concerned about acute coronary syndrome.  Patient was seen by neurology MRI was done no acute finding cardiology was also consulted.  Patient has been subsequently hospitalized. In the ED on presentation-Temperature is 97.9 blood pressure 142/74, pulse 57 respirate of 23 oxygen sat 92% room air.  CBC largely within normal.  Sodium 134.  Glucose 157.  Initial troponin I 24-second troponin was 74.  CT head and CT angio code stroke essentially negative His chest pain did not respond to nitroglycerin but resolved spontaneously. Patient was placed on heparin drip followed by cardiology plan for cardiac catheter 4/8  Subjective: Seen this morning.  He was resting comfortably.  He was complaining of mild pain on chest left side Afebrile overnight He Is waiting for cardiac cath this morning.  He has no new complaints.  Assessment & Plan:  Elevated troponin/non-ST elevation MI versus demand ischemia Extensive CAD based on coronary CTA: Patient present with chest pain and elevated troponin.  Seen by cardiology.  Patient has high coronary calcium score and recent scan.  Cardiac enzymes are minimally elevated EKG with old anteroseptal infarct.  Appreciate cardiology input on board for cardiac cath today.  Continue heparin drip, continue Imdur 30 mg metoprolol amlodipine and Lipitor 40 mg.TSH is normal, hemoglobin A1c 6.1 indicating prediabetes  will need dietary education, lipid panel shows LDL is controlled at 73, HDL 59.  Awaiting for cardiac cath and further plan from cardiology.  Tobacco abuse: Cessation advised  Essential hypertension: Controlled on amlodipine 10, metoprolol 25 and Imdur.  Left Arm weakness during chest pain 12/29/20/code stroke but MRI negative initial CT negative seen by neurology. Stroke ruled out.  COPD not in exacerbation.  Continue as needed inhalers  Overweight with BMI 29.  Will benefit with weight loss.  Diet Order            Diet NPO time specified Except for: Sips with Meds  Diet effective midnight                Patient's Body mass index is 28.01 kg/m.  DVT prophylaxis: SCDs Start: 12/30/20 0127 Code Status:   Code Status: Full Code  Family Communication: plan of care discussed with patient at bedside.  Status is: Inpatient  Remains inpatient for management of ACS  Dispo: The patient is from: Home              Anticipated d/c is to: Home              Patient currently is not medically stable to d/c.   Difficult to place patient No  Unresulted Labs (From admission, onward)          Start     Ordered   01/01/21 0500  Heparin level (unfractionated)  Daily,   R      12/30/20 1830   12/31/20 0500  CBC  Daily,   R      12/30/20 1830  Medications reviewed:  Scheduled Meds: . amLODipine  10 mg Oral Daily  . atorvastatin  40 mg Oral Daily  . isosorbide mononitrate  30 mg Oral Daily  . metoprolol succinate  25 mg Oral Daily  . nicotine  21 mg Transdermal Daily  . sodium chloride flush  3 mL Intravenous Once  . sodium chloride flush  3 mL Intravenous Q12H   Continuous Infusions: . sodium chloride 75 mL/hr at 12/30/20 1523  . sodium chloride    . sodium chloride 1 mL/kg/hr (12/31/20 0443)  . heparin 1,450 Units/hr (12/30/20 1833)    Consultants:see note  Procedures:see note  Antimicrobials: Anti-infectives (From admission, onward)   None      Culture/Microbiology No results found for: SDES, SPECREQUEST, CULT, REPTSTATUS  Other culture-see note  Objective: Vitals: Today's Vitals   12/30/20 1450 12/30/20 1943 12/31/20 0024 12/31/20 0540  BP: 140/75 135/78 109/60 135/71  Pulse: 61 64 (!) 55 60  Resp: 20 19 17 18   Temp: 98.3 F (36.8 C) 98.3 F (36.8 C)  98.3 F (36.8 C)  TempSrc: Oral Oral  Oral  SpO2: 92% 92% 97% 92%  Weight: 87 kg   87.3 kg  Height: 5' 9.5" (1.765 m)     PainSc: 0-No pain 0-No pain      Intake/Output Summary (Last 24 hours) at 12/31/2020 0809 Last data filed at 12/31/2020 0400 Gross per 24 hour  Intake 2783.75 ml  Output 2200 ml  Net 583.75 ml   Filed Weights   12/29/20 1800 12/30/20 1450 12/31/20 0540  Weight: 90.6 kg 87 kg 87.3 kg   Weight change: -3.646 kg  Intake/Output from previous day: 04/07 0701 - 04/08 0700 In: 2783.8 [P.O.:400; I.V.:2383.8] Out: 2200 [Urine:2200] Intake/Output this shift: No intake/output data recorded. Filed Weights   12/29/20 1800 12/30/20 1450 12/31/20 0540  Weight: 90.6 kg 87 kg 87.3 kg    Examination: General exam: AAOx3,NAD, weak appearing. HEENT:Oral mucosa moist, Ear/Nose WNL grossly, dentition normal. Respiratory system: bilaterally diminished,no wheezing or crackles,no use of accessory muscle Cardiovascular system: S1 & S2 +, No JVD,. Gastrointestinal system: Abdomen soft, NT,ND, BS+ Nervous System:Alert, awake, moving extremities and grossly nonfocal Extremities: No edema, distal peripheral pulses palpable.  Skin: No rashes,no icterus. MSK: Normal muscle bulk,tone, power  Data Reviewed: I have personally reviewed following labs and imaging studies CBC: Recent Labs  Lab 12/29/20 1825 12/30/20 0127 12/31/20 0317  WBC 8.5 8.6 6.4  NEUTROABS 5.2  --   --   HGB 14.1  14.6 13.5 13.0  HCT 43.1  43.0 40.3 39.0  MCV 91.7 91.2 90.9  PLT 201 182 168   Basic Metabolic Panel: Recent Labs  Lab 12/29/20 1825 12/30/20 0127 12/31/20 0446  NA  134*  135  --  136  K 3.7  3.7  --  3.9  CL 100  99  --  102  CO2 26  --  29  GLUCOSE 157*  155*  --  95  BUN 17  20  --  8  CREATININE 0.97  0.90 0.66 0.62  CALCIUM 9.1  --  8.6*   GFR: Estimated Creatinine Clearance: 89.5 mL/min (by C-G formula based on SCr of 0.62 mg/dL). Liver Function Tests: Recent Labs  Lab 12/29/20 1825  AST 20  ALT 18  ALKPHOS 85  BILITOT 0.8  PROT 7.3  ALBUMIN 4.2   No results for input(s): LIPASE, AMYLASE in the last 168 hours. No results for input(s): AMMONIA in the last 168 hours. Coagulation Profile:  Recent Labs  Lab 12/29/20 1825  INR 1.0   Cardiac Enzymes: No results for input(s): CKTOTAL, CKMB, CKMBINDEX, TROPONINI in the last 168 hours. BNP (last 3 results) No results for input(s): PROBNP in the last 8760 hours. HbA1C: Recent Labs    12/30/20 1035  HGBA1C 6.1*   CBG: Recent Labs  Lab 12/29/20 1818  GLUCAP 145*   Lipid Profile: Recent Labs    12/30/20 0127  CHOL 140  HDL 59  LDLCALC 73  TRIG 39  CHOLHDL 2.4   Thyroid Function Tests: Recent Labs    12/30/20 1035  TSH 0.938   Anemia Panel: No results for input(s): VITAMINB12, FOLATE, FERRITIN, TIBC, IRON, RETICCTPCT in the last 72 hours. Sepsis Labs: No results for input(s): PROCALCITON, LATICACIDVEN in the last 168 hours.  Recent Results (from the past 240 hour(s))  Resp Panel by RT-PCR (Flu A&B, Covid) Nasopharyngeal Swab     Status: None   Collection Time: 12/29/20  7:51 PM   Specimen: Nasopharyngeal Swab; Nasopharyngeal(NP) swabs in vial transport medium  Result Value Ref Range Status   SARS Coronavirus 2 by RT PCR NEGATIVE NEGATIVE Final    Comment: (NOTE) SARS-CoV-2 target nucleic acids are NOT DETECTED.  The SARS-CoV-2 RNA is generally detectable in upper respiratory specimens during the acute phase of infection. The lowest concentration of SARS-CoV-2 viral copies this assay can detect is 138 copies/mL. A negative result does not preclude  SARS-Cov-2 infection and should not be used as the sole basis for treatment or other patient management decisions. A negative result may occur with  improper specimen collection/handling, submission of specimen other than nasopharyngeal swab, presence of viral mutation(s) within the areas targeted by this assay, and inadequate number of viral copies(<138 copies/mL). A negative result must be combined with clinical observations, patient history, and epidemiological information. The expected result is Negative.  Fact Sheet for Patients:  BloggerCourse.com  Fact Sheet for Healthcare Providers:  SeriousBroker.it  This test is no t yet approved or cleared by the [place] FDA and  has been authorized for detection and/or diagnosis of SARS-CoV-2 by FDA under an Emergency Use Authorization (EUA). This EUA will remain  in effect (meaning this test can be used) for the duration of the COVID-19 declaration under Section 564(b)(1) of the Act, 21 U.S.C.section 360bbb-3(b)(1), unless the authorization is terminated  or revoked sooner.       Influenza A by PCR NEGATIVE NEGATIVE Final   Influenza B by PCR NEGATIVE NEGATIVE Final    Comment: (NOTE) The Xpert Xpress SARS-CoV-2/FLU/RSV plus assay is intended as an aid in the diagnosis of influenza from Nasopharyngeal swab specimens and should not be used as a sole basis for treatment. Nasal washings and aspirates are unacceptable for Xpert Xpress SARS-CoV-2/FLU/RSV testing.  Fact Sheet for Patients: BloggerCourse.com  Fact Sheet for Healthcare Providers: SeriousBroker.it  This test is not yet approved or cleared by the [place] FDA and has been authorized for detection and/or diagnosis of SARS-CoV-2 by FDA under an Emergency Use Authorization (EUA). This EUA will remain in effect (meaning this test can be used) for the duration of  the COVID-19 declaration under Section 564(b)(1) of the Act, 21 U.S.C. section 360bbb-3(b)(1), unless the authorization is terminated or revoked.  Performed at [facility], [address], [place], [place] [address]      Radiology Studies: DG Chest 2 View  Result Date: 12/29/2020 CLINICAL DATA:  75 year old male with chest pain. EXAM: CHEST - 2 VIEW COMPARISON:  Chest radiograph dated 11/07/2020. FINDINGS: No focal consolidation, pleural effusion or pneumothorax. The cardiac silhouette is within limits. Atherosclerotic calcification of the aorta. Degenerative changes of the spine. No acute osseous pathology. IMPRESSION: No active cardiopulmonary disease. Electronically Signed   By: Elgie Collard M.D.   On: 12/29/2020 19:59   MR BRAIN WO CONTRAST  Result Date: 12/30/2020 CLINICAL DATA:  Initial evaluation for neuro deficit, stroke suspected. EXAM: MRI HEAD WITHOUT CONTRAST TECHNIQUE: Multiplanar, multiecho pulse sequences of the brain and surrounding structures were obtained without intravenous contrast. COMPARISON:  Prior CTs from earlier the same day. FINDINGS: Brain: Examination is somewhat technically limited as a coronal DWI sequence was not performed. Generalized age-related cerebral atrophy. Patchy T2/FLAIR hyperintensity seen within the periventricular and deep white matter both cerebral hemispheres as well as the pons, most consistent with chronic small vessel ischemic disease. No abnormal foci of restricted diffusion to suggest acute or subacute ischemia. Gray-white matter differentiation maintained. No encephalomalacia to suggest chronic cortical infarction. No evidence for acute or chronic intracranial hemorrhage. No mass lesion, midline shift or mass effect. No hydrocephalus or extra-axial fluid collection. Pituitary gland suprasellar region within normal limits. Midline structures intact. Vascular: Major intracranial vascular flow voids are maintained. Skull and upper cervical  spine: Craniocervical junction within normal limits. Bone marrow signal intensity normal. No scalp soft tissue abnormality. Sinuses/Orbits: Globes and orbital soft tissues within normal limits. Paranasal sinuses are largely clear. No significant mastoid effusion. Other: None. IMPRESSION: 1. No acute intracranial abnormality. 2. Generalized age-related cerebral atrophy with mild to moderate cerebral white matter disease, most likely related to chronic microvascular ischemic disease. Electronically Signed   By: Rise Mu M.D.   On: 12/30/2020 01:12   CT HEAD CODE STROKE WO CONTRAST  Result Date: 12/29/2020 CLINICAL DATA:  Code stroke.  Left-sided weakness. EXAM: CT HEAD WITHOUT CONTRAST TECHNIQUE: Contiguous axial images were obtained from the base of the skull through the vertex without intravenous contrast. COMPARISON:  Head MRI 03/26/2014 FINDINGS: Brain: There is no evidence of an acute infarct, intracranial hemorrhage, mass, midline shift, or extra-axial fluid collection. The ventricles and sulci are within normal limits for age. Hypodensities in the cerebral white matter bilaterally are nonspecific but compatible with mild chronic small vessel ischemic disease. Vascular: Calcified atherosclerosis at the skull base. No hyperdense vessel. Skull: No acute fracture or suspicious osseous lesion. Sinuses/Orbits: Depression of the left lamina papyracea which may reflect an old medial orbital fracture. No evidence of acute inflammatory sinus disease. Clear mastoid air cells. Other: None. ASPECTS [facility] Stroke Program Early CT Score) - Ganglionic level infarction (caudate, lentiform nuclei, internal capsule, insula, M1-M3 cortex): 7 - Supraganglionic infarction (M4-M6 cortex): 3 Total score (0-10 with 10 being normal): 10 IMPRESSION: 1. No evidence of acute intracranial abnormality. 2. ASPECTS is 10. 3. Mild chronic small vessel ischemic disease. These results were communicated to Dr. Wilford Corner at 6:43 pm on  12/29/2020 by text page via the [facility] messaging system. Electronically Signed   By: Sebastian Ache M.D.   On: 12/29/2020 18:43   CT ANGIO HEAD CODE STROKE  Result Date: 12/29/2020 CLINICAL DATA:  Left-sided weakness.  Slurred speech. EXAM: CT ANGIOGRAPHY HEAD AND NECK TECHNIQUE: Multidetector CT imaging of the head and neck was performed using the standard protocol during bolus administration of intravenous contrast. Multiplanar CT image reconstructions and MIPs were obtained to evaluate the vascular anatomy. Carotid stenosis measurements (when applicable) are obtained utilizing NASCET criteria, using the distal internal carotid diameter as the denominator. CONTRAST:  65mL OMNIPAQUE IOHEXOL 350 MG/ML SOLN COMPARISON:  None. FINDINGS: CTA NECK FINDINGS Aortic arch: Standard 3 vessel aortic arch with mild atherosclerotic plaque. No significant arch vessel origin stenosis. Right carotid system: Patent with mild calcified plaque at the carotid bifurcation and in the proximal ICA resulting in less than 50% ICA stenosis. Left carotid system: Patent with moderate calcified and soft plaque at the carotid bifurcation and in the proximal ICA resulting in 50% proximal ICA stenosis. Vertebral arteries: Patent with calcified plaque at both vertebral artery origins. No evidence of significant stenosis or dissection. Mildly dominant right vertebral artery. Skeleton: Widespread severe cervical facet arthrosis. Mild cervical disc degeneration. Other neck: Bilateral posterior cervical muscular atrophy. Upper chest: Mild centrilobular and paraseptal emphysema. Review of the MIP images confirms the above findings CTA HEAD FINDINGS Anterior circulation: The internal carotid arteries are patent from skull base to carotid termini with atherosclerotic plaque resulting in mild paraclinoid stenosis bilaterally. ACAs and MCAs are patent without evidence of a proximal branch occlusion or significant proximal stenosis. No aneurysm is identified.  Posterior circulation: The intracranial vertebral arteries are patent to the basilar with mild atherosclerotic irregularity bilaterally but no significant stenosis. Patent PICA and SCA origins are seen bilaterally. The basilar artery is widely patent. Posterior communicating arteries are diminutive or absent. Both PCAs are patent without evidence of a significant proximal stenosis. There is a severe right P3 branch vessel stenosis. No aneurysm is identified. Venous sinuses: Not well evaluated due to arterial contrast timing. Anatomic variants: None. Review of the MIP images confirms the above findings IMPRESSION: 1. No large vessel occlusion. 2. Intracranial and cervical atherosclerosis resulting in mild bilateral intracranial ICA stenoses and 60% proximal left ICA stenosis. 3.  Aortic Atherosclerosis (ICD10-I70.0). These results were communicated to Dr. Wilford Corner at 6:43 pm on 12/29/2020 by text page via the [facility] messaging system. Electronically Signed   By: Sebastian Ache M.D.   On: 12/29/2020 19:15   CT ANGIO NECK CODE STROKE  Result Date: 12/29/2020 CLINICAL DATA:  Left-sided weakness.  Slurred speech. EXAM: CT ANGIOGRAPHY HEAD AND NECK TECHNIQUE: Multidetector CT imaging of the head and neck was performed using the standard protocol during bolus administration of intravenous contrast. Multiplanar CT image reconstructions and MIPs were obtained to evaluate the vascular anatomy. Carotid stenosis measurements (when applicable) are obtained utilizing NASCET criteria, using the distal internal carotid diameter as the denominator. CONTRAST:  12mL OMNIPAQUE IOHEXOL 350 MG/ML SOLN COMPARISON:  None. FINDINGS: CTA NECK FINDINGS Aortic arch: Standard 3 vessel aortic arch with mild atherosclerotic plaque. No significant arch vessel origin stenosis. Right carotid system: Patent with mild calcified plaque at the carotid bifurcation and in the proximal ICA resulting in less than 50% ICA stenosis. Left carotid system: Patent with  moderate calcified and soft plaque at the carotid bifurcation and in the proximal ICA resulting in 50% proximal ICA stenosis. Vertebral arteries: Patent with calcified plaque at both vertebral artery origins. No evidence of significant stenosis or dissection. Mildly dominant right vertebral artery. Skeleton: Widespread severe cervical facet arthrosis. Mild cervical disc degeneration. Other neck: Bilateral posterior cervical muscular atrophy. Upper chest: Mild centrilobular and paraseptal emphysema. Review of the MIP images confirms the above findings CTA HEAD FINDINGS Anterior circulation: The internal carotid arteries are patent from skull base to carotid termini with atherosclerotic plaque resulting in mild paraclinoid stenosis bilaterally. ACAs and MCAs are patent without evidence of a proximal branch occlusion or significant proximal stenosis. No aneurysm is identified. Posterior circulation: The intracranial vertebral arteries  are patent to the basilar with mild atherosclerotic irregularity bilaterally but no significant stenosis. Patent PICA and SCA origins are seen bilaterally. The basilar artery is widely patent. Posterior communicating arteries are diminutive or absent. Both PCAs are patent without evidence of a significant proximal stenosis. There is a severe right P3 branch vessel stenosis. No aneurysm is identified. Venous sinuses: Not well evaluated due to arterial contrast timing. Anatomic variants: None. Review of the MIP images confirms the above findings IMPRESSION: 1. No large vessel occlusion. 2. Intracranial and cervical atherosclerosis resulting in mild bilateral intracranial ICA stenoses and 60% proximal left ICA stenosis. 3.  Aortic Atherosclerosis (ICD10-I70.0). These results were communicated to Dr. Wilford CornerArora at 6:43 pm on 12/29/2020 by text page via the [facility] messaging system. Electronically Signed   By: Sebastian AcheAllen  Grady M.D.   On: 12/29/2020 19:15     LOS: 1 day   Lanae Boastamesh Nyair Depaulo, MD [facility]  12/31/2020, 8:09 AM

## 2020-12-31 NOTE — H&P (View-Only) (Signed)
Cardiology Progress Note  Patient ID: JEANPAUL BIEHL MRN: [id number] DOB: August 28, 1946 Date of Encounter: 12/31/2020  Primary Cardiologist: Parke Poisson, MD  Subjective   Chief Complaint: None.  HPI: No further chest pain.  N.p.o. for left heart catheterization.  ROS:  All other ROS reviewed and negative. Pertinent positives noted in the HPI.     Inpatient Medications  Scheduled Meds: . amLODipine  10 mg Oral Daily  . atorvastatin  40 mg Oral Daily  . isosorbide mononitrate  30 mg Oral Daily  . metoprolol succinate  25 mg Oral Daily  . nicotine  21 mg Transdermal Daily  . sodium chloride flush  3 mL Intravenous Once  . sodium chloride flush  3 mL Intravenous Q12H   Continuous Infusions: . sodium chloride 75 mL/hr at 12/30/20 1523  . sodium chloride    . sodium chloride 1 mL/kg/hr (12/31/20 0443)  . heparin 1,450 Units/hr (12/30/20 1833)   PRN Meds: sodium chloride, acetaminophen, nitroGLYCERIN, ondansetron (ZOFRAN) IV, sodium chloride flush   Vital Signs   Vitals:   12/30/20 1943 12/31/20 0024 12/31/20 0540 12/31/20 0852  BP: 135/78 109/60 135/71 133/76  Pulse: 64 (!) 55 60 (!) 56  Resp: 19 17 18 18   Temp: 98.3 F (36.8 C)  98.3 F (36.8 C) 97.9 F (36.6 C)  TempSrc: Oral  Oral Oral  SpO2: 92% 97% 92% 95%  Weight:   87.3 kg   Height:        Intake/Output Summary (Last 24 hours) at 12/31/2020 1023 Last data filed at 12/31/2020 0840 Gross per 24 hour  Intake 2783.75 ml  Output 2600 ml  Net 183.75 ml   Last 3 Weights 12/31/2020 12/30/2020 12/29/2020  Weight (lbs) 192 lb 7.4 oz 191 lb 11.2 oz 199 lb 11.8 oz  Weight (kg) 87.3 kg 86.955 kg 90.6 kg      Telemetry  Overnight telemetry shows sinus bradycardia heart rate in the 50s, which I personally reviewed.    Physical Exam   Vitals:   12/30/20 1943 12/31/20 0024 12/31/20 0540 12/31/20 0852  BP: 135/78 109/60 135/71 133/76  Pulse: 64 (!) 55 60 (!) 56  Resp: 19 17 18 18   Temp: 98.3 F (36.8 C)  98.3 F  (36.8 C) 97.9 F (36.6 C)  TempSrc: Oral  Oral Oral  SpO2: 92% 97% 92% 95%  Weight:   87.3 kg   Height:         Intake/Output Summary (Last 24 hours) at 12/31/2020 1023 Last data filed at 12/31/2020 0840 Gross per 24 hour  Intake 2783.75 ml  Output 2600 ml  Net 183.75 ml    Last 3 Weights 12/31/2020 12/30/2020 12/29/2020  Weight (lbs) 192 lb 7.4 oz 191 lb 11.2 oz 199 lb 11.8 oz  Weight (kg) 87.3 kg 86.955 kg 90.6 kg    Body mass index is 28.01 kg/m.  General: Well nourished, well developed, in no acute distress Head: Atraumatic, normal size  Eyes: PEERLA, EOMI  Neck: Supple, no JVD Endocrine: No thryomegaly Cardiac: Normal S1, S2; RRR; no murmurs, rubs, or gallops Lungs: Clear to auscultation bilaterally, no wheezing, rhonchi or rales  Abd: Soft, nontender, no hepatomegaly  Ext: No edema, pulses 2+ Musculoskeletal: No deformities, BUE and BLE strength normal and equal Skin: Warm and dry, no rashes   Neuro: Alert and oriented to person, place, time, and situation, CNII-XII grossly intact, no focal deficits  Psych: Normal mood and affect   Labs  High Sensitivity Troponin:  Recent Labs  Lab 12/29/20 1832 12/29/20 2105 12/30/20 0127 12/30/20 0322  TROPONINIHS 124* 176* 131* 135*     Cardiac EnzymesNo results for input(s): TROPONINI in the last 168 hours. No results for input(s): TROPIPOC in the last 168 hours.  Chemistry Recent Labs  Lab 12/29/20 1825 12/30/20 0127 12/31/20 0446  NA 134*  135  --  136  K 3.7  3.7  --  3.9  CL 100  99  --  102  CO2 26  --  29  GLUCOSE 157*  155*  --  95  BUN 17  20  --  8  CREATININE 0.97  0.90 0.66 0.62  CALCIUM 9.1  --  8.6*  PROT 7.3  --   --   ALBUMIN 4.2  --   --   AST 20  --   --   ALT 18  --   --   ALKPHOS 85  --   --   BILITOT 0.8  --   --   GFRNONAA >60 >60 >60  ANIONGAP 8  --  5    Hematology Recent Labs  Lab 12/29/20 1825 12/30/20 0127 12/31/20 0317  WBC 8.5 8.6 6.4  RBC 4.70 4.42 4.29  HGB 14.1  14.6  13.5 13.0  HCT 43.1  43.0 40.3 39.0  MCV 91.7 91.2 90.9  MCH 30.0 30.5 30.3  MCHC 32.7 33.5 33.3  RDW 12.8 12.9 12.8  PLT 201 182 168   BNPNo results for input(s): BNP, PROBNP in the last 168 hours.  DDimer No results for input(s): DDIMER in the last 168 hours.   Radiology  DG Chest 2 View  Result Date: 12/29/2020 CLINICAL DATA:  75 year old male with chest pain. EXAM: CHEST - 2 VIEW COMPARISON:  Chest radiograph dated 11/07/2020. FINDINGS: No focal consolidation, pleural effusion or pneumothorax. The cardiac silhouette is within limits. Atherosclerotic calcification of the aorta. Degenerative changes of the spine. No acute osseous pathology. IMPRESSION: No active cardiopulmonary disease. Electronically Signed   By: Elgie Collard M.D.   On: 12/29/2020 19:59   MR BRAIN WO CONTRAST  Result Date: 12/30/2020 CLINICAL DATA:  Initial evaluation for neuro deficit, stroke suspected. EXAM: MRI HEAD WITHOUT CONTRAST TECHNIQUE: Multiplanar, multiecho pulse sequences of the brain and surrounding structures were obtained without intravenous contrast. COMPARISON:  Prior CTs from earlier the same day. FINDINGS: Brain: Examination is somewhat technically limited as a coronal DWI sequence was not performed. Generalized age-related cerebral atrophy. Patchy T2/FLAIR hyperintensity seen within the periventricular and deep white matter both cerebral hemispheres as well as the pons, most consistent with chronic small vessel ischemic disease. No abnormal foci of restricted diffusion to suggest acute or subacute ischemia. Gray-white matter differentiation maintained. No encephalomalacia to suggest chronic cortical infarction. No evidence for acute or chronic intracranial hemorrhage. No mass lesion, midline shift or mass effect. No hydrocephalus or extra-axial fluid collection. Pituitary gland suprasellar region within normal limits. Midline structures intact. Vascular: Major intracranial vascular flow voids are  maintained. Skull and upper cervical spine: Craniocervical junction within normal limits. Bone marrow signal intensity normal. No scalp soft tissue abnormality. Sinuses/Orbits: Globes and orbital soft tissues within normal limits. Paranasal sinuses are largely clear. No significant mastoid effusion. Other: None. IMPRESSION: 1. No acute intracranial abnormality. 2. Generalized age-related cerebral atrophy with mild to moderate cerebral white matter disease, most likely related to chronic microvascular ischemic disease. Electronically Signed   By: Rise Mu M.D.   On: 12/30/2020 01:12   CT HEAD CODE  STROKE WO CONTRAST  Result Date: 12/29/2020 CLINICAL DATA:  Code stroke.  Left-sided weakness. EXAM: CT HEAD WITHOUT CONTRAST TECHNIQUE: Contiguous axial images were obtained from the base of the skull through the vertex without intravenous contrast. COMPARISON:  Head MRI 03/26/2014 FINDINGS: Brain: There is no evidence of an acute infarct, intracranial hemorrhage, mass, midline shift, or extra-axial fluid collection. The ventricles and sulci are within normal limits for age. Hypodensities in the cerebral white matter bilaterally are nonspecific but compatible with mild chronic small vessel ischemic disease. Vascular: Calcified atherosclerosis at the skull base. No hyperdense vessel. Skull: No acute fracture or suspicious osseous lesion. Sinuses/Orbits: Depression of the left lamina papyracea which may reflect an old medial orbital fracture. No evidence of acute inflammatory sinus disease. Clear mastoid air cells. Other: None. ASPECTS (Alberta Stroke Program Early CT Score) - Ganglionic level infarction (caudate, lentiform nuclei, internal capsule, insula, M1-M3 cortex): 7 - Supraganglionic infarction (M4-M6 cortex): 3 Total score (0-10 with 10 being normal): 10 IMPRESSION: 1. No evidence of acute intracranial abnormality. 2. ASPECTS is 10. 3. Mild chronic small vessel ischemic disease. These results were  communicated to Dr. Arora at 6:43 pm on 12/29/2020 by text page via the [employer] messaging system. Electronically Signed   By: Allen  Grady M.D.   On: 12/29/2020 18:43   CT ANGIO HEAD CODE STROKE  Result Date: 12/29/2020 CLINICAL DATA:  Left-sided weakness.  Slurred speech. EXAM: CT ANGIOGRAPHY HEAD AND NECK TECHNIQUE: Multidetector CT imaging of the head and neck was performed using the standard protocol during bolus administration of intravenous contrast. Multiplanar CT image reconstructions and MIPs were obtained to evaluate the vascular anatomy. Carotid stenosis measurements (when applicable) are obtained utilizing NASCET criteria, using the distal internal carotid diameter as the denominator. CONTRAST:  75mL OMNIPAQUE IOHEXOL 350 MG/ML SOLN COMPARISON:  None. FINDINGS: CTA NECK FINDINGS Aortic arch: Standard 3 vessel aortic arch with mild atherosclerotic plaque. No significant arch vessel origin stenosis. Right carotid system: Patent with mild calcified plaque at the carotid bifurcation and in the proximal ICA resulting in less than 50% ICA stenosis. Left carotid system: Patent with moderate calcified and soft plaque at the carotid bifurcation and in the proximal ICA resulting in 50% proximal ICA stenosis. Vertebral arteries: Patent with calcified plaque at both vertebral artery origins. No evidence of significant stenosis or dissection. Mildly dominant right vertebral artery. Skeleton: Widespread severe cervical facet arthrosis. Mild cervical disc degeneration. Other neck: Bilateral posterior cervical muscular atrophy. Upper chest: Mild centrilobular and paraseptal emphysema. Review of the MIP images confirms the above findings CTA HEAD FINDINGS Anterior circulation: The internal carotid arteries are patent from skull base to carotid termini with atherosclerotic plaque resulting in mild paraclinoid stenosis bilaterally. ACAs and MCAs are patent without evidence of a proximal branch occlusion or significant  proximal stenosis. No aneurysm is identified. Posterior circulation: The intracranial vertebral arteries are patent to the basilar with mild atherosclerotic irregularity bilaterally but no significant stenosis. Patent PICA and SCA origins are seen bilaterally. The basilar artery is widely patent. Posterior communicating arteries are diminutive or absent. Both PCAs are patent without evidence of a significant proximal stenosis. There is a severe right P3 branch vessel stenosis. No aneurysm is identified. Venous sinuses: Not well evaluated due to arterial contrast timing. Anatomic variants: None. Review of the MIP images confirms the above findings IMPRESSION: 1. No large vessel occlusion. 2. Intracranial and cervical atherosclerosis resulting in mild bilateral intracranial ICA stenoses and 60% proximal left ICA stenosis. 3.  Aortic   Atherosclerosis (ICD10-I70.0). These results were communicated to Dr. Wilford Corner at 6:43 pm on 12/29/2020 by text page via the [facility] messaging system. Electronically Signed   By: Sebastian Ache M.D.   On: 12/29/2020 19:15   CT ANGIO NECK CODE STROKE  Result Date: 12/29/2020 CLINICAL DATA:  Left-sided weakness.  Slurred speech. EXAM: CT ANGIOGRAPHY HEAD AND NECK TECHNIQUE: Multidetector CT imaging of the head and neck was performed using the standard protocol during bolus administration of intravenous contrast. Multiplanar CT image reconstructions and MIPs were obtained to evaluate the vascular anatomy. Carotid stenosis measurements (when applicable) are obtained utilizing NASCET criteria, using the distal internal carotid diameter as the denominator. CONTRAST:  51mL OMNIPAQUE IOHEXOL 350 MG/ML SOLN COMPARISON:  None. FINDINGS: CTA NECK FINDINGS Aortic arch: Standard 3 vessel aortic arch with mild atherosclerotic plaque. No significant arch vessel origin stenosis. Right carotid system: Patent with mild calcified plaque at the carotid bifurcation and in the proximal ICA resulting in less than 50%  ICA stenosis. Left carotid system: Patent with moderate calcified and soft plaque at the carotid bifurcation and in the proximal ICA resulting in 50% proximal ICA stenosis. Vertebral arteries: Patent with calcified plaque at both vertebral artery origins. No evidence of significant stenosis or dissection. Mildly dominant right vertebral artery. Skeleton: Widespread severe cervical facet arthrosis. Mild cervical disc degeneration. Other neck: Bilateral posterior cervical muscular atrophy. Upper chest: Mild centrilobular and paraseptal emphysema. Review of the MIP images confirms the above findings CTA HEAD FINDINGS Anterior circulation: The internal carotid arteries are patent from skull base to carotid termini with atherosclerotic plaque resulting in mild paraclinoid stenosis bilaterally. ACAs and MCAs are patent without evidence of a proximal branch occlusion or significant proximal stenosis. No aneurysm is identified. Posterior circulation: The intracranial vertebral arteries are patent to the basilar with mild atherosclerotic irregularity bilaterally but no significant stenosis. Patent PICA and SCA origins are seen bilaterally. The basilar artery is widely patent. Posterior communicating arteries are diminutive or absent. Both PCAs are patent without evidence of a significant proximal stenosis. There is a severe right P3 branch vessel stenosis. No aneurysm is identified. Venous sinuses: Not well evaluated due to arterial contrast timing. Anatomic variants: None. Review of the MIP images confirms the above findings IMPRESSION: 1. No large vessel occlusion. 2. Intracranial and cervical atherosclerosis resulting in mild bilateral intracranial ICA stenoses and 60% proximal left ICA stenosis. 3.  Aortic Atherosclerosis (ICD10-I70.0). These results were communicated to Dr. Wilford Corner at 6:43 pm on 12/29/2020 by text page via the [facility] messaging system. Electronically Signed   By: Sebastian Ache M.D.   On: 12/29/2020 19:15     Cardiac Studies  Limited echo: EF 60-65% with no regional wall motion abnormalities on my review  Patient Profile  TARA WICH is a 75 y.o. male with diffuse CAD, COPD, hypertension who was admitted on 12/29/2020 with acute left-sided chest pain and weakness.  Cardiology consulted for elevated troponin.  Assessment & Plan   1.  Non-STEMI versus demand ischemia -History of extensive CAD based on coronary CTA. -Admitted with persistent sharp chest pain.  Troponins mildly elevated.  EKG with old anteroseptal infarct which is unchanged from prior. -His symptoms occurred with heavy exertion.  Troponins were elevated.  Plans for left heart catheterization today. -Continue aspirin heparin drip.  I fear he may have three-vessel CAD. -Echocardiogram on my review shows normal LV function with no wall motion abnormalities. -We will continue home antianginal agents for now.  Continue high intensity statin. -Further  recommendations pending left heart catheterization.  For questions or updates, please contact [facility] Please consult [email or web address] for contact info under   Time Spent with Patient: I have spent a total of 25 minutes with patient reviewing hospital notes, telemetry, EKGs, labs and examining the patient as well as establishing an assessment and plan that was discussed with the patient.  > 50% of time was spent in direct patient care.    Signed, Lenna Gilford. Flora Lipps, MD, [facility]  [facility]  12/31/2020 10:23 AM

## 2020-12-31 NOTE — Progress Notes (Deleted)
CARDIAC REHAB PHASE I   Stent education completed with pt. Pt educated on importance of ASA and Plavix. Pt given heart healthy and diabetic diets. Reviewed site care, restrictions, and exercise guidelines. Stressed importance of safety as pt states he has had falls at home. Will refer to CRP II [place].  [id number] Reynold Bowen, RN BSN 12/31/2020 1:38 PM

## 2020-12-31 NOTE — Progress Notes (Signed)
*  PRELIMINARY RESULTS* Echocardiogram 2D Echocardiogram has been performed.  Jacob Ruiz 12/31/2020, 9:49 AM

## 2020-12-31 NOTE — Progress Notes (Signed)
Cardiology Progress Note  Patient ID: Jacob Ruiz MRN: [id number] DOB: August 28, 1946 Date of Encounter: 12/31/2020  Primary Cardiologist: Parke Poisson, MD  Subjective   Chief Complaint: None.  HPI: No further chest pain.  N.p.o. for left heart catheterization.  ROS:  All other ROS reviewed and negative. Pertinent positives noted in the HPI.     Inpatient Medications  Scheduled Meds: . amLODipine  10 mg Oral Daily  . atorvastatin  40 mg Oral Daily  . isosorbide mononitrate  30 mg Oral Daily  . metoprolol succinate  25 mg Oral Daily  . nicotine  21 mg Transdermal Daily  . sodium chloride flush  3 mL Intravenous Once  . sodium chloride flush  3 mL Intravenous Q12H   Continuous Infusions: . sodium chloride 75 mL/hr at 12/30/20 1523  . sodium chloride    . sodium chloride 1 mL/kg/hr (12/31/20 0443)  . heparin 1,450 Units/hr (12/30/20 1833)   PRN Meds: sodium chloride, acetaminophen, nitroGLYCERIN, ondansetron (ZOFRAN) IV, sodium chloride flush   Vital Signs   Vitals:   12/30/20 1943 12/31/20 0024 12/31/20 0540 12/31/20 0852  BP: 135/78 109/60 135/71 133/76  Pulse: 64 (!) 55 60 (!) 56  Resp: 19 17 18 18   Temp: 98.3 F (36.8 C)  98.3 F (36.8 C) 97.9 F (36.6 C)  TempSrc: Oral  Oral Oral  SpO2: 92% 97% 92% 95%  Weight:   87.3 kg   Height:        Intake/Output Summary (Last 24 hours) at 12/31/2020 1023 Last data filed at 12/31/2020 0840 Gross per 24 hour  Intake 2783.75 ml  Output 2600 ml  Net 183.75 ml   Last 3 Weights 12/31/2020 12/30/2020 12/29/2020  Weight (lbs) 192 lb 7.4 oz 191 lb 11.2 oz 199 lb 11.8 oz  Weight (kg) 87.3 kg 86.955 kg 90.6 kg      Telemetry  Overnight telemetry shows sinus bradycardia heart rate in the 50s, which I personally reviewed.    Physical Exam   Vitals:   12/30/20 1943 12/31/20 0024 12/31/20 0540 12/31/20 0852  BP: 135/78 109/60 135/71 133/76  Pulse: 64 (!) 55 60 (!) 56  Resp: 19 17 18 18   Temp: 98.3 F (36.8 C)  98.3 F  (36.8 C) 97.9 F (36.6 C)  TempSrc: Oral  Oral Oral  SpO2: 92% 97% 92% 95%  Weight:   87.3 kg   Height:         Intake/Output Summary (Last 24 hours) at 12/31/2020 1023 Last data filed at 12/31/2020 0840 Gross per 24 hour  Intake 2783.75 ml  Output 2600 ml  Net 183.75 ml    Last 3 Weights 12/31/2020 12/30/2020 12/29/2020  Weight (lbs) 192 lb 7.4 oz 191 lb 11.2 oz 199 lb 11.8 oz  Weight (kg) 87.3 kg 86.955 kg 90.6 kg    Body mass index is 28.01 kg/m.  General: Well nourished, well developed, in no acute distress Head: Atraumatic, normal size  Eyes: PEERLA, EOMI  Neck: Supple, no JVD Endocrine: No thryomegaly Cardiac: Normal S1, S2; RRR; no murmurs, rubs, or gallops Lungs: Clear to auscultation bilaterally, no wheezing, rhonchi or rales  Abd: Soft, nontender, no hepatomegaly  Ext: No edema, pulses 2+ Musculoskeletal: No deformities, BUE and BLE strength normal and equal Skin: Warm and dry, no rashes   Neuro: Alert and oriented to person, place, time, and situation, CNII-XII grossly intact, no focal deficits  Psych: Normal mood and affect   Labs  High Sensitivity Troponin:  Recent Labs  Lab 12/29/20 1832 12/29/20 2105 12/30/20 0127 12/30/20 0322  TROPONINIHS 124* 176* 131* 135*     Cardiac EnzymesNo results for input(s): TROPONINI in the last 168 hours. No results for input(s): TROPIPOC in the last 168 hours.  Chemistry Recent Labs  Lab 12/29/20 1825 12/30/20 0127 12/31/20 0446  NA 134*  135  --  136  K 3.7  3.7  --  3.9  CL 100  99  --  102  CO2 26  --  29  GLUCOSE 157*  155*  --  95  BUN 17  20  --  8  CREATININE 0.97  0.90 0.66 0.62  CALCIUM 9.1  --  8.6*  PROT 7.3  --   --   ALBUMIN 4.2  --   --   AST 20  --   --   ALT 18  --   --   ALKPHOS 85  --   --   BILITOT 0.8  --   --   GFRNONAA >60 >60 >60  ANIONGAP 8  --  5    Hematology Recent Labs  Lab 12/29/20 1825 12/30/20 0127 12/31/20 0317  WBC 8.5 8.6 6.4  RBC 4.70 4.42 4.29  HGB 14.1  14.6  13.5 13.0  HCT 43.1  43.0 40.3 39.0  MCV 91.7 91.2 90.9  MCH 30.0 30.5 30.3  MCHC 32.7 33.5 33.3  RDW 12.8 12.9 12.8  PLT 201 182 168   BNPNo results for input(s): BNP, PROBNP in the last 168 hours.  DDimer No results for input(s): DDIMER in the last 168 hours.   Radiology  DG Chest 2 View  Result Date: 12/29/2020 CLINICAL DATA:  75 year old male with chest pain. EXAM: CHEST - 2 VIEW COMPARISON:  Chest radiograph dated 11/07/2020. FINDINGS: No focal consolidation, pleural effusion or pneumothorax. The cardiac silhouette is within limits. Atherosclerotic calcification of the aorta. Degenerative changes of the spine. No acute osseous pathology. IMPRESSION: No active cardiopulmonary disease. Electronically Signed   By: Elgie Collard M.D.   On: 12/29/2020 19:59   MR BRAIN WO CONTRAST  Result Date: 12/30/2020 CLINICAL DATA:  Initial evaluation for neuro deficit, stroke suspected. EXAM: MRI HEAD WITHOUT CONTRAST TECHNIQUE: Multiplanar, multiecho pulse sequences of the brain and surrounding structures were obtained without intravenous contrast. COMPARISON:  Prior CTs from earlier the same day. FINDINGS: Brain: Examination is somewhat technically limited as a coronal DWI sequence was not performed. Generalized age-related cerebral atrophy. Patchy T2/FLAIR hyperintensity seen within the periventricular and deep white matter both cerebral hemispheres as well as the pons, most consistent with chronic small vessel ischemic disease. No abnormal foci of restricted diffusion to suggest acute or subacute ischemia. Gray-white matter differentiation maintained. No encephalomalacia to suggest chronic cortical infarction. No evidence for acute or chronic intracranial hemorrhage. No mass lesion, midline shift or mass effect. No hydrocephalus or extra-axial fluid collection. Pituitary gland suprasellar region within normal limits. Midline structures intact. Vascular: Major intracranial vascular flow voids are  maintained. Skull and upper cervical spine: Craniocervical junction within normal limits. Bone marrow signal intensity normal. No scalp soft tissue abnormality. Sinuses/Orbits: Globes and orbital soft tissues within normal limits. Paranasal sinuses are largely clear. No significant mastoid effusion. Other: None. IMPRESSION: 1. No acute intracranial abnormality. 2. Generalized age-related cerebral atrophy with mild to moderate cerebral white matter disease, most likely related to chronic microvascular ischemic disease. Electronically Signed   By: Rise Mu M.D.   On: 12/30/2020 01:12   CT HEAD CODE  STROKE WO CONTRAST  Result Date: 12/29/2020 CLINICAL DATA:  Code stroke.  Left-sided weakness. EXAM: CT HEAD WITHOUT CONTRAST TECHNIQUE: Contiguous axial images were obtained from the base of the skull through the vertex without intravenous contrast. COMPARISON:  Head MRI 03/26/2014 FINDINGS: Brain: There is no evidence of an acute infarct, intracranial hemorrhage, mass, midline shift, or extra-axial fluid collection. The ventricles and sulci are within normal limits for age. Hypodensities in the cerebral white matter bilaterally are nonspecific but compatible with mild chronic small vessel ischemic disease. Vascular: Calcified atherosclerosis at the skull base. No hyperdense vessel. Skull: No acute fracture or suspicious osseous lesion. Sinuses/Orbits: Depression of the left lamina papyracea which may reflect an old medial orbital fracture. No evidence of acute inflammatory sinus disease. Clear mastoid air cells. Other: None. ASPECTS [facility]([place] Stroke Program Early CT Score) - Ganglionic level infarction (caudate, lentiform nuclei, internal capsule, insula, M1-M3 cortex): 7 - Supraganglionic infarction (M4-M6 cortex): 3 Total score (0-10 with 10 being normal): 10 IMPRESSION: 1. No evidence of acute intracranial abnormality. 2. ASPECTS is 10. 3. Mild chronic small vessel ischemic disease. These results were  communicated to Dr. Wilford CornerArora at 6:43 pm on 12/29/2020 by text page via the [facility] messaging system. Electronically Signed   By: Sebastian AcheAllen  Grady M.D.   On: 12/29/2020 18:43   CT ANGIO HEAD CODE STROKE  Result Date: 12/29/2020 CLINICAL DATA:  Left-sided weakness.  Slurred speech. EXAM: CT ANGIOGRAPHY HEAD AND NECK TECHNIQUE: Multidetector CT imaging of the head and neck was performed using the standard protocol during bolus administration of intravenous contrast. Multiplanar CT image reconstructions and MIPs were obtained to evaluate the vascular anatomy. Carotid stenosis measurements (when applicable) are obtained utilizing NASCET criteria, using the distal internal carotid diameter as the denominator. CONTRAST:  75mL OMNIPAQUE IOHEXOL 350 MG/ML SOLN COMPARISON:  None. FINDINGS: CTA NECK FINDINGS Aortic arch: Standard 3 vessel aortic arch with mild atherosclerotic plaque. No significant arch vessel origin stenosis. Right carotid system: Patent with mild calcified plaque at the carotid bifurcation and in the proximal ICA resulting in less than 50% ICA stenosis. Left carotid system: Patent with moderate calcified and soft plaque at the carotid bifurcation and in the proximal ICA resulting in 50% proximal ICA stenosis. Vertebral arteries: Patent with calcified plaque at both vertebral artery origins. No evidence of significant stenosis or dissection. Mildly dominant right vertebral artery. Skeleton: Widespread severe cervical facet arthrosis. Mild cervical disc degeneration. Other neck: Bilateral posterior cervical muscular atrophy. Upper chest: Mild centrilobular and paraseptal emphysema. Review of the MIP images confirms the above findings CTA HEAD FINDINGS Anterior circulation: The internal carotid arteries are patent from skull base to carotid termini with atherosclerotic plaque resulting in mild paraclinoid stenosis bilaterally. ACAs and MCAs are patent without evidence of a proximal branch occlusion or significant  proximal stenosis. No aneurysm is identified. Posterior circulation: The intracranial vertebral arteries are patent to the basilar with mild atherosclerotic irregularity bilaterally but no significant stenosis. Patent PICA and SCA origins are seen bilaterally. The basilar artery is widely patent. Posterior communicating arteries are diminutive or absent. Both PCAs are patent without evidence of a significant proximal stenosis. There is a severe right P3 branch vessel stenosis. No aneurysm is identified. Venous sinuses: Not well evaluated due to arterial contrast timing. Anatomic variants: None. Review of the MIP images confirms the above findings IMPRESSION: 1. No large vessel occlusion. 2. Intracranial and cervical atherosclerosis resulting in mild bilateral intracranial ICA stenoses and 60% proximal left ICA stenosis. 3.  Aortic  Atherosclerosis (ICD10-I70.0). These results were communicated to Dr. Wilford Corner at 6:43 pm on 12/29/2020 by text page via the [facility] messaging system. Electronically Signed   By: Sebastian Ache M.D.   On: 12/29/2020 19:15   CT ANGIO NECK CODE STROKE  Result Date: 12/29/2020 CLINICAL DATA:  Left-sided weakness.  Slurred speech. EXAM: CT ANGIOGRAPHY HEAD AND NECK TECHNIQUE: Multidetector CT imaging of the head and neck was performed using the standard protocol during bolus administration of intravenous contrast. Multiplanar CT image reconstructions and MIPs were obtained to evaluate the vascular anatomy. Carotid stenosis measurements (when applicable) are obtained utilizing NASCET criteria, using the distal internal carotid diameter as the denominator. CONTRAST:  51mL OMNIPAQUE IOHEXOL 350 MG/ML SOLN COMPARISON:  None. FINDINGS: CTA NECK FINDINGS Aortic arch: Standard 3 vessel aortic arch with mild atherosclerotic plaque. No significant arch vessel origin stenosis. Right carotid system: Patent with mild calcified plaque at the carotid bifurcation and in the proximal ICA resulting in less than 50%  ICA stenosis. Left carotid system: Patent with moderate calcified and soft plaque at the carotid bifurcation and in the proximal ICA resulting in 50% proximal ICA stenosis. Vertebral arteries: Patent with calcified plaque at both vertebral artery origins. No evidence of significant stenosis or dissection. Mildly dominant right vertebral artery. Skeleton: Widespread severe cervical facet arthrosis. Mild cervical disc degeneration. Other neck: Bilateral posterior cervical muscular atrophy. Upper chest: Mild centrilobular and paraseptal emphysema. Review of the MIP images confirms the above findings CTA HEAD FINDINGS Anterior circulation: The internal carotid arteries are patent from skull base to carotid termini with atherosclerotic plaque resulting in mild paraclinoid stenosis bilaterally. ACAs and MCAs are patent without evidence of a proximal branch occlusion or significant proximal stenosis. No aneurysm is identified. Posterior circulation: The intracranial vertebral arteries are patent to the basilar with mild atherosclerotic irregularity bilaterally but no significant stenosis. Patent PICA and SCA origins are seen bilaterally. The basilar artery is widely patent. Posterior communicating arteries are diminutive or absent. Both PCAs are patent without evidence of a significant proximal stenosis. There is a severe right P3 branch vessel stenosis. No aneurysm is identified. Venous sinuses: Not well evaluated due to arterial contrast timing. Anatomic variants: None. Review of the MIP images confirms the above findings IMPRESSION: 1. No large vessel occlusion. 2. Intracranial and cervical atherosclerosis resulting in mild bilateral intracranial ICA stenoses and 60% proximal left ICA stenosis. 3.  Aortic Atherosclerosis (ICD10-I70.0). These results were communicated to Dr. Wilford Corner at 6:43 pm on 12/29/2020 by text page via the [facility] messaging system. Electronically Signed   By: Sebastian Ache M.D.   On: 12/29/2020 19:15     Cardiac Studies  Limited echo: EF 60-65% with no regional wall motion abnormalities on my review  Patient Profile  Jacob Ruiz is a 75 y.o. male with diffuse CAD, COPD, hypertension who was admitted on 12/29/2020 with acute left-sided chest pain and weakness.  Cardiology consulted for elevated troponin.  Assessment & Plan   1.  Non-STEMI versus demand ischemia -History of extensive CAD based on coronary CTA. -Admitted with persistent sharp chest pain.  Troponins mildly elevated.  EKG with old anteroseptal infarct which is unchanged from prior. -His symptoms occurred with heavy exertion.  Troponins were elevated.  Plans for left heart catheterization today. -Continue aspirin heparin drip.  I fear he may have three-vessel CAD. -Echocardiogram on my review shows normal LV function with no wall motion abnormalities. -We will continue home antianginal agents for now.  Continue high intensity statin. -Further  recommendations pending left heart catheterization.  For questions or updates, please contact [facility] Please consult [email or web address] for contact info under   Time Spent with Patient: I have spent a total of 25 minutes with patient reviewing hospital notes, telemetry, EKGs, labs and examining the patient as well as establishing an assessment and plan that was discussed with the patient.  > 50% of time was spent in direct patient care.    Signed, Lenna Gilford. Flora Lipps, MD, [facility]  [facility]  12/31/2020 10:23 AM

## 2020-12-31 NOTE — Interval H&P Note (Signed)
History and Physical Interval Note:  12/31/2020 10:54 AM  Jacob Ruiz  has presented today for surgery, with the diagnosis of NSTEMI.  The various methods of treatment have been discussed with the patient and family. After consideration of risks, benefits and other options for treatment, the patient has consented to  Procedure(s): LEFT HEART CATH AND CORONARY ANGIOGRAPHY (N/A) as a surgical intervention.  The patient's history has been reviewed, patient examined, no change in status, stable for surgery.  I have reviewed the patient's chart and labs.  Questions were answered to the patient's satisfaction.    Cath Lab Visit (complete for each Cath Lab visit)  Clinical Evaluation Leading to the Procedure:   ACS: Yes.    Non-ACS:  N/A  Kenna Seward

## 2020-12-31 NOTE — Brief Op Note (Signed)
BRIEF CARDIAC CATHETERIZATION NOTE  DATE: 12/31/2020  TIME: 12:31 PM  PATIENT:  Jacob Ruiz  75 y.o. male  PRE-OPERATIVE DIAGNOSIS:  NSTEMI  POST-OPERATIVE DIAGNOSIS:  NSTEMI  PROCEDURE:  Procedure(s): LEFT HEART CATH AND CORONARY ANGIOGRAPHY (N/A) CORONARY STENT INTERVENTION (N/A)  SURGEON:  Surgeon(s) and Role:    * Bradden Tadros, Cristal Deer, MD - Primary  FINDINGS: 1. Severe 2-vessel CAD with sequential 99% proximal and 70% mid LCx lesions as well as 80-90% ostial rPDA stenosis.  Mild to moderate diffuse LAD disease is also present. 2. Normal LVEDP. 3. Successful PCI to proximal and mid LCx using non-overlapping Resolute Onyx 2.25 x 15 mm (post-dilated to 2.6 mm) and 2.0 x 12 mm (post-dilated to 2.2 mm) drug-eluting stents with 0% residual stenosis and TIMI-3 flow.  RECOMMENDATIONS: 1. DAPT with aspirin and ticagrelor for at least 12 months. 2. Escalate antianginal therapy as needed.  If patient has refractory angina, PCI to ostial rPDA could be considered. 3. Aggressive secondary prevention.  Yvonne Kendall, MD [facility]

## 2020-12-31 NOTE — Progress Notes (Signed)
ANTICOAGULATION CONSULT NOTE  Pharmacy Consult for heparin Indication: chest pain/ACS  Heparin Dosing Weight: 90 kg  Labs: Recent Labs    12/29/20 1825 12/29/20 1832 12/29/20 2105 12/30/20 0127 12/30/20 0322 12/30/20 1652 12/31/20 0317 12/31/20 0446  HGB 14.1  14.6  --   --  13.5  --   --  13.0  --   HCT 43.1  43.0  --   --  40.3  --   --  39.0  --   PLT 201  --   --  182  --   --  168  --   APTT 30  --   --   --   --   --   --   --   LABPROT 13.2  --   --   --   --   --   --   --   INR 1.0  --   --   --   --   --   --   --   HEPARINUNFRC  --   --   --   --   --  0.13* 0.30  --   CREATININE 0.97  0.90  --   --  0.66  --   --   --  0.62  TROPONINIHS  --    < > 176* 131* 135*  --   --   --    < > = values in this interval not displayed.    Estimated Creatinine Clearance: 89.5 mL/min (by C-G formula based on SCr of 0.62 mg/dL).  Assessment: 63 yom presenting with left-sided weakness as code stroke. Patient also with CP, elevated high-sensitivity troponin. Pharmacy consulted to dose heparin. Patient is not on anticoagulation PTA.   Code stroke called in ED and MRI was negative for stroke, and no further neurology work up planned. Plans for cath today -heparin level at the low end of goal  Goal of Therapy:  Heparin level 0.3-0.7 units/ml Monitor platelets by anticoagulation protocol: Yes   Plan:  -continue heparin at 1450 units/hr -Will follow plans post cath  Harland German, PharmD Clinical Pharmacist **Pharmacist phone directory can now be found on [email or web address] ([email or web address]).  Listed under [facility].

## 2021-01-01 LAB — BASIC METABOLIC PANEL
Anion gap: 10 (ref 5–15)
BUN: 9 mg/dL (ref 8–23)
CO2: 25 mmol/L (ref 22–32)
Calcium: 9 mg/dL (ref 8.9–10.3)
Chloride: 101 mmol/L (ref 98–111)
Creatinine, Ser: 0.58 mg/dL — ABNORMAL LOW (ref 0.61–1.24)
GFR, Estimated: >60 mL/min (ref >60)
Glucose, Bld: 92 mg/dL (ref 70–99)
Potassium: 3.7 mmol/L (ref 3.5–5.1)
Sodium: 136 mmol/L (ref 135–145)

## 2021-01-01 LAB — CBC
HCT: 42.1 % (ref 39.0–52.0)
Hemoglobin: 14.2 g/dL (ref 13.0–17.0)
MCH: 30.1 pg (ref 26.0–34.0)
MCHC: 33.7 g/dL (ref 30.0–36.0)
MCV: 89.4 fL (ref 80.0–100.0)
Platelets: 173 10*3/uL (ref 150–400)
RBC: 4.71 MIL/uL (ref 4.22–5.81)
RDW: 12.6 % (ref 11.5–15.5)
WBC: 7.8 10*3/uL (ref 4.0–10.5)
nRBC: 0 % (ref 0.0–0.2)

## 2021-01-01 MED ORDER — TICAGRELOR 90 MG PO TABS: 90.0000 mg | ORAL_TABLET | Freq: Two times a day (BID) | ORAL | 0 refills | Status: DC

## 2021-01-01 MED ORDER — ATORVASTATIN CALCIUM 40 MG PO TABS: 40.0000 mg | ORAL_TABLET | Freq: Every day | ORAL | 0 refills | Status: DC

## 2021-01-01 MED ORDER — ASPIRIN 81 MG PO CHEW: 81.0000 mg | CHEWABLE_TABLET | Freq: Every day | ORAL | 0 refills | Status: AC

## 2021-01-01 NOTE — Plan of Care (Signed)
  Problem: Education: Goal: Knowledge of General Education information will improve Description: Including pain rating scale, medication(s)/side effects and non-pharmacologic comfort measures Outcome: Progressing   Problem: Clinical Measurements: Goal: Ability to maintain clinical measurements within normal limits will improve Outcome: Progressing Goal: Will remain free from infection Outcome: Progressing Goal: Diagnostic test results will improve Outcome: Progressing Goal: Cardiovascular complication will be avoided Outcome: Progressing   Problem: Nutrition: Goal: Adequate nutrition will be maintained Outcome: Progressing   Problem: Coping: Goal: Level of anxiety will decrease Outcome: Progressing   Problem: Elimination: Goal: Will not experience complications related to bowel motility Outcome: Progressing Goal: Will not experience complications related to urinary retention Outcome: Progressing   Problem: Pain Managment: Goal: General experience of comfort will improve Outcome: Progressing   Problem: Safety: Goal: Ability to remain free from injury will improve Outcome: Progressing   Problem: Skin Integrity: Goal: Risk for impaired skin integrity will decrease Outcome: Progressing   Problem: Education: Goal: Understanding of CV disease, CV risk reduction, and recovery process will improve Outcome: Progressing   Problem: Activity: Goal: Ability to return to baseline activity level will improve Outcome: Progressing   Problem: Cardiovascular: Goal: Ability to achieve and maintain adequate cardiovascular perfusion will improve Outcome: Progressing Goal: Vascular access site(s) Level 0-1 will be maintained Outcome: Progressing   Problem: Health Behavior/Discharge Planning: Goal: Ability to safely manage health-related needs after discharge will improve Outcome: Progressing

## 2021-01-01 NOTE — Discharge Instructions (Signed)
Call [facility] at [phone] if any bleeding, swelling or drainage at cath site.  May shower, no tub baths for 48 hours for groin sticks. No lifting over 5 pounds for 5 days.  No Driving for 5 days    General, Healthful Nutrition Therapy If you are interested in a following a general, healthful diet or if your registered dietitian nutritionist (RDN) or doctor has recommended it to you, this guide can help provide you with the basic knowledge you'll need. The general healthful diet can be tailored to your personal preferences. There are several benefits to following a general, healthful diet: . Depending on your food choices, it could mean less calories, less salt, less added sugars, and less saturated fat and trans fat than many other diets. . When you focus on eating more whole grains, legumes, fruits, vegetables, nuts, and seeds, you may improve how much fiber, vitamins, and minerals you eat.  . It can lower your risk of conditions like diabetes, heart disease, hypertension, stroke, and cancer.   Tips . Eat at least 5 servings of fruits and vegetables every day. o Don't focus only on green vegetables. There are special health benefits to eating blue-purple, yellow, orange, and red vegetables. . Eat more legumes (like beans and lentils) and more whole grains. . Try meatless alternatives. o In place of meat, you can get your protein from eating eggs, fish, poultry, beans, peas, soy-based foods, and nuts/nut butters o Low-fat or fat-free dairy products are also good sources of protein. Marland Kitchen Keep your salt intake to a minimum (less than 2300 milligrams per day). o Avoid adding salt, soy sauce or fish sauce to your food when cooking. o Eat freshly prepared meals at home. Processed foods and restaurant foods contain more salt. o Fresh fruits and vegetables are the best choices for snacks. o When shopping, choose the products with lower sodium content. . Limit your daily  sugar intake. o Sugar can be found in honey, syrups, jelly, fruit juice, and fruit juice concentrate. o Limit sugar-sweetened beverages like soda pop and fruit juice, sugary snacks, and candy o It's best to avoid products with added sugar, but if you do eat them, read labels carefully so you know how much sugar is in each portion. . It is better to eat unsaturated fats than saturated fats. Avoid trans fats as much as possible. o Unsaturated fat is found in fish, avocado, nuts, and oils like sunflower, canola, and olive oils. o Saturated fat is found in fatty meat, butter, ice cream, palm and coconut oil, cream, cheese, and lard. o Trans fats are found in many processed foods, margarines, fried foods, fast food items, convenience foods like frozen pizza and snack foods, and sweets including pies, cookies, and other pastries. Check nutrition labels. o When cooking, use vegetable oil instead of animal oil. o Boil, steam, or bake your food instead of frying. o If you eat meat, remove the fatty part before cooking. Foods Recommended Include a variety of the following whole foods. Choose a healthful balance of foods from each category at your meals. Be sure the meals don't exceed your recommended calorie limit so you can achieve and/or maintain a healthy weight. Food Group Foods Recommended  Grains Choose whole grains for at least half of grain selections, including whole wheat, barley, rye, buckwheat, corn, teff, quinoa, millet, amaranth, brown and wild rice, sorghum, and oats Focus on intact cooked whole grains Choose grain products, such as bread, rolls, prepared  breakfast cereals, crackers, and pasta made from whole grains that are low in added sugars, saturated fat, and sodium  Protein Foods Fresh or frozen red meat, including lean, trimmed cuts of beef, pork, or lamb a few times per week or less; avoid processed meats, such as bacon, sausage, and ham Fresh or frozen poultry, including skinless  chicken or [place], avoid processed meats that are higher in sodium Fresh, frozen, or canned seafood, including fish, shrimp, lobster, clams, and scallops at least twice per week. Focus on fatty fish, such as salmon, herring, and sardines, as a rich source of omega-3 fatty acids, and limit those which have a greater risk for contamination, including king mackerel, shark, and tilefish Eggs Nuts and seeds, such as peanuts, almonds, pistachios, and sunflower seeds (unsalted varieties) Nut and seed butters, such as peanut butter, almond butter, and sunflower seed butter, (reduced-sodium varieties)   Soy foods, such as tofu, tempeh, or soy nuts Meat alternatives, such as veggie burgers, and sausages based on plant protein (reduced-sodium varieties) Unsalted legumes, such as dried beans, lentils, or peas at least a few times per week in place of other protein sources  Dairy Low-fat or fat-free milk, yogurt (low in added sugars), cottage cheese, and cheeses Frozen desserts made from low-fat milk that are low in added sugars (no more than 5 grams added sugars per serving) Fortified soymilk  Vegetables A variety of fresh, frozen, and canned (unsalted) whole vegetables, including dark-green, red and orange vegetables, legumes (beans and peas), and starchy vegetables; low-sodium vegetable juices  Fruits A variety of fresh, frozen, canned and dried, whole unsweetened fruits canned fruit packed in water or fruit juice without added sugar) 100% fruit juice (limited to one serving per day)  Oils and Fats Use in moderation, up to 5 servings per day: Unsaturated vegetable oils, including olive, peanut, and canola oils Margarines and spreads, which list liquid vegetable oil as the first ingredient and do not contain trans fats (partially hydrogenated oil) Salad dressing and mayonnaise made from unsaturated vegetable oils  Beverages Coffee, tea (unsweetened), water, 100% fruit juice (limited to one serving per  day) Avoid sweetened beverages, including soda, sweetened tea, sports drinks, energy drinks, and coffee drinks  Other Prepared foods, including soups, casseroles, salads, baked goods, and snacks made from recommended ingredients, with low levels of added saturated fat, added sugars, or salt  Foods Not Recommended The following foods should be included occasionally, if at all. Food Group Foods Not Recommended  Grains Sweetened, low-fiber breakfast cereals (less than 2 grams of fiber per serving) Packaged (high sugar, refined ingredients) baked goods Snack crackers and chips made of refined ingredients, cheese crackers, butter crackers Breads made with refined ingredients and saturated fats, such as biscuits, frozen waffles, sweet breads, doughnuts, pastries, packaged baking mixes, pancakes, cakes, and cookies  Protein Foods Marbled or fatty red meats (beef, pork, lamb), such as ribs Processed red meats, such as bacon, sausage, and ham Poultry (chicken and [place]) with skin Fried meats, poultry, or fish Deli meats, such as pastrami, bologna, or salami (made of meat or poultry) Fried eggs Salted legumes, nuts, seeds, or nut/seed butters Meat alternatives with high levels of sodium or saturated fat  Dairy Whole milk, cream, cheeses made from whole milk, sour cream Yogurt or ice cream made from whole milk or with added sugar Cream cheese made from whole milk  Vegetables Canned or frozen vegetables with salt, fresh vegetables prepared with salt Fried vegetables Vegetables in cream sauce or cheese sauce  Tomato or pasta sauce with high levels of salt or sugar  Fruits Fruits packed in syrup or made with added sugar  Oils Solid shortening or partially hydrogenated oils Solid margarine made with hydrogenated or partially hydrogenated oils Margarine that contains trans fats; butter  Beverages Sweetened drinks, including sweetened coffee or tea drinks, soda, energy drinks, and sports drinks  Alcohol  (for adults >23 years of age) If you choose to drink, women should have no more than one drink per day and men should have no more than two per day (One drink is measured as 5 ounces wine; 12 ounces beer, 1.5 ounces spirits.)  Other Sugary and/or fatty desserts, candy, and other sweets; salt and seasonings that contain salt Fried foods  General, Healthful Diet Sample 1-Day Menu   Breakfast 1 cup oatmeal 1/2 cup blueberries 1 ounce almonds 1 cup low-fat or fat-free milk 1 cup coffee  Lunch 2 slices whole wheat bread 3 ounces [place] slices 1/4 cup lettuce for sandwich 2 slices tomato for sandwich 1 ounce reduced-fat, reduced sodium cheese 1/2 cup fresh carrot sticks 1/4 cup hummus 1 banana 1 cup milk 1 cup unsweetened tea  Evening Meal 4 ounces baked salmon with basil 1 cup quinoa 1 cup green beans 1 cup mixed greens salad 1 teaspoon olive oil mixed with vinegar of choice 1 whole wheat dinner roll 1 teaspoon margarine (for roll) 1/2 cup applesauce 1 cup water  Evening Snack 1 cup low-fat yogurt 1/2 cup sliced peaches   Roslyn Smiling, MS, RD, LDN Clinical Dietitian Office phone [phone]

## 2021-01-01 NOTE — Progress Notes (Signed)
Progress Note  Patient Name: Jacob Ruiz Date of Encounter: 01/01/2021  Primary Cardiologist: Parke Poisson, MD  Subjective   Walking in halls with rehab ready for d/c   Inpatient Medications    Scheduled Meds: . amLODipine  10 mg Oral Daily  . aspirin  81 mg Oral Daily  . atorvastatin  40 mg Oral Daily  . enoxaparin (LOVENOX) injection  40 mg Subcutaneous Q24H  . feeding supplement  237 mL Oral BID BM  . isosorbide mononitrate  30 mg Oral Daily  . metoprolol succinate  25 mg Oral Daily  . nicotine  21 mg Transdermal Daily  . sodium chloride flush  3 mL Intravenous Once  . sodium chloride flush  3 mL Intravenous Q12H  . sodium chloride flush  3 mL Intravenous Q12H  . ticagrelor  90 mg Oral BID   Continuous Infusions: . sodium chloride     PRN Meds: sodium chloride, acetaminophen, nitroGLYCERIN, ondansetron (ZOFRAN) IV, sodium chloride flush   Vital Signs    Vitals:   12/31/20 1615 12/31/20 2043 01/01/21 0026 01/01/21 0503  BP: (!) 145/76 125/68 135/80 (!) 143/87  Pulse: (!) 55 80 66 61  Resp: 16 18 18 19   Temp: 97.7 F (36.5 C) 98 F (36.7 C)  98.7 F (37.1 C)  TempSrc: Oral Oral  Oral  SpO2: 98% 100% 95% 95%  Weight:    87.5 kg  Height:        Intake/Output Summary (Last 24 hours) at 01/01/2021 0738 Last data filed at 01/01/2021 0057 Gross per 24 hour  Intake 2620.21 ml  Output 1850 ml  Net 770.21 ml   Filed Weights   12/30/20 1450 12/31/20 0540 01/01/21 0503  Weight: 87 kg 87.3 kg 87.5 kg    Physical Exam   General: Well developed, well nourished, NAD Skin: Warm, dry, intact  Head: Normocephalic, atraumatic, sclera non-icteric, no xanthomas, clear, moist mucus membranes. Neck: Negative for carotid bruits. No JVD Lungs:Clear to ausculation bilaterally. No wheezes, rales, or rhonchi. Breathing is unlabored. Cardiovascular: RRR with S1 S2. No murmurs, rubs, gallops, or LV heave appreciated. Abdomen: Soft, non-tender, non-distended with  normoactive bowel sounds. No hepatomegaly, No rebound/guarding. No obvious abdominal masses. MSK: Strength and tone appear normal for age. 5/5 in all extremities Extremities: No edema. No clubbing or cyanosis. DP/PT pulses 2+ bilaterally Neuro: Alert and oriented. No focal deficits. No facial asymmetry. MAE spontaneously. Psych: Responds to questions appropriately with normal affect.   Right radial cath site A   Labs    Chemistry Recent Labs  Lab 12/29/20 1825 12/30/20 0127 12/31/20 0446 01/01/21 0220  NA 134*  135  --  136 136  K 3.7  3.7  --  3.9 3.7  CL 100  99  --  102 101  CO2 26  --  29 25  GLUCOSE 157*  155*  --  95 92  BUN 17  20  --  8 9  CREATININE 0.97  0.90 0.66 0.62 0.58*  CALCIUM 9.1  --  8.6* 9.0  PROT 7.3  --   --   --   ALBUMIN 4.2  --   --   --   AST 20  --   --   --   ALT 18  --   --   --   ALKPHOS 85  --   --   --   BILITOT 0.8  --   --   --   GFRNONAA >  60 >60 >60 >60  ANIONGAP 8  --  5 10     Hematology Recent Labs  Lab 12/30/20 0127 12/31/20 0317 01/01/21 0220  WBC 8.6 6.4 7.8  RBC 4.42 4.29 4.71  HGB 13.5 13.0 14.2  HCT 40.3 39.0 42.1  MCV 91.2 90.9 89.4  MCH 30.5 30.3 30.1  MCHC 33.5 33.3 33.7  RDW 12.9 12.8 12.6  PLT 182 168 173    Cardiac EnzymesNo results for input(s): TROPONINI in the last 168 hours. No results for input(s): TROPIPOC in the last 168 hours.   BNPNo results for input(s): BNP, PROBNP in the last 168 hours.   DDimer No results for input(s): DDIMER in the last 168 hours.   Radiology    CARDIAC CATHETERIZATION  Result Date: 12/31/2020 Conclusions: 1. Significant two-vessel coronary artery disease, including sequential 99% proximal/mid and 70% mid/distal LCx lesions as well as 80-90% ostial rPDA stenosis.  Moderate diffuse disease noted in the LAD. 2. Normal left ventricular filling pressure. 3. Successful PCI to LCx using non-overlapping Resolute Onyx 2.25 x 15 mm (proximal/mid, post-dilated to 2.5 mm) and 2.0 x  12 mm (mid/distal, post-dilated to 2.2 mm) drug-eluting stents with 0% residual stenosis and TIMI-3 flow. Recommendations: 1. Dual antiplatelet therapy with aspirin and ticagrelor for at least 12 months. 2. Aggressive secondary prevention.  If the patient has persistent angina refractory to aggressive medical therapy, PCI to ostial rPDA could be considered. Yvonne Kendall, MD [facility]   ECHOCARDIOGRAM LIMITED  Result Date: 12/31/2020    ECHOCARDIOGRAM LIMITED REPORT   Patient Name:   Jacob Ruiz Date of Exam: 12/31/2020 Medical Rec #:  [id number]       Height:       69.5 in Accession #:    [id number]      Weight:       192.5 lb Date of Birth:  22-Oct-1945       BSA:          2.043 m Patient Age:    74 years        BP:           133/76 mmHg Patient Gender: M               HR:           56 bpm. Exam Location:  Inpatient Procedure: 2D Echo Indications:    NSTEMI I21.4  History:        Patient has prior history of Echocardiogram examinations, most                 recent 11/08/2020. Acute MI, TIA, Signs/Symptoms:Chest Pain; Risk                 Factors:Current Smoker. Bradycardia.  Sonographer:    Jeryl Columbia Referring Phys: [id number] Ronnald Ramp O'NEAL IMPRESSIONS  1. Left ventricular ejection fraction, by estimation, is 60 to 65%. The left ventricle has normal function. The left ventricle has no regional wall motion abnormalities. There is mild left ventricular hypertrophy. Left ventricular diastolic parameters are indeterminate.  2. Right ventricular systolic function is normal. The right ventricular size is mildly enlarged. There is mildly elevated pulmonary artery systolic pressure. The estimated right ventricular systolic pressure is 38.8 mmHg.  3. The mitral valve is normal in structure. No evidence of mitral stenosis.  4. The aortic valve was not well visualized. Aortic valve regurgitation is not visualized. Mild aortic valve stenosis. MG , AVA 1.4 cm^2, DI 0.53  5. The inferior  vena cava is  normal in size with greater than 50% respiratory variability, suggesting right atrial pressure of 3 mmHg. FINDINGS  Left Ventricle: Left ventricular ejection fraction, by estimation, is 60 to 65%. The left ventricle has normal function. The left ventricle has no regional wall motion abnormalities. The left ventricular internal cavity size was normal in size. There is  mild left ventricular hypertrophy. Left ventricular diastolic parameters are indeterminate. Right Ventricle: The right ventricular size is mildly enlarged. Right vetricular wall thickness was not well visualized. Right ventricular systolic function is normal. There is mildly elevated pulmonary artery systolic pressure. The tricuspid regurgitant  velocity is 2.99 m/s, and with an assumed right atrial pressure of 3 mmHg, the estimated right ventricular systolic pressure is 38.8 mmHg. Left Atrium: Left atrial size was normal in size. Right Atrium: Right atrial size was normal in size. Pericardium: There is no evidence of pericardial effusion. Mitral Valve: The mitral valve is normal in structure. No evidence of mitral valve stenosis. Tricuspid Valve: The tricuspid valve is normal in structure. Tricuspid valve regurgitation is trivial. Aortic Valve: The aortic valve was not well visualized. Aortic valve regurgitation is not visualized. Mild aortic stenosis is present. Aortic valve mean gradient measures 7.7 mmHg. Aortic valve peak gradient measures 15.4 mmHg. Aortic valve area, by VTI measures 1.37 cm. Pulmonic Valve: The pulmonic valve was not well visualized. Pulmonic valve regurgitation is not visualized. Aorta: The aortic root is normal in size and structure. Venous: The inferior vena cava is normal in size with greater than 50% respiratory variability, suggesting right atrial pressure of 3 mmHg. IAS/Shunts: The interatrial septum was not well visualized. LEFT VENTRICLE PLAX 2D LVIDd:         4.40 cm  Diastology LVIDs:         3.50 cm  LV e' medial:     8.81 cm/s LV PW:         0.90 cm  LV E/e' medial:  7.5 LV IVS:        1.20 cm  LV e' lateral:   8.59 cm/s LVOT diam:     1.80 cm  LV E/e' lateral: 7.7 LV SV:         63 LV SV Index:   31 LVOT Area:     2.54 cm  RIGHT VENTRICLE RV S prime:     15.90 cm/s TAPSE (M-mode): 2.7 cm LEFT ATRIUM             Index       RIGHT ATRIUM           Index LA diam:        3.20 cm 1.57 cm/m  RA Area:     17.60 cm LA Vol (A2C):   32.5 ml 15.91 ml/m RA Volume:   51.60 ml  25.26 ml/m LA Vol (A4C):   39.5 ml 19.34 ml/m LA Biplane Vol: 36.0 ml 17.62 ml/m  AORTIC VALVE AV Area (Vmax):    1.29 cm AV Area (Vmean):   1.37 cm AV Area (VTI):     1.37 cm AV Vmax:           196.00 cm/s AV Vmean:          130.000 cm/s AV VTI:            0.458 m AV Peak Grad:      15.4 mmHg AV Mean Grad:      7.7 mmHg LVOT Vmax:  99.70 cm/s LVOT Vmean:        69.800 cm/s LVOT VTI:          0.246 m LVOT/AV VTI ratio: 0.54  AORTA Ao Root diam: 2.70 cm MITRAL VALVE               TRICUSPID VALVE MV Area (PHT): 2.24 cm    TR Peak grad:   35.8 mmHg MV Decel Time: 338 msec    TR Vmax:        299.00 cm/s MV E velocity: 66.00 cm/s MV A velocity: 56.10 cm/s  SHUNTS MV E/A ratio:  1.18        Systemic VTI:  0.25 m                            Systemic Diam: 1.80 cm Epifanio Lesches MD Electronically signed by Epifanio Lesches MD Signature Date/Time: 12/31/2020/11:06:07 AM    Final    Telemetry    NSR  - Personally Reviewed  ECG    No new tracing as of 01/01/21 - Personally Reviewed  Cardiac Studies   LHC 12/31/20:  Conclusions: 1. Significant two-vessel coronary artery disease, including sequential 99% proximal/mid and 70% mid/distal LCx lesions as well as 80-90% ostial rPDA stenosis.  Moderate diffuse disease noted in the LAD. 2. Normal left ventricular filling pressure. 3. Successful PCI to LCx using non-overlapping Resolute Onyx 2.25 x 15 mm (proximal/mid, post-dilated to 2.5 mm) and 2.0 x 12 mm (mid/distal, post-dilated to 2.2 mm)  drug-eluting stents with 0% residual stenosis and TIMI-3 flow.  Recommendations: 1. Dual antiplatelet therapy with aspirin and ticagrelor for at least 12 months. 2. Aggressive secondary prevention.  If the patient has persistent angina refractory to aggressive medical therapy, PCI to ostial rPDA could be considered.  Diagnostic Dominance: Right    Intervention     Echo 12/31/20:  1. Left ventricular ejection fraction, by estimation, is 60 to 65%. The  left ventricle has normal function. The left ventricle has no regional  wall motion abnormalities. There is mild left ventricular hypertrophy.  Left ventricular diastolic parameters  are indeterminate.  2. Right ventricular systolic function is normal. The right ventricular  size is mildly enlarged. There is mildly elevated pulmonary artery  systolic pressure. The estimated right ventricular systolic pressure is  38.8 mmHg.  3. The mitral valve is normal in structure. No evidence of mitral  stenosis.  4. The aortic valve was not well visualized. Aortic valve regurgitation  is not visualized. Mild aortic valve stenosis. MG , AVA 1.4 cm^2, DI  0.53  5. The inferior vena cava is normal in size with greater than 50%  respiratory variability, suggesting right atrial pressure of 3 mmHg.   Patient Profile     75 y.o. male with diffuse CAD, COPD, hypertension and tobacco use who was admitted on 12/29/2020 with acute left-sided chest pain and weakness. Cardiology consulted for elevated troponin.  Assessment & Plan    1. NSTEMI: -Pt with a hx of CAD per CTA who was admitted for persistent chest pain with elevated HsT  -LHC performed 12/31/20 that showed severe two vessel CAD with sequential 99% proximal and 70% mid LCx lesions as well as 80-90% ostial rPDA stenosis.  Mild to moderate diffuse LAD disease is also present>>>successful PCI to proximal and mid LCx using non-overlapping Resolute Onyx placed  -Recommendations for DAPT  with aspirin and ticagrelor for at least 12 month and escalate antianginal therapy  as needed.  -If patient has refractory angina, PCI to ostial rPDA could be considered. -Continue with aggressive secondary prevention  2. Tobacco use: -Cessation strongly encouraged   3. HTN: -Stable, 143/87>>135/80>>125/68 -Continue amlodipine, Toprol and Imdur   4. COPD: -Not a current issue -Continue inhalers   5. Left arm weakness: -s/p code stroke 12/29/20>>>MRI and CT negative>>>seen by neurology>>code stroke ruled out    Charlton Haws MD [facility]  For questions or updates, please contact   Please consult [email or web address] for contact info under Cardiology/STEMI.\

## 2021-01-01 NOTE — Progress Notes (Signed)
CARDIAC REHAB PHASE I   PRE:  Rate/Rhythm: 63 SR with PVC's  BP:  Supine:   Sitting: 149/78     SaO2: 92% RA  MODE:  Ambulation: 900 ft   POST:  Rate/Rhythm: 81 SR with PVC's  BP:  Supine:   Sitting: 172/86    SaO2: 95% RA  Pt was lying in bed and was independent with getting out of bed, putting pants, and shoes on. Pt ambulated 900 ft on RA with no complaints of CP. Pt did have mild shortness of breath and walked with a little limp due to knee pain/stiffness. Otherwise, pt tolerated ambulation well. Returned pt seated in chair. Discussed MI booklet, importance of Brilinta and aspirin, exercise guidelines, NTG use, smoking cessation, restrictions, diet, and CRP II. Pt was receptive. Pt states he is going to try to quit smoking when he gets home. Pt was given smoking cessation handout and phone number. Pt will be referred to CRP II [place].  [id number]   Norris Cross, MS, ACSM-CEP 01/01/2021 10:25 AM

## 2021-01-01 NOTE — Discharge Summary (Signed)
Physician Discharge Summary  STANELY CHAPEL HUD:[id number] DOB: Jun 14, 1946 DOA: 12/29/2020  PCP: Sigmund Hazel, MD  Admit date: 12/29/2020 Discharge date: 01/01/2021  Admitted From: Home Disposition: Home  Recommendations for Outpatient Follow-up:  1. Follow up with PCP and cardiology in 1-2 weeks 2. Please obtain BMP/CBC in one week 3. Please follow up on the following pending results:  Home Health: No Equipment/Devices: None  Discharge Condition: Stable Code Status:   Code Status: Full Code Diet recommendation:  Diet Order            Diet - low sodium heart healthy           Diet Heart Room service appropriate? Yes; Fluid consistency: Thin  Diet effective now                 Brief/Interim Summary: 74yom with hx of hypertension, GERD, tobacco abuse, osteoarthritis brought to the ED with complaint of sudden left-sided chest pain and diaphoresis and EMS felt patient was having left-sided weakness and left upper extremity peroneus longus.  Code stroke was activated.  Initial CT scan no acute CVA EKG showed no significant changes but had elevated troponin on blood work concerned about acute coronary syndrome.  Patient was seen by neurology MRI was done no acute finding cardiology was also consulted.  Patient has been subsequently hospitalized. In the ED on presentation-Temperature is 97.9 blood pressure 142/74, pulse 57 respirate of 23 oxygen sat 92% room air. CBC largely within normal. Sodium 134. Glucose 157. Initial troponin I 24-second troponin was 74. CT head and CT angio code stroke essentially negative His chest pain did not respond to nitroglycerin but resolved spontaneously. Patient was placed on heparin drip followed by cardiology s/p cardiac catheter 4/8- had PCI to proximal and mid LCx. Post-cath doing well.  This morning wants to go home.  Will be discharged once okay with cardiology. Discharge Diagnoses:   Elevated troponin/non-ST elevation MI versus demand  ischemia Extensive CAD based on coronary CTA: S/p cath- had PCI to proximal and mid LCx. Cont DAPT X 12 MONTHS, Imdur 30 mg metoprolol amlodipine and Lipitor 40 mg ( increased).TSH is normal, hemoglobin A1c 6.1 indicating prediabetes dietary education.lipid panel shows LDL is controlled at 73, HDL 59.  Tobacco abuse: Cessation advised.  Essential hypertension:  Controlled on amlodipine 10, metoprolol 25 and Imdur.  Left Arm weakness during chest pain 12/29/20/code stroke but MRI negative initial CT negative seen by neurology. Stroke ruled out.  COPD not in exacerbation.  Continue as needed inhalers  Overweight with BMI 29.  Will benefit with weight loss  Cardiac cath 1. Severe 2-vessel CAD with sequential 99% proximal and 70% mid LCx lesions as well as 80-90% ostial rPDA stenosis.  Mild to moderate diffuse LAD disease is also present. 2. Normal LVEDP. 3. Successful PCI to proximal and mid LCx using non-overlapping Resolute Onyx 2.25 x 15 mm (post-dilated to 2.6 mm) and 2.0 x 12 mm (post-dilated to 2.2 mm) drug-eluting stents with 0% residual stenosis and TIMI-3 flow.  RECOMMENDATIONS: 1. DAPT with aspirin and ticagrelor for at least 12 months. 2. Escalate antianginal therapy as needed.  If patient has refractory angina, PCI to ostial rPDA could be considered. 3. Aggressive secondary prevention.   Consults:  cardiology  Subjective: Aaox3, resting well, no complaints, wants to go home.  Discharge Exam: Vitals:   01/01/21 0503 01/01/21 0845  BP: (!) 143/87 (!) 176/92  Pulse: 61 65  Resp: 19 19  Temp: 98.7 F (37.1 C) 97.8  F (36.6 C)  SpO2: 95% 94%   General: Pt is alert, awake, not in acute distress Cardiovascular: RRR, S1/S2 +, no rubs, no gallops Respiratory: CTA bilaterally, no wheezing, no rhonchi Abdominal: Soft, NT, ND, bowel sounds + Extremities: no edema, no cyanosis  Discharge Instructions  Discharge Instructions    Amb Referral to Cardiac Rehabilitation    Complete by: As directed    Diagnosis:  NSTEMI Coronary Stents     After initial evaluation and assessments completed: Virtual Based Care may be provided alone or in conjunction with Phase 2 Cardiac Rehab based on patient barriers.: Yes   Diet - low sodium heart healthy   Complete by: As directed    Discharge instructions   Complete by: As directed    Please call call MD or return to ER for similar or worsening recurring problem that brought you to hospital or if any fever,nausea/vomiting,abdominal pain, uncontrolled pain, chest pain,  shortness of breath or any other alarming symptoms.  Please follow-up your doctor as instructed in a week time and call the office for appointment.  Please avoid alcohol, smoking, or any other illicit substance and maintain healthy habits including taking your regular medications as prescribed.  You were cared for by a hospitalist during your hospital stay. If you have any questions about your discharge medications or the care you received while you were in the hospital after you are discharged, you can call the unit and ask to speak with the hospitalist on call if the hospitalist that took care of you is not available.  Once you are discharged, your primary care physician will handle any further medical issues. Please note that NO REFILLS for any discharge medications will be authorized once you are discharged, as it is imperative that you return to your primary care physician (or establish a relationship with a primary care physician if you do not have one) for your aftercare needs so that they can reassess your need for medications and monitor your lab values   Increase activity slowly   Complete by: As directed      Allergies as of 01/01/2021      Reactions   Lisinopril Anaphylaxis   Ace Inhibitors    unknown   Aleve [naproxen Sodium] Rash      Medication List    TAKE these medications   acetaminophen 500 MG tablet Commonly known as:  TYLENOL Take 1,000 mg by mouth every 6 (six) hours as needed for mild pain.   amLODipine 10 MG tablet Commonly known as: NORVASC Take 1 tablet (10 mg total) by mouth daily.   aspirin 81 MG chewable tablet Chew 1 tablet (81 mg total) by mouth daily.   atorvastatin 40 MG tablet Commonly known as: LIPITOR Take 1 tablet (40 mg total) by mouth daily. What changed:   medication strength  how much to take   isosorbide mononitrate 30 MG 24 hr tablet Commonly known as: IMDUR Take 1 tablet (30 mg total) by mouth daily.   metoprolol succinate 25 MG 24 hr tablet Commonly known as: TOPROL-XL Take 1 tablet (25 mg total) by mouth daily.   nicotine 21 mg/24hr patch Commonly known as: NICODERM CQ - dosed in mg/24 hours Place 1 patch (21 mg total) onto the skin daily.   nitroGLYCERIN 0.4 MG SL tablet Commonly known as: NITROSTAT Place 1 tablet (0.4 mg total) under the tongue every 5 (five) minutes as needed for chest pain. YOU MAY TAKE (1) TABLET EVERY 5 MINS AS NEEDED  FOR CHEST PAIN. DO NOT EXCEED 3 DOSES.   ticagrelor 90 MG Tabs tablet Commonly known as: BRILINTA Take 1 tablet (90 mg total) by mouth 2 (two) times daily.       Follow-up Information    Sigmund Hazel, MD Follow up in 1 week(s).   Specialty: Family Medicine Contact information: [address] [place] [address] [phone]        Parke Poisson, MD .   Specialties: Cardiology, Radiology Contact information: [address] [place] [address] [phone]        Sande Rives, MD Follow up.   Specialties: Internal Medicine, Cardiology, Radiology Contact information: [address] [place] [address] [phone]              Allergies  Allergen Reactions  . Lisinopril Anaphylaxis  . Ace Inhibitors     unknown  . Aleve [Naproxen Sodium] Rash    The results of significant diagnostics from this hospitalization (including imaging, microbiology, ancillary  and laboratory) are listed below for reference.    Microbiology: Recent Results (from the past 240 hour(s))  Resp Panel by RT-PCR (Flu A&B, Covid) Nasopharyngeal Swab     Status: None   Collection Time: 12/29/20  7:51 PM   Specimen: Nasopharyngeal Swab; Nasopharyngeal(NP) swabs in vial transport medium  Result Value Ref Range Status   SARS Coronavirus 2 by RT PCR NEGATIVE NEGATIVE Final    Comment: (NOTE) SARS-CoV-2 target nucleic acids are NOT DETECTED.  The SARS-CoV-2 RNA is generally detectable in upper respiratory specimens during the acute phase of infection. The lowest concentration of SARS-CoV-2 viral copies this assay can detect is 138 copies/mL. A negative result does not preclude SARS-Cov-2 infection and should not be used as the sole basis for treatment or other patient management decisions. A negative result may occur with  improper specimen collection/handling, submission of specimen other than nasopharyngeal swab, presence of viral mutation(s) within the areas targeted by this assay, and inadequate number of viral copies(<138 copies/mL). A negative result must be combined with clinical observations, patient history, and epidemiological information. The expected result is Negative.  Fact Sheet for Patients:  BloggerCourse.com  Fact Sheet for Healthcare Providers:  SeriousBroker.it  This test is no t yet approved or cleared by the [place] FDA and  has been authorized for detection and/or diagnosis of SARS-CoV-2 by FDA under an Emergency Use Authorization (EUA). This EUA will remain  in effect (meaning this test can be used) for the duration of the COVID-19 declaration under Section 564(b)(1) of the Act, 21 U.S.C.section 360bbb-3(b)(1), unless the authorization is terminated  or revoked sooner.       Influenza A by PCR NEGATIVE NEGATIVE Final   Influenza B by PCR NEGATIVE NEGATIVE Final    Comment:  (NOTE) The Xpert Xpress SARS-CoV-2/FLU/RSV plus assay is intended as an aid in the diagnosis of influenza from Nasopharyngeal swab specimens and should not be used as a sole basis for treatment. Nasal washings and aspirates are unacceptable for Xpert Xpress SARS-CoV-2/FLU/RSV testing.  Fact Sheet for Patients: BloggerCourse.com  Fact Sheet for Healthcare Providers: SeriousBroker.it  This test is not yet approved or cleared by the [place] FDA and has been authorized for detection and/or diagnosis of SARS-CoV-2 by FDA under an Emergency Use Authorization (EUA). This EUA will remain in effect (meaning this test can be used) for the duration of the COVID-19 declaration under Section 564(b)(1) of the Act, 21 U.S.C. section 360bbb-3(b)(1), unless the authorization is  terminated or revoked.  Performed at [facility], [address] [place], [place] [address]     Procedures/Studies: DG Chest 2 View  Result Date: 12/29/2020 CLINICAL DATA:  75 year old male with chest pain. EXAM: CHEST - 2 VIEW COMPARISON:  Chest radiograph dated 11/07/2020. FINDINGS: No focal consolidation, pleural effusion or pneumothorax. The cardiac silhouette is within limits. Atherosclerotic calcification of the aorta. Degenerative changes of the spine. No acute osseous pathology. IMPRESSION: No active cardiopulmonary disease. Electronically Signed   By: Elgie Collard M.D.   On: 12/29/2020 19:59   MR BRAIN WO CONTRAST  Result Date: 12/30/2020 CLINICAL DATA:  Initial evaluation for neuro deficit, stroke suspected. EXAM: MRI HEAD WITHOUT CONTRAST TECHNIQUE: Multiplanar, multiecho pulse sequences of the brain and surrounding structures were obtained without intravenous contrast. COMPARISON:  Prior CTs from earlier the same day. FINDINGS: Brain: Examination is somewhat technically limited as a coronal DWI sequence was not performed. Generalized age-related  cerebral atrophy. Patchy T2/FLAIR hyperintensity seen within the periventricular and deep white matter both cerebral hemispheres as well as the pons, most consistent with chronic small vessel ischemic disease. No abnormal foci of restricted diffusion to suggest acute or subacute ischemia. Gray-white matter differentiation maintained. No encephalomalacia to suggest chronic cortical infarction. No evidence for acute or chronic intracranial hemorrhage. No mass lesion, midline shift or mass effect. No hydrocephalus or extra-axial fluid collection. Pituitary gland suprasellar region within normal limits. Midline structures intact. Vascular: Major intracranial vascular flow voids are maintained. Skull and upper cervical spine: Craniocervical junction within normal limits. Bone marrow signal intensity normal. No scalp soft tissue abnormality. Sinuses/Orbits: Globes and orbital soft tissues within normal limits. Paranasal sinuses are largely clear. No significant mastoid effusion. Other: None. IMPRESSION: 1. No acute intracranial abnormality. 2. Generalized age-related cerebral atrophy with mild to moderate cerebral white matter disease, most likely related to chronic microvascular ischemic disease. Electronically Signed   By: Rise Mu M.D.   On: 12/30/2020 01:12   CARDIAC CATHETERIZATION  Result Date: 12/31/2020 Conclusions: 1. Significant two-vessel coronary artery disease, including sequential 99% proximal/mid and 70% mid/distal LCx lesions as well as 80-90% ostial rPDA stenosis.  Moderate diffuse disease noted in the LAD. 2. Normal left ventricular filling pressure. 3. Successful PCI to LCx using non-overlapping Resolute Onyx 2.25 x 15 mm (proximal/mid, post-dilated to 2.5 mm) and 2.0 x 12 mm (mid/distal, post-dilated to 2.2 mm) drug-eluting stents with 0% residual stenosis and TIMI-3 flow. Recommendations: 1. Dual antiplatelet therapy with aspirin and ticagrelor for at least 12 months. 2. Aggressive  secondary prevention.  If the patient has persistent angina refractory to aggressive medical therapy, PCI to ostial rPDA could be considered. Yvonne Kendall, MD [facility]   CT HEAD CODE STROKE WO CONTRAST  Result Date: 12/29/2020 CLINICAL DATA:  Code stroke.  Left-sided weakness. EXAM: CT HEAD WITHOUT CONTRAST TECHNIQUE: Contiguous axial images were obtained from the base of the skull through the vertex without intravenous contrast. COMPARISON:  Head MRI 03/26/2014 FINDINGS: Brain: There is no evidence of an acute infarct, intracranial hemorrhage, mass, midline shift, or extra-axial fluid collection. The ventricles and sulci are within normal limits for age. Hypodensities in the cerebral white matter bilaterally are nonspecific but compatible with mild chronic small vessel ischemic disease. Vascular: Calcified atherosclerosis at the skull base. No hyperdense vessel. Skull: No acute fracture or suspicious osseous lesion. Sinuses/Orbits: Depression of the left lamina papyracea which may reflect an old medial orbital fracture. No evidence of acute inflammatory sinus disease. Clear mastoid air  cells. Other: None. ASPECTS [facility] Stroke Program Early CT Score) - Ganglionic level infarction (caudate, lentiform nuclei, internal capsule, insula, M1-M3 cortex): 7 - Supraganglionic infarction (M4-M6 cortex): 3 Total score (0-10 with 10 being normal): 10 IMPRESSION: 1. No evidence of acute intracranial abnormality. 2. ASPECTS is 10. 3. Mild chronic small vessel ischemic disease. These results were communicated to Dr. Wilford Corner at 6:43 pm on 12/29/2020 by text page via the [facility] messaging system. Electronically Signed   By: Sebastian Ache M.D.   On: 12/29/2020 18:43   ECHOCARDIOGRAM LIMITED  Result Date: 12/31/2020    ECHOCARDIOGRAM LIMITED REPORT   Patient Name:   DEMONTRAE GILBERT Date of Exam: 12/31/2020 Medical Rec #:  [id number]       Height:       69.5 in Accession #:    [id number]      Weight:       192.5 lb Date of  Birth:  Feb 08, 1946       BSA:          2.043 m Patient Age:    74 years        BP:           133/76 mmHg Patient Gender: M               HR:           56 bpm. Exam Location:  Inpatient Procedure: 2D Echo Indications:    NSTEMI I21.4  History:        Patient has prior history of Echocardiogram examinations, most                 recent 11/08/2020. Acute MI, TIA, Signs/Symptoms:Chest Pain; Risk                 Factors:Current Smoker. Bradycardia.  Sonographer:    Jeryl Columbia Referring Phys: [id number] Ronnald Ramp O'NEAL IMPRESSIONS  1. Left ventricular ejection fraction, by estimation, is 60 to 65%. The left ventricle has normal function. The left ventricle has no regional wall motion abnormalities. There is mild left ventricular hypertrophy. Left ventricular diastolic parameters are indeterminate.  2. Right ventricular systolic function is normal. The right ventricular size is mildly enlarged. There is mildly elevated pulmonary artery systolic pressure. The estimated right ventricular systolic pressure is 38.8 mmHg.  3. The mitral valve is normal in structure. No evidence of mitral stenosis.  4. The aortic valve was not well visualized. Aortic valve regurgitation is not visualized. Mild aortic valve stenosis. MG , AVA 1.4 cm^2, DI 0.53  5. The inferior vena cava is normal in size with greater than 50% respiratory variability, suggesting right atrial pressure of 3 mmHg. FINDINGS  Left Ventricle: Left ventricular ejection fraction, by estimation, is 60 to 65%. The left ventricle has normal function. The left ventricle has no regional wall motion abnormalities. The left ventricular internal cavity size was normal in size. There is  mild left ventricular hypertrophy. Left ventricular diastolic parameters are indeterminate. Right Ventricle: The right ventricular size is mildly enlarged. Right vetricular wall thickness was not well visualized. Right ventricular systolic function is normal. There is mildly elevated  pulmonary artery systolic pressure. The tricuspid regurgitant  velocity is 2.99 m/s, and with an assumed right atrial pressure of 3 mmHg, the estimated right ventricular systolic pressure is 38.8 mmHg. Left Atrium: Left atrial size was normal in size. Right Atrium: Right atrial size was normal in size. Pericardium: There is no evidence of pericardial effusion. Mitral Valve: The mitral  valve is normal in structure. No evidence of mitral valve stenosis. Tricuspid Valve: The tricuspid valve is normal in structure. Tricuspid valve regurgitation is trivial. Aortic Valve: The aortic valve was not well visualized. Aortic valve regurgitation is not visualized. Mild aortic stenosis is present. Aortic valve mean gradient measures 7.7 mmHg. Aortic valve peak gradient measures 15.4 mmHg. Aortic valve area, by VTI measures 1.37 cm. Pulmonic Valve: The pulmonic valve was not well visualized. Pulmonic valve regurgitation is not visualized. Aorta: The aortic root is normal in size and structure. Venous: The inferior vena cava is normal in size with greater than 50% respiratory variability, suggesting right atrial pressure of 3 mmHg. IAS/Shunts: The interatrial septum was not well visualized. LEFT VENTRICLE PLAX 2D LVIDd:         4.40 cm  Diastology LVIDs:         3.50 cm  LV e' medial:    8.81 cm/s LV PW:         0.90 cm  LV E/e' medial:  7.5 LV IVS:        1.20 cm  LV e' lateral:   8.59 cm/s LVOT diam:     1.80 cm  LV E/e' lateral: 7.7 LV SV:         63 LV SV Index:   31 LVOT Area:     2.54 cm  RIGHT VENTRICLE RV S prime:     15.90 cm/s TAPSE (M-mode): 2.7 cm LEFT ATRIUM             Index       RIGHT ATRIUM           Index LA diam:        3.20 cm 1.57 cm/m  RA Area:     17.60 cm LA Vol (A2C):   32.5 ml 15.91 ml/m RA Volume:   51.60 ml  25.26 ml/m LA Vol (A4C):   39.5 ml 19.34 ml/m LA Biplane Vol: 36.0 ml 17.62 ml/m  AORTIC VALVE AV Area (Vmax):    1.29 cm AV Area (Vmean):   1.37 cm AV Area (VTI):     1.37 cm AV Vmax:            196.00 cm/s AV Vmean:          130.000 cm/s AV VTI:            0.458 m AV Peak Grad:      15.4 mmHg AV Mean Grad:      7.7 mmHg LVOT Vmax:         99.70 cm/s LVOT Vmean:        69.800 cm/s LVOT VTI:          0.246 m LVOT/AV VTI ratio: 0.54  AORTA Ao Root diam: 2.70 cm MITRAL VALVE               TRICUSPID VALVE MV Area (PHT): 2.24 cm    TR Peak grad:   35.8 mmHg MV Decel Time: 338 msec    TR Vmax:        299.00 cm/s MV E velocity: 66.00 cm/s MV A velocity: 56.10 cm/s  SHUNTS MV E/A ratio:  1.18        Systemic VTI:  0.25 m                            Systemic Diam: 1.80 cm Epifanio Lesches MD Electronically signed by Epifanio Lesches MD Signature Date/Time:  12/31/2020/11:06:07 AM    Final    CT ANGIO HEAD CODE STROKE  Result Date: 12/29/2020 CLINICAL DATA:  Left-sided weakness.  Slurred speech. EXAM: CT ANGIOGRAPHY HEAD AND NECK TECHNIQUE: Multidetector CT imaging of the head and neck was performed using the standard protocol during bolus administration of intravenous contrast. Multiplanar CT image reconstructions and MIPs were obtained to evaluate the vascular anatomy. Carotid stenosis measurements (when applicable) are obtained utilizing NASCET criteria, using the distal internal carotid diameter as the denominator. CONTRAST:  85mL OMNIPAQUE IOHEXOL 350 MG/ML SOLN COMPARISON:  None. FINDINGS: CTA NECK FINDINGS Aortic arch: Standard 3 vessel aortic arch with mild atherosclerotic plaque. No significant arch vessel origin stenosis. Right carotid system: Patent with mild calcified plaque at the carotid bifurcation and in the proximal ICA resulting in less than 50% ICA stenosis. Left carotid system: Patent with moderate calcified and soft plaque at the carotid bifurcation and in the proximal ICA resulting in 50% proximal ICA stenosis. Vertebral arteries: Patent with calcified plaque at both vertebral artery origins. No evidence of significant stenosis or dissection. Mildly dominant right vertebral  artery. Skeleton: Widespread severe cervical facet arthrosis. Mild cervical disc degeneration. Other neck: Bilateral posterior cervical muscular atrophy. Upper chest: Mild centrilobular and paraseptal emphysema. Review of the MIP images confirms the above findings CTA HEAD FINDINGS Anterior circulation: The internal carotid arteries are patent from skull base to carotid termini with atherosclerotic plaque resulting in mild paraclinoid stenosis bilaterally. ACAs and MCAs are patent without evidence of a proximal branch occlusion or significant proximal stenosis. No aneurysm is identified. Posterior circulation: The intracranial vertebral arteries are patent to the basilar with mild atherosclerotic irregularity bilaterally but no significant stenosis. Patent PICA and SCA origins are seen bilaterally. The basilar artery is widely patent. Posterior communicating arteries are diminutive or absent. Both PCAs are patent without evidence of a significant proximal stenosis. There is a severe right P3 branch vessel stenosis. No aneurysm is identified. Venous sinuses: Not well evaluated due to arterial contrast timing. Anatomic variants: None. Review of the MIP images confirms the above findings IMPRESSION: 1. No large vessel occlusion. 2. Intracranial and cervical atherosclerosis resulting in mild bilateral intracranial ICA stenoses and 60% proximal left ICA stenosis. 3.  Aortic Atherosclerosis (ICD10-I70.0). These results were communicated to Dr. Wilford Corner at 6:43 pm on 12/29/2020 by text page via the [facility] messaging system. Electronically Signed   By: Sebastian Ache M.D.   On: 12/29/2020 19:15   CT ANGIO NECK CODE STROKE  Result Date: 12/29/2020 CLINICAL DATA:  Left-sided weakness.  Slurred speech. EXAM: CT ANGIOGRAPHY HEAD AND NECK TECHNIQUE: Multidetector CT imaging of the head and neck was performed using the standard protocol during bolus administration of intravenous contrast. Multiplanar CT image reconstructions and MIPs  were obtained to evaluate the vascular anatomy. Carotid stenosis measurements (when applicable) are obtained utilizing NASCET criteria, using the distal internal carotid diameter as the denominator. CONTRAST:  37mL OMNIPAQUE IOHEXOL 350 MG/ML SOLN COMPARISON:  None. FINDINGS: CTA NECK FINDINGS Aortic arch: Standard 3 vessel aortic arch with mild atherosclerotic plaque. No significant arch vessel origin stenosis. Right carotid system: Patent with mild calcified plaque at the carotid bifurcation and in the proximal ICA resulting in less than 50% ICA stenosis. Left carotid system: Patent with moderate calcified and soft plaque at the carotid bifurcation and in the proximal ICA resulting in 50% proximal ICA stenosis. Vertebral arteries: Patent with calcified plaque at both vertebral artery origins. No evidence of significant stenosis or dissection. Mildly dominant right  vertebral artery. Skeleton: Widespread severe cervical facet arthrosis. Mild cervical disc degeneration. Other neck: Bilateral posterior cervical muscular atrophy. Upper chest: Mild centrilobular and paraseptal emphysema. Review of the MIP images confirms the above findings CTA HEAD FINDINGS Anterior circulation: The internal carotid arteries are patent from skull base to carotid termini with atherosclerotic plaque resulting in mild paraclinoid stenosis bilaterally. ACAs and MCAs are patent without evidence of a proximal branch occlusion or significant proximal stenosis. No aneurysm is identified. Posterior circulation: The intracranial vertebral arteries are patent to the basilar with mild atherosclerotic irregularity bilaterally but no significant stenosis. Patent PICA and SCA origins are seen bilaterally. The basilar artery is widely patent. Posterior communicating arteries are diminutive or absent. Both PCAs are patent without evidence of a significant proximal stenosis. There is a severe right P3 branch vessel stenosis. No aneurysm is identified.  Venous sinuses: Not well evaluated due to arterial contrast timing. Anatomic variants: None. Review of the MIP images confirms the above findings IMPRESSION: 1. No large vessel occlusion. 2. Intracranial and cervical atherosclerosis resulting in mild bilateral intracranial ICA stenoses and 60% proximal left ICA stenosis. 3.  Aortic Atherosclerosis (ICD10-I70.0). These results were communicated to Dr. Wilford Corner at 6:43 pm on 12/29/2020 by text page via the [facility] messaging system. Electronically Signed   By: Sebastian Ache M.D.   On: 12/29/2020 19:15    Labs: BNP (last 3 results) No results for input(s): BNP in the last 8760 hours. Basic Metabolic Panel: Recent Labs  Lab 12/29/20 1825 12/30/20 0127 12/31/20 0446 01/01/21 0220  NA 134*  135  --  136 136  K 3.7  3.7  --  3.9 3.7  CL 100  99  --  102 101  CO2 26  --  29 25  GLUCOSE 157*  155*  --  95 92  BUN 17  20  --  8 9  CREATININE 0.97  0.90 0.66 0.62 0.58*  CALCIUM 9.1  --  8.6* 9.0   Liver Function Tests: Recent Labs  Lab 12/29/20 1825  AST 20  ALT 18  ALKPHOS 85  BILITOT 0.8  PROT 7.3  ALBUMIN 4.2   No results for input(s): LIPASE, AMYLASE in the last 168 hours. No results for input(s): AMMONIA in the last 168 hours. CBC: Recent Labs  Lab 12/29/20 1825 12/30/20 0127 12/31/20 0317 01/01/21 0220  WBC 8.5 8.6 6.4 7.8  NEUTROABS 5.2  --   --   --   HGB 14.1  14.6 13.5 13.0 14.2  HCT 43.1  43.0 40.3 39.0 42.1  MCV 91.7 91.2 90.9 89.4  PLT 201 182 168 173   Cardiac Enzymes: No results for input(s): CKTOTAL, CKMB, CKMBINDEX, TROPONINI in the last 168 hours. BNP: Invalid input(s): POCBNP CBG: Recent Labs  Lab 12/29/20 1818  GLUCAP 145*   D-Dimer No results for input(s): DDIMER in the last 72 hours. Hgb A1c Recent Labs    12/30/20 1035  HGBA1C 6.1*   Lipid Profile Recent Labs    12/30/20 0127  CHOL 140  HDL 59  LDLCALC 73  TRIG 39  CHOLHDL 2.4   Thyroid function studies Recent Labs     12/30/20 1035  TSH 0.938   Anemia work up No results for input(s): VITAMINB12, FOLATE, FERRITIN, TIBC, IRON, RETICCTPCT in the last 72 hours. Urinalysis    Component Value Date/Time   COLORURINE YELLOW 03/26/2014 1305   APPEARANCEUR CLEAR 03/26/2014 1305   LABSPEC 1.023 03/26/2014 1305   PHURINE 8.0 03/26/2014 1305  GLUCOSEU NEGATIVE 03/26/2014 1305   HGBUR NEGATIVE 03/26/2014 1305   HGBUR negative 10/26/2009 1451   BILIRUBINUR NEGATIVE 03/26/2014 1305   KETONESUR NEGATIVE 03/26/2014 1305   PROTEINUR NEGATIVE 03/26/2014 1305   UROBILINOGEN 0.2 03/26/2014 1305   NITRITE NEGATIVE 03/26/2014 1305   LEUKOCYTESUR NEGATIVE 03/26/2014 1305   Sepsis Labs Invalid input(s): PROCALCITONIN,  WBC,  LACTICIDVEN Microbiology Recent Results (from the past 240 hour(s))  Resp Panel by RT-PCR (Flu A&B, Covid) Nasopharyngeal Swab     Status: None   Collection Time: 12/29/20  7:51 PM   Specimen: Nasopharyngeal Swab; Nasopharyngeal(NP) swabs in vial transport medium  Result Value Ref Range Status   SARS Coronavirus 2 by RT PCR NEGATIVE NEGATIVE Final    Comment: (NOTE) SARS-CoV-2 target nucleic acids are NOT DETECTED.  The SARS-CoV-2 RNA is generally detectable in upper respiratory specimens during the acute phase of infection. The lowest concentration of SARS-CoV-2 viral copies this assay can detect is 138 copies/mL. A negative result does not preclude SARS-Cov-2 infection and should not be used as the sole basis for treatment or other patient management decisions. A negative result may occur with  improper specimen collection/handling, submission of specimen other than nasopharyngeal swab, presence of viral mutation(s) within the areas targeted by this assay, and inadequate number of viral copies(<138 copies/mL). A negative result must be combined with clinical observations, patient history, and epidemiological information. The expected result is Negative.  Fact Sheet for Patients:   BloggerCourse.comhttps://www.fda.gov/media/152166/download  Fact Sheet for Healthcare Providers:  SeriousBroker.ithttps://www.fda.gov/media/152162/download  This test is no t yet approved or cleared by the [place] FDA and  has been authorized for detection and/or diagnosis of SARS-CoV-2 by FDA under an Emergency Use Authorization (EUA). This EUA will remain  in effect (meaning this test can be used) for the duration of the COVID-19 declaration under Section 564(b)(1) of the Act, 21 U.S.C.section 360bbb-3(b)(1), unless the authorization is terminated  or revoked sooner.       Influenza A by PCR NEGATIVE NEGATIVE Final   Influenza B by PCR NEGATIVE NEGATIVE Final    Comment: (NOTE) The Xpert Xpress SARS-CoV-2/FLU/RSV plus assay is intended as an aid in the diagnosis of influenza from Nasopharyngeal swab specimens and should not be used as a sole basis for treatment. Nasal washings and aspirates are unacceptable for Xpert Xpress SARS-CoV-2/FLU/RSV testing.  Fact Sheet for Patients: BloggerCourse.comhttps://www.fda.gov/media/152166/download  Fact Sheet for Healthcare Providers: SeriousBroker.ithttps://www.fda.gov/media/152162/download  This test is not yet approved or cleared by the [place] FDA and has been authorized for detection and/or diagnosis of SARS-CoV-2 by FDA under an Emergency Use Authorization (EUA). This EUA will remain in effect (meaning this test can be used) for the duration of the COVID-19 declaration under Section 564(b)(1) of the Act, 21 U.S.C. section 360bbb-3(b)(1), unless the authorization is terminated or revoked.  Performed at [facility], [address] [place], [place] [address]      Time coordinating discharge: 25 minutes  SIGNED: Lanae Boastamesh Muskaan Smet, MD  [facility] 01/01/2021, 11:41 AM  If 7PM-7AM, please contact night-coverage [email or web address]

## 2021-01-03 ENCOUNTER — Encounter (HOSPITAL_COMMUNITY): Payer: Self-pay | Admitting: Internal Medicine

## 2021-01-03 ENCOUNTER — Other Ambulatory Visit: Payer: Self-pay

## 2021-01-03 ENCOUNTER — Telehealth: Payer: Self-pay

## 2021-01-03 DIAGNOSIS — I25118 Atherosclerotic heart disease of native coronary artery with other forms of angina pectoris: Secondary | ICD-10-CM

## 2021-01-03 DIAGNOSIS — Z79899 Other long term (current) drug therapy: Secondary | ICD-10-CM

## 2021-01-03 NOTE — Telephone Encounter (Signed)
-----   Message from Filbert Schilder, NP sent at 01/01/2021  9:55 AM EDT ----- Regarding: f/u appointment Please schedule this patient for a follow up with Jacques Navy or APP in the next 2-3 weeks. He had a PCI. Please call the patient and inform his of the date and time of appointment.   Thank you Noreene Larsson

## 2021-01-03 NOTE — Telephone Encounter (Signed)
Patient contacted regarding discharge from [facility] on 01/01/21.   Patient understands to follow up with Edd Fabian NP on 02/01/21 at 2:45 pm at [facility].  Patient understands discharge instructions? yes Patient understands medications and regiment? yes Patient understands to bring all medications to this visit? yes  Ask patient:  Are you enrolled in My Chart ...yes. If no ask patient if they would like to enroll.    Pt to call his PCP for a 1-2 week appt.   Pt to have BMET and CBC next week at the [facility].   *Will forward to Dr. Jacques Navy to determine if follow up is too far off.... unable to find sooner appt.

## 2021-01-03 NOTE — Telephone Encounter (Signed)
Jacob Ruiz - please move up Mr. Capizzi appt so it is with me on 4/25 for closer follow up. Thanks, GA

## 2021-01-05 NOTE — Telephone Encounter (Signed)
Pt's wife  is returning Jenna's phone call for husband to get a sooner appt.Please advise

## 2021-01-05 NOTE — Telephone Encounter (Signed)
Follow up appointment moved up to April 25th at 11AM in office with Dr. Jacques Navy. Patient and patients wife made aware and verbalized understanding.   Advised patient to call back to office with any issues, questions, or concerns. Patient verbalized understanding.

## 2021-01-05 NOTE — Telephone Encounter (Signed)
Attempted to contact patient, spoke with patients wife who states that she will have patient call back to office to make sooner appointment with Dr. Merry Lofty.

## 2021-01-06 ENCOUNTER — Other Ambulatory Visit: Payer: Self-pay | Admitting: Internal Medicine

## 2021-01-06 ENCOUNTER — Telehealth: Payer: Self-pay

## 2021-01-06 MED ORDER — CLOPIDOGREL BISULFATE 75 MG PO TABS: 75.0000 mg | ORAL_TABLET | Freq: Every day | ORAL | 0 refills | Status: DC

## 2021-01-06 MED ORDER — CLOPIDOGREL BISULFATE 300 MG PO TABS: 300.0000 mg | ORAL_TABLET | Freq: Once | ORAL | 0 refills | Status: AC

## 2021-01-06 NOTE — Telephone Encounter (Signed)
I think this may be more concerning than just antiplatelet concerns. Please arrange urgent office appointment for evaluation for heart failure and eval for mitral regurgitation. If he gets more unwell, needs to go to the ER given recent NSTEMI, this could represent a mechanical complication.

## 2021-01-06 NOTE — Telephone Encounter (Addendum)
Spoke with pt regarding Dr. Lupe Carney response and per phone call with Dr. Jacques Navy and this RN we will change anti-platelet therapy and speak to pt about ED precautions d/t changing positions causes pt's shortness of breath.    Explained to pt that the shortness of breath that he is experiencing may be being caused by something other than just the medication because it only happens when the pt changes from a sitting to a lying position. Explained to pt that if symptoms continue to increase that he may have to visit the ED for emergent care.  Explained to pt and wife how to switch antiplatelet medication. They both state that they understand the process and with call if they have questions.   Re-emphasized the importance of seeking medical attention if symptoms continue to worsen. Pt verbalizes understanding. Pt advised to call the office with any concerns in the next few days prior to his appointment on 01/17/21 with Dr. Jacques Navy.

## 2021-01-06 NOTE — Addendum Note (Signed)
Addended by: Bernita Buffy on: 01/06/2021 05:31 PM   Modules accepted: Orders

## 2021-01-06 NOTE — Telephone Encounter (Signed)
Opened encounter by mistake.

## 2021-01-06 NOTE — Telephone Encounter (Signed)
Spoke with pt on the phone regarding walk in to the office earlier today. Pt states that ever since he was discharged from the hospital on Saturday he has been having trouble sleeping. Pt states that within a few minutes of laying down on his side he has to sit back up and catch his breath. Pt states that he has tried to sleep propped on 2 pillows to help with breathing and this has not helped much. Pt states at best he is able to get about 1-1.5 hours of sleep a night. Pt has been taking all of his medication since he left the hospital including Brilinta.

## 2021-01-10 ENCOUNTER — Telehealth (HOSPITAL_COMMUNITY): Payer: Self-pay

## 2021-01-10 NOTE — Telephone Encounter (Signed)
Attempted to contact pt in regards to CR, left message with pt wife Drenda Freeze, for pt to return CR phone call.

## 2021-01-10 NOTE — Telephone Encounter (Signed)
Pt insurance is active and benefits verified through [facility]. Co-pay $0.00, DED $0.00/$0.00 met, out of pocket $3,600.00/$478.84 met, co-insurance 0%. No pre-authorization required. Passport, 01/10/21 @ 4:16PM, SL[facility]#[id number]  Will contact patient to see if he is interested in the Cardiac Rehab Program. If interested, patient will need to complete follow up appt. Once completed, patient will be contacted for scheduling upon review by the RN Navigator.

## 2021-01-17 ENCOUNTER — Ambulatory Visit: Payer: Medicare Other | Admitting: Internal Medicine

## 2021-01-17 ENCOUNTER — Encounter: Payer: Self-pay | Admitting: Internal Medicine

## 2021-01-17 ENCOUNTER — Telehealth: Payer: Self-pay | Admitting: Internal Medicine

## 2021-01-17 ENCOUNTER — Other Ambulatory Visit: Payer: Self-pay

## 2021-01-17 VITALS — BP 130/61 | HR 60 | Ht 69.0 in | Wt 191.4 lb

## 2021-01-17 DIAGNOSIS — I214 Non-ST elevation (NSTEMI) myocardial infarction: Secondary | ICD-10-CM | POA: Diagnosis not present

## 2021-01-17 DIAGNOSIS — I25118 Atherosclerotic heart disease of native coronary artery with other forms of angina pectoris: Secondary | ICD-10-CM | POA: Diagnosis not present

## 2021-01-17 DIAGNOSIS — I10 Essential (primary) hypertension: Secondary | ICD-10-CM | POA: Diagnosis not present

## 2021-01-17 DIAGNOSIS — Z79899 Other long term (current) drug therapy: Secondary | ICD-10-CM | POA: Diagnosis not present

## 2021-01-17 DIAGNOSIS — E7800 Pure hypercholesterolemia, unspecified: Secondary | ICD-10-CM | POA: Diagnosis not present

## 2021-01-17 NOTE — Telephone Encounter (Signed)
Medications that patient is currently taking. Will forward to Dr. Jacques Navy to make her aware.

## 2021-01-17 NOTE — Telephone Encounter (Signed)
Copy of patient medication  amLODipine (NORVASC) 10 MG tablet atorvastatin (LIPITOR) 40 MG tablet clopidogrel (PLAVIX) 75 MG tablet 81 mg aspirin

## 2021-01-17 NOTE — Patient Instructions (Signed)
Medication Instructions:  PLEASE TAKE ALL MEDICATIONS AS DIRECTED. PLEASE CALL WITH ANY ISSUES [phone] *If you need a refill on your cardiac medications before your next appointment, please call your pharmacy*  Follow-Up: At [facility], you and your health needs are our priority.  As part of our continuing mission to provide you with exceptional heart care, we have created designated Provider Care Teams.  These Care Teams include your primary Cardiologist (physician) and Advanced Practice Providers (APPs -  Physician Assistants and Nurse Practitioners) who all work together to provide you with the care you need, when you need it.   Your next appointment:   6 month(s)  The format for your next appointment:   In Person  Provider:   Weston Brass, MD

## 2021-01-17 NOTE — Progress Notes (Signed)
Cardiology Office Note:    Date:  01/17/2021   ID:  Jacob Ruiz, DOB Nov 29, 1945, MRN [id number]  PCP:  Sigmund Hazel, MD  Cardiologist:  Parke Poisson, MD  Electrophysiologist:  None   Referring MD: Sigmund Hazel, MD   Chief Complaint/Reason for Referral: S/p NSTEMI  History of Present Illness:    Jacob Ruiz is a 75 y.o. male with a history of HTN (previously non-compliant with medical therapy), COPD, and tobacco use.  He had an NSTEMI and on December 31, 2020, he underwent two stents to the left circumflex.   Today, he is feeling well. He has improved since his last visit. Today, he is accompanied by his wife. Prior to having a stent placed, he experienced a sharp pain in his chest. He states that the chest pain came from doing activities such as walking with his wife. He took Nitroglycerin and the chest pain resolved. This has resolved after revascularization.   He states that he is concerned his atorvastatin dose, he was confused on this medication and then did not take his amlodipine. I have asked him to call the office after the visit and review his pill bottles with Eileen Stanford RN.  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 Recommended he discuss this with his PCP.  Past Medical History:  Diagnosis Date  . Chronic bronchitis (HCC)    "get it just about q yr" (03/26/2014)  . H/O hiatal hernia     Past Surgical History:  Procedure Laterality Date  . CORONARY STENT INTERVENTION N/A 12/31/2020   Procedure: CORONARY STENT INTERVENTION;  Surgeon: Yvonne Kendall, MD;  Location: [facility] INVASIVE CV LAB;  Service: Cardiovascular;  Laterality: N/A;  . KNEE ARTHROSCOPY Right 1970's  . LEFT HEART CATH AND CORONARY ANGIOGRAPHY N/A 12/31/2020   Procedure: LEFT HEART CATH AND CORONARY ANGIOGRAPHY;  Surgeon: Yvonne Kendall, MD;  Location: [facility] INVASIVE CV LAB;  Service: Cardiovascular;  Laterality:  N/A;  . TONSILLECTOMY  1950's    Current Medications: Current Meds  Medication Sig  . acetaminophen (TYLENOL) 500 MG tablet Take 1,000 mg by mouth every 6 (six) hours as needed for mild pain.  Marland Kitchen amLODipine (NORVASC) 10 MG tablet Take 1 tablet (10 mg total) by mouth daily.  Marland Kitchen aspirin 81 MG chewable tablet Chew 1 tablet (81 mg total) by mouth daily.  Marland Kitchen atorvastatin (LIPITOR) 40 MG tablet Take 1 tablet (40 mg total) by mouth daily.  . clopidogrel (PLAVIX) 75 MG tablet Take 1 tablet (75 mg total) by mouth daily.  . isosorbide mononitrate (IMDUR) 30 MG 24 hr tablet Take 1 tablet (30 mg total) by mouth daily.  . metoprolol succinate (TOPROL-XL) 25 MG 24 hr tablet Take 1 tablet (25 mg total) by mouth daily.  . nicotine (NICODERM CQ - DOSED IN MG/24 HOURS) 21 mg/24hr patch Place 1 patch (21 mg total) onto the skin daily.  . nitroGLYCERIN (NITROSTAT) 0.4 MG SL tablet Place 1 tablet (0.4 mg total) under the tongue every 5 (five) minutes as needed for chest pain. YOU MAY TAKE (1) TABLET EVERY 5 MINS AS NEEDED FOR CHEST PAIN. DO NOT EXCEED 3 DOSES.     Allergies:   Lisinopril, Ace inhibitors, and Aleve [naproxen sodium]   Social History   Tobacco Use  . Smoking status: Current Every Day Smoker    Packs/day: 2.00    Years: 54.00  Pack years: 108.00    Types: Cigarettes  . Smokeless tobacco: Never Used  Substance Use Topics  . Alcohol use: No  . Drug use: No     Family History: [name] history includes Heart disease in his father; Heart failure in his mother.  ROS:   Please see the history of present illness.    All other systems reviewed and are negative.  EKGs/Labs/Other Studies Reviewed:    The following studies were reviewed today:  EKG:   01/17/2021- Normal sinus rhythm, mild ST depressions inferiorly   I have independently reviewed the images from [facility] 12/31/20.  Recent Labs: 12/29/2020: ALT 18 12/30/2020: TSH 0.938 01/01/2021: BUN 9; Creatinine, Ser 0.58; Hemoglobin  14.2; Platelets 173; Potassium 3.7; Sodium 136  Recent Lipid Panel    Component Value Date/Time   CHOL 140 12/30/2020 0127   TRIG 39 12/30/2020 0127   TRIG 55 07/30/2006 0958   HDL 59 12/30/2020 0127   CHOLHDL 2.4 12/30/2020 0127   VLDL 8 12/30/2020 0127   LDLCALC 73 12/30/2020 0127   LDLDIRECT 150.1 07/30/2006 0958    Physical Exam:    VS:  BP 130/61   Pulse 60   Ht 5\' 9"  (1.753 m)   Wt 191 lb 6.4 oz (86.8 kg)   SpO2 97%   BMI 28.26 kg/m     Wt Readings from Last 5 Encounters:  01/17/21 191 lb 6.4 oz (86.8 kg)  01/01/21 192 lb 14.4 oz (87.5 kg)  12/06/20 198 lb 3.2 oz (89.9 kg)  11/07/20 191 lb 2.2 oz (86.7 kg)  09/08/18 168 lb (76.2 kg)    Constitutional: No acute distress Eyes: sclera non-icteric, normal conjunctiva and lids ENMT: normal dentition, moist mucous membranes Cardiovascular: regular rhythm, normal rate, no murmurs. S1 and S2 normal. Radial pulses normal bilaterally. No jugular venous distention.  Respiratory: clear to auscultation bilaterally GI : normal bowel sounds, soft and nontender. No distention.   MSK: extremities warm, well perfused. No edema.  NEURO: grossly nonfocal exam, moves all extremities. PSYCH: alert and oriented x 3, normal mood and affect.   ASSESSMENT:    1. Coronary artery disease of native artery of native heart with stable angina pectoris (HCC)   2. NSTEMI (non-ST elevated myocardial infarction) (HCC)   3. Medication management   4. Primary hypertension   5. Pure hypercholesterolemia    PLAN:    Coronary artery disease of native artery of native heart with stable angina pectoris (HCC) NSTEMI (non-ST elevated myocardial infarction) [facility]) Medication management Primary hypertension Pure hypercholesterolemia - continue DAPT for 1 year, which will be completed on 12/31/21. ASA is indefinite therapy.  - I have instructed the patient that dual antiplatelet therapy should be taken for 1 year without interruption.  We have discussed  the consequences of interrupted dual antiplatelet therapy and the risk for in-stent thrombosis.  - continue amlodipine and atorvastatin.  - patient called in with his medications but does not have BB or long acting nitrate at home as part of medications.  Total time of encounter: 30 minutes total time of encounter, including 25 minutes spent in face-to-face patient care on the date of this encounter. This time includes coordination of care and counseling regarding above mentioned problem list. Remainder of non-face-to-face time involved reviewing chart documents/testing relevant to the patient encounter and documentation in the medical record. I have independently reviewed documentation from referring provider.   03/02/22, MD, [facility]  [facility]   Medication Adjustments/Labs and  Tests Ordered: Current medicines are reviewed at length with the patient today.  Concerns regarding medicines are outlined above.   No orders of the defined types were placed in this encounter.   No orders of the defined types were placed in this encounter.   Patient Instructions  Medication Instructions:  PLEASE TAKE ALL MEDICATIONS AS DIRECTED. PLEASE CALL WITH ANY ISSUES [phone] *If you need a refill on your cardiac medications before your next appointment, please call your pharmacy*  Follow-Up: At [facility], you and your health needs are our priority.  As part of our continuing mission to provide you with exceptional heart care, we have created designated Provider Care Teams.  These Care Teams include your primary Cardiologist (physician) and Advanced Practice Providers (APPs -  Physician Assistants and Nurse Practitioners) who all work together to provide you with the care you need, when you need it.   Your next appointment:   6 month(s)  The format for your next appointment:   In Person  Provider:   Weston Brass, MD   Caprice Beaver as a scribe for  Parke Poisson, MD.,have documented all relevant documentation on the behalf of Parke Poisson, MD,as directed by  Parke Poisson, MD while in the presence of Parke Poisson, MD.  I, Parke Poisson, MD, have reviewed all documentation for this visit. The documentation on 01/17/21 for the exam, diagnosis, procedures, and orders are all accurate and complete.

## 2021-01-17 NOTE — Telephone Encounter (Signed)
Patient's hospital dismissal list includes imdur and metoprolol, but I do not see these on his home med list when he called in. Please call patient and find out if he has these and is taking.

## 2021-01-18 NOTE — Telephone Encounter (Signed)
Returned call to patients wife (okay per patient) who states that patient is currently taking   Amlodipine 10 mg Daily Aspirin 81 mg Daily  Atorvastatin 40 mg Daily  Clopidogrel 75 mg Daily  Patients wife states that patient was discharged on Metoprolol succinate 25 mg Daily  and isosorbide 30 mg daily but states that patient ran out of refills and never started it back.   Advised patients wife that I would forward information to Dr. Jacques Navy and if she would like to make any changes I would call her back.   Advised patients wife to call back with any issues, questions, or concerns in the mean time.   Patients wife verbalized understanding.

## 2021-01-25 DIAGNOSIS — F17210 Nicotine dependence, cigarettes, uncomplicated: Secondary | ICD-10-CM | POA: Diagnosis not present

## 2021-01-25 DIAGNOSIS — I10 Essential (primary) hypertension: Secondary | ICD-10-CM | POA: Diagnosis not present

## 2021-01-25 DIAGNOSIS — E7800 Pure hypercholesterolemia, unspecified: Secondary | ICD-10-CM | POA: Diagnosis not present

## 2021-01-25 DIAGNOSIS — I2510 Atherosclerotic heart disease of native coronary artery without angina pectoris: Secondary | ICD-10-CM | POA: Diagnosis not present

## 2021-01-25 DIAGNOSIS — S82025D Nondisplaced longitudinal fracture of left patella, subsequent encounter for closed fracture with routine healing: Secondary | ICD-10-CM | POA: Diagnosis not present

## 2021-01-25 DIAGNOSIS — R7303 Prediabetes: Secondary | ICD-10-CM | POA: Diagnosis not present

## 2021-01-25 NOTE — Addendum Note (Signed)
Addended by: Felecia Jan on: 01/25/2021 05:04 PM   Modules accepted: Orders

## 2021-02-01 ENCOUNTER — Other Ambulatory Visit: Payer: Self-pay

## 2021-02-01 ENCOUNTER — Ambulatory Visit: Payer: Medicare Other | Admitting: General Practice

## 2021-02-01 ENCOUNTER — Other Ambulatory Visit: Payer: Self-pay | Admitting: Internal Medicine

## 2021-02-01 MED ORDER — ATORVASTATIN CALCIUM 40 MG PO TABS: 40.0000 mg | ORAL_TABLET | Freq: Every day | ORAL | 0 refills | Status: DC

## 2021-02-23 ENCOUNTER — Encounter (HOSPITAL_COMMUNITY): Payer: Self-pay

## 2021-02-23 ENCOUNTER — Telehealth (HOSPITAL_COMMUNITY): Payer: Self-pay

## 2021-02-23 NOTE — Telephone Encounter (Signed)
Attempted to call patient in regards to Cardiac Rehab - LM on VM Mailed letter 

## 2021-02-24 ENCOUNTER — Telehealth: Payer: Self-pay | Admitting: Internal Medicine

## 2021-02-24 NOTE — Telephone Encounter (Signed)
Returned call to patients wife (okay per patient) who states that patient was calling in regard to issues with bruising every time he bumps into something. Patients wife states that patient is just concerned that his blood is too thin, and worried about all of the bruising. Patients wife states that patient has not had any overt bleeding, blood in stools, or any other issues.   Advised patients wife of indications for Plavix and importance of the medication. Advised that I would forward message to PharmD and Dr. Jacques Navy for them to review to see if they had any recommendations. Patients wife verbalized understanding.

## 2021-02-24 NOTE — Telephone Encounter (Signed)
Jacob Poisson, MD to Me . Cv Div Pharmd     02/24/21 3:09 PM ASA and plavix are recommended for 1 year from stent. Fortunately, bruising is not considered to be major bleeding, so while this might be cosmetically bothersome, the blood is unlikely to be "too thin" and the benefit of continuing DAPT outweighs the risk of stopping.   If he is concerned, I would recommend a CBC with platelets so we can confirm no anemia or thrombocytopenia.   Thanks,  GA   Returned call to patient, made patient aware of Dr. Lupe Carney recommendations. Patient would like to wait on the CBC for now, he states that the bruising is not significant and that he will call back with issues. Advised patient to return call to office with any issues, questions, or concerns. Patient verbalized understanding.

## 2021-02-24 NOTE — Telephone Encounter (Signed)
Pt c/o medication issue:  1. Name of Medication:   clopidogrel (PLAVIX) 75 MG tablet   Baby aspirin  2. How are you currently taking this medication (dosage and times per day)? 1 tablet daily of each  3. Are you having a reaction (difficulty breathing--STAT)? no  4. What is your medication issue? Patient states he has been getting bruises when he bumps himself. He states he is concerned he is taking too much blood thinner.

## 2021-02-28 ENCOUNTER — Other Ambulatory Visit: Payer: Self-pay | Admitting: Internal Medicine

## 2021-03-01 ENCOUNTER — Other Ambulatory Visit: Payer: Self-pay | Admitting: Internal Medicine

## 2021-03-07 ENCOUNTER — Other Ambulatory Visit: Payer: Self-pay

## 2021-03-07 ENCOUNTER — Telehealth: Payer: Self-pay | Admitting: Internal Medicine

## 2021-03-07 ENCOUNTER — Other Ambulatory Visit: Payer: Self-pay | Admitting: Internal Medicine

## 2021-03-07 MED ORDER — ATORVASTATIN CALCIUM 40 MG PO TABS: 40.0000 mg | ORAL_TABLET | Freq: Every day | ORAL | 3 refills | Status: AC

## 2021-03-07 MED ORDER — CLOPIDOGREL BISULFATE 75 MG PO TABS: 75.0000 mg | ORAL_TABLET | Freq: Every day | ORAL | 0 refills | Status: DC

## 2021-03-07 NOTE — Telephone Encounter (Signed)
*  STAT* If patient is at the pharmacy, call can be transferred to refill team.   1. Which medications need to be refilled? (please list name of each medication and dose if known)  atorvastatin (LIPITOR) 40 MG tablet  2. Which pharmacy/location (including street and city if local pharmacy) is medication to be sent to? [id number] - [place], [place] - [address] AT [facility] OF [address]  3. Do they need a 30 day or 90 day supply? 30 days

## 2021-03-08 ENCOUNTER — Telehealth: Payer: Self-pay | Admitting: Internal Medicine

## 2021-03-08 MED ORDER — AMLODIPINE BESYLATE 10 MG PO TABS: 10.0000 mg | ORAL_TABLET | Freq: Every day | ORAL | 3 refills | Status: AC

## 2021-03-08 NOTE — Telephone Encounter (Signed)
*  STAT* If patient is at the pharmacy, call can be transferred to refill team.   1. Which medications need to be refilled? (please list name of each medication and dose if known) Amlodipine   2. Which pharmacy/location (including street and city if local pharmacy) is medication to be sent to? [facility]   3. Do they need a 30 day or 90 day supply? 30 but would like 90

## 2021-03-10 NOTE — Telephone Encounter (Signed)
Called patient to see if he was interested in participating in the Cardiac Rehab Program. Patient stated yes. Patient will come in for orientation on 03/29/2021@2 :45pm and will attend the 9:15am exercise class.   Pensions consultant.

## 2021-03-10 NOTE — Telephone Encounter (Signed)
Called pt to see if he was interested in scheduling for the cardiac rehab program, pt stated yes as I was scheduling pt and going over everything, pt went on a rant about he doesn't believe in getting the covid vaccines I advised pt that the vaccines are not required to participate in the cardiac rehab program, pt understood. Pt then stated that he "IS NOT GOING TO WEAR A MASK AND IF HE DOES IT WILL BE UNDERNEATH HIS NOSE". I advised pt that it is hospital policy that masks are worn at the hospital in outpatient services. Pt went on another rant about wearing a mask, I advised pt of other options like going to a gym or doing exercises at his home or on his on where he doesn't have to wear a mask. Pt stated that he will talk to his doctor on tomorrow and talk with his wife.

## 2021-03-11 ENCOUNTER — Other Ambulatory Visit: Payer: Self-pay

## 2021-03-11 ENCOUNTER — Ambulatory Visit: Payer: Medicare Other | Admitting: Internal Medicine

## 2021-03-11 ENCOUNTER — Encounter: Payer: Self-pay | Admitting: Internal Medicine

## 2021-03-11 VITALS — BP 127/82 | HR 65 | Ht 69.5 in | Wt 192.6 lb

## 2021-03-11 DIAGNOSIS — I10 Essential (primary) hypertension: Secondary | ICD-10-CM | POA: Diagnosis not present

## 2021-03-11 DIAGNOSIS — R600 Localized edema: Secondary | ICD-10-CM | POA: Diagnosis not present

## 2021-03-11 DIAGNOSIS — I25118 Atherosclerotic heart disease of native coronary artery with other forms of angina pectoris: Secondary | ICD-10-CM | POA: Diagnosis not present

## 2021-03-11 DIAGNOSIS — E7800 Pure hypercholesterolemia, unspecified: Secondary | ICD-10-CM | POA: Diagnosis not present

## 2021-03-11 DIAGNOSIS — R079 Chest pain, unspecified: Secondary | ICD-10-CM | POA: Diagnosis not present

## 2021-03-11 DIAGNOSIS — I214 Non-ST elevation (NSTEMI) myocardial infarction: Secondary | ICD-10-CM | POA: Diagnosis not present

## 2021-03-11 DIAGNOSIS — Z79899 Other long term (current) drug therapy: Secondary | ICD-10-CM

## 2021-03-11 MED ORDER — ISOSORBIDE MONONITRATE ER 30 MG PO TB24: 30.0000 mg | ORAL_TABLET | Freq: Every day | ORAL | 2 refills | Status: AC

## 2021-03-11 NOTE — Patient Instructions (Addendum)
Medication Instructions:  Per Dr Jacques Navy  you do not need to restart  Metprolol  She would like you to restart  Isosorbide mononitrate 30 mg  daily   *If you need a refill on your cardiac medications before your next appointment, please call your pharmacy*   Lab Work:  Not needed   Testing/Procedures:  Not needed  Follow-Up: At [facility], you and your health needs are our priority.  As part of our continuing mission to provide you with exceptional heart care, we have created designated Provider Care Teams.  These Care Teams include your primary Cardiologist (physician) and Advanced Practice Providers (APPs -  Physician Assistants and Nurse Practitioners) who all work together to provide you with the care you need, when you need it.     Your next appointment:   5 month(s) keep appointment in OCt/Nov 2022  The format for your next appointment:   In Person  Provider:   Weston Brass, MD   Other Instructions  See other sheet for information

## 2021-03-11 NOTE — Progress Notes (Signed)
Cardiology Office Note:    Date:  03/11/2021   ID:  Jacob Ruiz, DOB [birth year], MRN [id number]  PCP:  Sigmund Hazel, MD  Cardiologist:  Parke Poisson, MD  Electrophysiologist:  None   Referring MD: Sigmund Hazel, MD   Chief Complaint/Reason for Referral: S/p NSTEMI  History of Present Illness:    Jacob Ruiz is a 75 y.o. male with a history of HTN, COPD, and tobacco use.  He had an NSTEMI and on December 31, 2020, he underwent two stents to the left circumflex.   Previous Visit: At his last visit, he was feeling well. He has improved since his last visit, and he is accompanied by his wife. Prior to having a stent placed, he experienced a sharp pain in his chest. He states that the chest pain came from doing activities such as walking with his wife. He took Nitroglycerin and the chest pain resolved. This has resolved after revascularization.   He states that he is concerned his atorvastatin dose, he was confused on this medication and then did not take his amlodipine. I have asked him to call the office after the visit and review his pill bottles with Eileen Stanford RN.  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 Recommended he discuss this with his PCP.  03/11/2021 Today, he is accompanied by his wife, who also provides some history. On 6/14 and 6/15, he needed to take his nitroglycerin 3 times each day for angina. It seems that his chest pain is usually caused by stress and anxiety when he thinks about his work issues and not being able to get his CDL. His chest pain has usually been dissipating in 5 minutes after nitro. His wife states he is worried about something every time he has chest pain. No chest pain with activity.  His wife reports she makes sure he drinks more water. He has been  having some LE edema, especially noticeable when he removes his socks. Of note, they believe his LE edema  began this year. Also, his left LE shows varicose veins. He has noticed that the swelling improves when he is drinking plenty of water. The edema is worse with hot weather. Suspect either amlodipine or venous varicosities as the culprit.  He walks about 300 feet down the sidewalk for exercise. When he returns home he is breathing somewhat heavily, but he is not overly concerned about this. However, he occasionally wakes up in the middle of the night with dyspnea and shortness of breath. Typically he sleeps on his right side. EF normal at time of NSTEMI.  He remains compliant with all of his medications, and typically takes them with breakfast. However when he ran out of imdur and metoprolol after hospital stay, he discontinued these. For his diet he will avoid adding salt. However, he often has gravy and other junk foods, including sausage and bacon.  He denies any palpitations, headaches, lightheadedness, syncope.    Past Medical History:  Diagnosis Date   Chronic bronchitis (HCC)    "get it just about q yr" (03/26/2014)   H/O hiatal hernia     Past Surgical History:  Procedure Laterality Date   CORONARY STENT INTERVENTION N/A 12/31/2020   Procedure: CORONARY STENT INTERVENTION;  Surgeon: Yvonne Kendall, MD;  Location: [facility] CV [facility];  Service: Cardiovascular;  Laterality: N/A;   KNEE ARTHROSCOPY Right 1970's  LEFT HEART CATH AND CORONARY ANGIOGRAPHY N/A 12/31/2020   Procedure: LEFT HEART CATH AND CORONARY ANGIOGRAPHY;  Surgeon: Yvonne Kendall, MD;  Location: [facility] CV [facility];  Service: Cardiovascular;  Laterality: N/A;   TONSILLECTOMY  1950's    Current Medications: Current Meds  Medication Sig   acetaminophen (TYLENOL) 500 MG tablet Take 1,000 mg by mouth every 6 (six) hours as needed for mild pain.   amLODipine (NORVASC) 10 MG tablet Take 1 tablet (10 mg total) by mouth daily.   atorvastatin (LIPITOR) 40 MG tablet Take 1 tablet (40 mg total) by mouth daily.   clopidogrel  (PLAVIX) 75 MG tablet Take 1 tablet (75 mg total) by mouth daily.   nicotine (NICODERM CQ - DOSED IN MG/24 HOURS) 21 mg/24hr patch Place 1 patch (21 mg total) onto the skin daily.   [DISCONTINUED] isosorbide mononitrate (IMDUR) 30 MG 24 hr tablet Take 1 tablet (30 mg total) by mouth daily.     Allergies:   Lisinopril, Ace inhibitors, and Aleve [naproxen sodium]   Social History   Tobacco Use   Smoking status: Every Day    Packs/day: 2.00    Years: 54.00    Pack years: 108.00    Types: Cigarettes   Smokeless tobacco: Never  Substance Use Topics   Alcohol use: No   Drug use: No     Family History: [name] history includes Heart disease in his father; Heart failure in his mother.  ROS:   Please see the history of present illness.    (+) Chest pain (+) Stress/Anxiety (+) LE edema (+) Varicose veins (+) Shortness of breath (+) PND All other systems reviewed and are negative.  EKGs/Labs/Other Studies Reviewed:    The following studies were reviewed today:  LHC 12/31/2020: Conclusions: Significant two-vessel coronary artery disease, including sequential 99% proximal/mid and 70% mid/distal LCx lesions as well as 80-90% ostial rPDA stenosis.  Moderate diffuse disease noted in the LAD. Normal left ventricular filling pressure. Successful PCI to LCx using non-overlapping Resolute Onyx 2.25 x 15 mm (proximal/mid, post-dilated to 2.5 mm) and 2.0 x 12 mm (mid/distal, post-dilated to 2.2 mm) drug-eluting stents with 0% residual stenosis and TIMI-3 flow.   Recommendations: Dual antiplatelet therapy with aspirin and ticagrelor for at least 12 months. Aggressive secondary prevention.  If the patient has persistent angina refractory to aggressive medical therapy, PCI to ostial rPDA could be considered.   Echo 12/31/2020: 1. Left ventricular ejection fraction, by estimation, is 60 to 65%. The  left ventricle has normal function. The left ventricle has no regional  wall motion  abnormalities. There is mild left ventricular hypertrophy.  Left ventricular diastolic parameters  are indeterminate.   2. Right ventricular systolic function is normal. The right ventricular  size is mildly enlarged. There is mildly elevated pulmonary artery  systolic pressure. The estimated right ventricular systolic pressure is  38.8 mmHg.   3. The mitral valve is normal in structure. No evidence of mitral  stenosis.   4. The aortic valve was not well visualized. Aortic valve regurgitation  is not visualized. Mild aortic valve stenosis. MG , AVA 1.4 cm^2, DI  0.53   5. The inferior vena cava is normal in size with greater than 50%  respiratory variability, suggesting right atrial pressure of 3 mmHg.   CT Coronary FFR 11/08/2020: Possible hemodynamically significant stenosis in a branch off the PLV. Borderline hemodynamically significant stenosis in the mid LCx. Depending on symptoms, these findings could be managed medically.  CT Coronary Morph 11/08/2020: 1. Coronary artery calcium score 4057 Agatston units. This places the patient in the 97th percentile for age and gender, suggesting high risk for future cardiac events.   2. Very difficult study to interpret due to very high calcium score.   3. Suspect there is not hemodynamically significant stenosis in the RCA, but difficult to comment on the more distal PDA and PLV due to blooming artifact.   4.  There does not appear to be severe disease in the LAD system.   5. Difficult to comment on proximal to mid LCX due to extensive blooming artifact.   Will send for FFR.   EKG:   03/11/2021: EKG is not ordered today. 01/17/2021: Normal sinus rhythm, mild ST depressions inferiorly   I have independently reviewed the images from [facility] 12/31/20.  Recent Labs: 12/29/2020: ALT 18 12/30/2020: TSH 0.938 01/01/2021: BUN 9; Creatinine, Ser 0.58; Hemoglobin 14.2; Platelets 173; Potassium 3.7; Sodium 136  Recent Lipid Panel    Component  Value Date/Time   CHOL 140 12/30/2020 0127   TRIG 39 12/30/2020 0127   TRIG 55 07/30/2006 0958   HDL 59 12/30/2020 0127   CHOLHDL 2.4 12/30/2020 0127   VLDL 8 12/30/2020 0127   LDLCALC 73 12/30/2020 0127   LDLDIRECT 150.1 07/30/2006 0958    Physical Exam:    VS:  BP 127/82   Pulse 65   Ht 5' 9.5" (1.765 m)   Wt 192 lb 9.6 oz (87.4 kg)   SpO2 94%   BMI 28.03 kg/m     Wt Readings from Last 5 Encounters:  01/17/21 191 lb 6.4 oz (86.8 kg)  01/01/21 192 lb 14.4 oz (87.5 kg)  12/06/20 198 lb 3.2 oz (89.9 kg)  11/07/20 191 lb 2.2 oz (86.7 kg)  09/08/18 168 lb (76.2 kg)    Constitutional: No acute distress Eyes: sclera non-icteric, normal conjunctiva and lids ENMT: normal dentition, moist mucous membranes Cardiovascular: regular rhythm, normal rate, no murmurs. S1 and S2 normal. Radial pulses normal bilaterally. No jugular venous distention.  Respiratory: clear to auscultation bilaterally GI : normal bowel sounds, soft and nontender. No distention.   MSK: extremities warm, well perfused. Varicose veins bilaterally. Mild bilateral LE edema.  NEURO: grossly nonfocal exam, moves all extremities. PSYCH: alert and oriented x 3, normal mood and affect.   ASSESSMENT:    1. Cardiac chest pain   2. Coronary artery disease of native artery of native heart with stable angina pectoris (HCC)   3. NSTEMI (non-ST elevated myocardial infarction) (HCC)   4. Primary hypertension   5. Pure hypercholesterolemia   6. Medication management   7. Bilateral lower extremity edema    PLAN:    Cardiac chest pain Coronary artery disease of native artery of native heart with stable angina pectoris (HCC) NSTEMI (non-ST elevated myocardial infarction) [facility]) Medication management Primary hypertension Pure hypercholesterolemia - continue DAPT for 1 year, which will be completed on 12/31/21. ASA is indefinite therapy.  - I have instructed the patient that dual antiplatelet therapy should be taken for 1  year without interruption.  We have discussed the consequences of interrupted dual antiplatelet therapy and the risk for in-stent thrombosis.  - continue amlodipine and atorvastatin.  - patient called in with his medications but does not have BB or long acting nitrate at home as part of medications. - for angina which is likely from CAD, I would recommend starting back on Imdur 30 mg daily and monitor for recurrent chest pain. If  chest pain continues, consider ischemic testing with Nuc stress or directly to cath if chest pain does not respond to nitro x 3. Discusssed in detail today.   LE Edema -Recommend he wear knee-length compression socks for LE edema and varicose veins. -Instructed the patient to continue with dietary changes and reduce salt intake greatly.  Follow-up with previously scheduled appointment in October.  Total time of encounter: 35 minutes total time of encounter, including 26 minutes spent in face-to-face patient care on the date of this encounter. This time includes coordination of care and counseling regarding above mentioned problem list. Remainder of non-face-to-face time involved reviewing chart documents/testing relevant to the patient encounter and documentation in the medical record. I have independently reviewed documentation from referring provider.   Weston Brass, MD, [facility]  [facility]   Medication Adjustments/Labs and Tests Ordered: Current medicines are reviewed at length with the patient today.  Concerns regarding medicines are outlined above.   No orders of the defined types were placed in this encounter.   Meds ordered this encounter  Medications   isosorbide mononitrate (IMDUR) 30 MG 24 hr tablet    Sig: Take 1 tablet (30 mg total) by mouth daily.    Dispense:  90 tablet    Refill:  2    Patient Instructions  Medication Instructions:  Per Dr Jacques Navy  you do not need to restart  Metprolol  She would like you to restart   Isosorbide mononitrate 30 mg  daily   *If you need a refill on your cardiac medications before your next appointment, please call your pharmacy*   Lab Work:  Not needed   Testing/Procedures:  Not needed  Follow-Up: At [facility], you and your health needs are our priority.  As part of our continuing mission to provide you with exceptional heart care, we have created designated Provider Care Teams.  These Care Teams include your primary Cardiologist (physician) and Advanced Practice Providers (APPs -  Physician Assistants and Nurse Practitioners) who all work together to provide you with the care you need, when you need it.     Your next appointment:   5 month(s) keep appointment in OCt/Nov 2022  The format for your next appointment:   In Person  Provider:   Weston Brass, MD   Other Instructions  See other sheet for information     I,Mathew Stumpf,acting as a scribe for Parke Poisson, MD.,have documented all relevant documentation on the behalf of Parke Poisson, MD,as directed by  Parke Poisson, MD while in the presence of Parke Poisson, MD.  I, Parke Poisson, MD, have reviewed all documentation for this visit. The documentation on 03/11/21 for the exam, diagnosis, procedures, and orders are all accurate and complete.

## 2021-03-12 DIAGNOSIS — R0789 Other chest pain: Secondary | ICD-10-CM | POA: Diagnosis not present

## 2021-03-12 DIAGNOSIS — R079 Chest pain, unspecified: Secondary | ICD-10-CM | POA: Diagnosis not present

## 2021-03-12 DIAGNOSIS — R0602 Shortness of breath: Secondary | ICD-10-CM | POA: Diagnosis not present

## 2021-03-12 DIAGNOSIS — Z743 Need for continuous supervision: Secondary | ICD-10-CM | POA: Diagnosis not present

## 2021-03-12 DIAGNOSIS — I499 Cardiac arrhythmia, unspecified: Secondary | ICD-10-CM | POA: Diagnosis not present

## 2021-03-18 ENCOUNTER — Telehealth (HOSPITAL_COMMUNITY): Payer: Self-pay | Admitting: Family Medicine

## 2021-03-18 NOTE — Telephone Encounter (Signed)
Returned pt phone call, pt wanted to know if we had a diff morning class available. Adv pt we had a 10:45 class but that will change his start date and an afternoon class. Pt then stated he will stay with class time of 9:15.

## 2021-03-24 ENCOUNTER — Telehealth (HOSPITAL_COMMUNITY): Payer: Self-pay

## 2021-03-29 ENCOUNTER — Encounter (HOSPITAL_COMMUNITY): Payer: Self-pay

## 2021-03-29 ENCOUNTER — Encounter (HOSPITAL_COMMUNITY)
Admission: RE | Admit: 2021-03-29 | Discharge: 2021-03-29 | Disposition: A | Discharge status: Home / Self Care | Payer: Medicare Other | Source: Ambulatory Visit | Attending: Internal Medicine | Admitting: Internal Medicine

## 2021-03-29 ENCOUNTER — Telehealth: Payer: Self-pay | Admitting: Internal Medicine

## 2021-03-29 ENCOUNTER — Other Ambulatory Visit: Payer: Self-pay

## 2021-03-29 VITALS — BP 108/72 | HR 59 | Ht 70.0 in | Wt 195.5 lb

## 2021-03-29 DIAGNOSIS — I444 Left anterior fascicular block: Secondary | ICD-10-CM | POA: Insufficient documentation

## 2021-03-29 DIAGNOSIS — I214 Non-ST elevation (NSTEMI) myocardial infarction: Secondary | ICD-10-CM

## 2021-03-29 DIAGNOSIS — I252 Old myocardial infarction: Secondary | ICD-10-CM | POA: Insufficient documentation

## 2021-03-29 DIAGNOSIS — Z955 Presence of coronary angioplasty implant and graft: Secondary | ICD-10-CM | POA: Insufficient documentation

## 2021-03-29 HISTORY — DX: Atherosclerotic heart disease of native coronary artery without angina pectoris: I25.10

## 2021-03-29 HISTORY — DX: Hyperlipidemia, unspecified: E78.5

## 2021-03-29 NOTE — Telephone Encounter (Signed)
Got a call from [facility] from Cardiac Rehab and she wanted Dr. Lupe Carney nurse to give her a call regarding patient resuming exercise.

## 2021-03-29 NOTE — Progress Notes (Signed)
Cardiac Individual Treatment Plan  Patient Details  Name: Jacob Ruiz MRN: [id number] Date of Birth: [birth year] Referring Provider:   Flowsheet Row CARDIAC REHAB PHASE II ORIENTATION from 03/29/2021 in [facility] CARDIAC REHAB  Referring Provider Weston Brass, MD       Initial Encounter Date:  Flowsheet Row CARDIAC REHAB PHASE II ORIENTATION from 03/29/2021 in [facility] CARDIAC REHAB  Date 03/29/21       Visit Diagnosis: 12/29/20 NSTEMI   12/31/20 S/P DES LCX x 2   Patient's Home Medications on Admission:  Current Outpatient Medications:    amLODipine (NORVASC) 10 MG tablet, Take 1 tablet (10 mg total) by mouth daily., Disp: 90 tablet, Rfl: 3   aspirin EC 81 MG tablet, Take 81 mg by mouth daily. Swallow whole., Disp: , Rfl:    atorvastatin (LIPITOR) 40 MG tablet, Take 1 tablet (40 mg total) by mouth daily., Disp: 90 tablet, Rfl: 3   clopidogrel (PLAVIX) 75 MG tablet, Take 1 tablet (75 mg total) by mouth daily., Disp: 60 tablet, Rfl: 0   isosorbide mononitrate (IMDUR) 30 MG 24 hr tablet, Take 1 tablet (30 mg total) by mouth daily., Disp: 90 tablet, Rfl: 2   nitroGLYCERIN (NITROSTAT) 0.4 MG SL tablet, Place 1 tablet (0.4 mg total) under the tongue every 5 (five) minutes as needed for chest pain. YOU MAY TAKE (1) TABLET EVERY 5 MINS AS NEEDED FOR CHEST PAIN. DO NOT EXCEED 3 DOSES., Disp: 90 tablet, Rfl: 3   nicotine (NICODERM CQ - DOSED IN MG/24 HOURS) 21 mg/24hr patch, Place 1 patch (21 mg total) onto the skin daily. (Patient not taking: No sig reported), Disp: 28 patch, Rfl: 0  Past Medical History: Past Medical History:  Diagnosis Date   Chronic bronchitis (HCC)    "get it just about q yr" (03/26/2014)   Coronary artery disease    H/O hiatal hernia    Hyperlipidemia     Tobacco Use: Social History   Tobacco Use  Smoking Status Every Day   Packs/day: 0.50   Years: 54.00   Pack years: 27.00   Types: Cigarettes  Smokeless Tobacco  Never  Tobacco Comments   Patient given the phone number 1-800-quit-now. Patient has no desire to quit    Labs: Recent Review Flowsheet Data     Labs for ITP Cardiac and Pulmonary Rehab Latest Ref Rng & Units 02/01/2009 03/26/2014 11/07/2020 12/29/2020 12/30/2020   Cholestrol 0 - 200 mg/dL 496 - 759 - 163   LDLCALC 0 - 99 mg/dL 846(K) - 599(J) - 73   LDLDIRECT mg/dL - - - - -   HDL >57 mg/dL 01.77 - 64 - 59   Trlycerides <150 mg/dL 93.9 - 76 - 39   Hemoglobin A1c 4.8 - 5.6 % - - - - 6.1(H)   TCO2 22 - 32 mmol/L - 28 - 26 -       Capillary Blood Glucose: Lab Results  Component Value Date   GLUCAP 145 (H) 12/29/2020     Exercise Target Goals: Exercise Program Goal: Individual exercise prescription set using results from initial 6 min walk test and THRR while considering  patient's activity barriers and safety.   Exercise Prescription Goal: Starting with aerobic activity 30 plus minutes a day, 3 days per week for initial exercise prescription. Provide home exercise prescription and guidelines that participant acknowledges understanding prior to discharge.  Activity Barriers & Risk Stratification:  Activity Barriers & Cardiac Risk Stratification - 03/29/21 1625  Activity Barriers & Cardiac Risk Stratification   Activity Barriers Back Problems;Neck/Spine Problems;Joint Problems;Shortness of Breath;Chest Pain/Angina;Balance Concerns;History of Falls    Cardiac Risk Stratification High             6 Minute Walk:  6 Minute Walk     Row Name 03/29/21 1542         6 Minute Walk   Phase Initial     Distance 1441 feet     Walk Time 6 minutes     # of Rest Breaks 0     MPH 2.73     METS 2.71     RPE 11     Perceived Dyspnea  1     VO2 Peak 9047     Symptoms Yes (comment)     Comments SOB, RPD = 1.  Denies pain     Resting HR 61 bpm     Resting BP 108/72     Resting Oxygen Saturation  94 %     Exercise Oxygen Saturation  during 6 min walk 95 %     Max Ex. HR 71  bpm     Max Ex. BP 140/70     2 Minute Post BP 114/62              Oxygen Initial Assessment:   Oxygen Re-Evaluation:   Oxygen Discharge (Final Oxygen Re-Evaluation):   Initial Exercise Prescription:  Initial Exercise Prescription - 03/29/21 1600       Date of Initial Exercise RX and Referring Provider   Date 03/29/21    Referring Provider Weston Brass, MD    Expected Discharge Date 05/27/21      NuStep   Level 2    SPM 75    Minutes 15    METs 2      Arm Ergometer   Level 2    Minutes 15    METs 2      Prescription Details   Frequency (times per week) 3    Duration Progress to 30 minutes of continuous aerobic without signs/symptoms of physical distress      Intensity   THRR 40-80% of Max Heartrate 58-117    Ratings of Perceived Exertion 11-13    Perceived Dyspnea 0-4      Progression   Progression Continue progressive overload as per policy without signs/symptoms or physical distress.      Resistance Training   Training Prescription Yes    Weight 4 lbs    Reps 10-15             Perform Capillary Blood Glucose checks as needed.  Exercise Prescription Changes:   Exercise Comments:   Exercise Goals and Review:   Exercise Goals     Row Name 03/29/21 1625             Exercise Goals   Increase Physical Activity Yes       Intervention Provide advice, education, support and counseling about physical activity/exercise needs.;Develop an individualized exercise prescription for aerobic and resistive training based on initial evaluation findings, risk stratification, comorbidities and participant's personal goals.       Expected Outcomes Short Term: Attend rehab on a regular basis to increase amount of physical activity.;Long Term: Add in home exercise to make exercise part of routine and to increase amount of physical activity.;Long Term: Exercising regularly at least 3-5 days a week.       Increase Strength and Stamina Yes  Intervention Provide advice, education, support and counseling about physical activity/exercise needs.;Develop an individualized exercise prescription for aerobic and resistive training based on initial evaluation findings, risk stratification, comorbidities and participant's personal goals.       Expected Outcomes Short Term: Increase workloads from initial exercise prescription for resistance, speed, and METs.;Short Term: Perform resistance training exercises routinely during rehab and add in resistance training at home;Long Term: Improve cardiorespiratory fitness, muscular endurance and strength as measured by increased METs and functional capacity (6MWT)       Able to understand and use rate of perceived exertion (RPE) scale Yes       Intervention Provide education and explanation on how to use RPE scale       Expected Outcomes Short Term: Able to use RPE daily in rehab to express subjective intensity level;Long Term:  Able to use RPE to guide intensity level when exercising independently       Knowledge and understanding of Target Heart Rate Range (THRR) Yes       Intervention Provide education and explanation of THRR including how the numbers were predicted and where they are located for reference       Expected Outcomes Short Term: Able to state/look up THRR;Short Term: Able to use daily as guideline for intensity in rehab;Long Term: Able to use THRR to govern intensity when exercising independently       Understanding of Exercise Prescription Yes       Intervention Provide education, explanation, and written materials on patient's individual exercise prescription       Expected Outcomes Short Term: Able to explain program exercise prescription;Long Term: Able to explain home exercise prescription to exercise independently                Exercise Goals Re-Evaluation :    Discharge Exercise Prescription (Final Exercise Prescription Changes):   Nutrition:  Target Goals: Understanding  of nutrition guidelines, daily intake of sodium 1500mg , cholesterol 200mg , calories 30% from fat and 7% or less from saturated fats, daily to have 5 or more servings of fruits and vegetables.  Biometrics:  Pre Biometrics - 03/29/21 1430       Pre Biometrics   Waist Circumference 42.25 inches    Hip Circumference 42.25 inches    Waist to Hip Ratio 1 %    Triceps Skinfold 19 mm    % Body Fat 29.7 %    Grip Strength 46 kg    Flexibility 14.5 in    Single Leg Stand 3.24 seconds              Nutrition Therapy Plan and Nutrition Goals:   Nutrition Assessments:  MEDIFICTS Score Key: ?70 Need to make dietary changes  40-70 Heart Healthy Diet ? 40 Therapeutic Level Cholesterol Diet   Picture Your Plate Scores: <47<40 Unhealthy dietary pattern with much room for improvement. 41-50 Dietary pattern unlikely to meet recommendations for good health and room for improvement. 51-60 More healthful dietary pattern, with some room for improvement.  >60 Healthy dietary pattern, although there may be some specific behaviors that could be improved.    Nutrition Goals Re-Evaluation:   Nutrition Goals Discharge (Final Nutrition Goals Re-Evaluation):   Psychosocial: Target Goals: Acknowledge presence or absence of significant depression and/or stress, maximize coping skills, provide positive support system. Participant is able to verbalize types and ability to use techniques and skills needed for reducing stress and depression.  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 Yes   JR has his wife son and friend for support     Barriers   Psychosocial barriers to participate in program The patient should benefit from training in stress management and relaxation.      Screening Interventions   Interventions Encouraged to exercise;To provide support and resources with identified psychosocial needs;Provide feedback about the scores to participant    Expected Outcomes Long Term Goal: Stressors or current issues are controlled or eliminated.;Short Term goal: Identification and review with participant of any Quality of Life or Depression concerns found by scoring the questionnaire.;Long Term goal: The participant improves quality of Life and PHQ9 Scores as seen by post scores and/or verbalization of changes             Quality of Life Scores:  Quality of Life - 03/29/21 1620       Quality of Life   Select Quality of Life      Quality of Life Scores   Health/Function Pre 8.5 %    Socioeconomic Pre 11.94 %    Psych/Spiritual Pre 12.86 %    Family Pre 23.3 %    GLOBAL Pre 12.27 %            Scores of 19 and below usually indicate a poorer quality of life in these areas.  A difference of  2-3 points is a clinically meaningful difference.  A difference of 2-3 points in the total score of the Quality of Life Index has been associated with significant improvement in overall quality of life, self-image, physical symptoms, and general health in studies assessing change in quality of life.  PHQ-9: Recent Review Flowsheet Data     Depression screen [facility] 2/9 03/29/2021   Decreased Interest 0   Down, Depressed, Hopeless 0   PHQ - 2 Score 0      Interpretation of Total Score  Total Score Depression Severity:  1-4 = Minimal depression, 5-9 = Mild depression, 10-14 = Moderate depression, 15-19 = Moderately severe depression, 20-27 = Severe depression   Psychosocial Evaluation and Intervention:   Psychosocial  Re-Evaluation:   Psychosocial Discharge (Final Psychosocial Re-Evaluation):   Vocational Rehabilitation: Provide vocational rehab assistance to qualifying candidates.   Vocational Rehab Evaluation & Intervention:  Vocational Rehab - 03/29/21 1710       Initial Vocational Rehab Evaluation & Intervention   Assessment shows need for Vocational Rehabilitation No             Education: Education Goals: Education classes will be provided on a weekly basis, covering required topics. Participant will state understanding/return demonstration of topics presented.  Learning Barriers/Preferences:  Learning Barriers/Preferences - 03/29/21 1622       Learning Barriers/Preferences   Learning Barriers Reading;Sight    Learning Preferences Audio;Written Material;Computer/Internet;Group Instruction;Individual Instruction;Pictoral;Skilled Demonstration;Verbal Instruction             Education Topics: Hypertension, Hypertension Reduction -Define heart disease and high blood pressure. Discus how high blood pressure affects the body and ways to reduce high blood pressure.   Exercise and  Your Heart -Discuss why it is important to exercise, the FITT principles of exercise, normal and abnormal responses to exercise, and how to exercise safely.   Angina -Discuss definition of angina, causes of angina, treatment of angina, and how to decrease risk of having angina.   Cardiac Medications -Review what the following cardiac medications are used for, how they affect the body, and side effects that may occur when taking the medications.  Medications include Aspirin, Beta blockers, calcium channel blockers, ACE Inhibitors, angiotensin receptor blockers, diuretics, digoxin, and antihyperlipidemics.   Congestive Heart Failure -Discuss the definition of CHF, how to live with CHF, the signs and symptoms of CHF, and how keep track of weight and sodium intake.   Heart Disease and  Intimacy -Discus the effect sexual activity has on the heart, how changes occur during intimacy as we age, and safety during sexual activity.   Smoking Cessation / COPD -Discuss different methods to quit smoking, the health benefits of quitting smoking, and the definition of COPD.   Nutrition I: Fats -Discuss the types of cholesterol, what cholesterol does to the heart, and how cholesterol levels can be controlled.   Nutrition II: Labels -Discuss the different components of food labels and how to read food label   Heart Parts/Heart Disease and PAD -Discuss the anatomy of the heart, the pathway of blood circulation through the heart, and these are affected by heart disease.   Stress I: Signs and Symptoms -Discuss the causes of stress, how stress may lead to anxiety and depression, and ways to limit stress.   Stress II: Relaxation -Discuss different types of relaxation techniques to limit stress.   Warning Signs of Stroke / TIA -Discuss definition of a stroke, what the signs and symptoms are of a stroke, and how to identify when someone is having stroke.   Knowledge Questionnaire Score:  Knowledge Questionnaire Score - 03/29/21 1620       Knowledge Questionnaire Score   Pre Score 22/28             Core Components/Risk Factors/Patient Goals at Admission:  Personal Goals and Risk Factors at Admission - 03/29/21 1621       Core Components/Risk Factors/Patient Goals on Admission    Weight Management Yes;Weight Loss    Intervention Weight Management: Develop a combined nutrition and exercise program designed to reach desired caloric intake, while maintaining appropriate intake of nutrient and fiber, sodium and fats, and appropriate energy expenditure required for the weight goal.;Weight Management: Provide education and appropriate resources to help participant work on and attain dietary goals.;Weight Management/Obesity: Establish reasonable short term and long term weight  goals.    Admit Weight 195 lb 8.8 oz (88.7 kg)    Expected Outcomes Short Term: Continue to assess and modify interventions until short term weight is achieved;Long Term: Adherence to nutrition and physical activity/exercise program aimed toward attainment of established weight goal;Weight Maintenance: Understanding of the daily nutrition guidelines, which includes 25-35% calories from fat, 7% or less cal from saturated fats, less than 200mg  cholesterol, less than 1.5gm of sodium, & 5 or more servings of fruits and vegetables daily;Weight Loss: Understanding of general recommendations for a balanced deficit meal plan, which promotes 1-2 lb weight loss per week and includes a negative energy balance of [phone] kcal/d;Understanding recommendations for meals to include 15-35% energy as protein, 25-35% energy from fat, 35-60% energy from carbohydrates, less than 200mg  of dietary cholesterol, 20-35 gm of total fiber daily;Understanding of distribution of calorie intake throughout the  day with the consumption of 4-5 meals/snacks    Tobacco Cessation Yes    Number of packs per day .50    Intervention Assist the participant in steps to quit. Provide individualized education and counseling about committing to Tobacco Cessation, relapse prevention, and pharmacological support that can be provided by physician.;Education officer, environmental, assist with locating and accessing local/national Quit Smoking programs, and support quit date choice.    Expected Outcomes Short Term: Will demonstrate readiness to quit, by selecting a quit date.;Short Term: Will quit all tobacco product use, adhering to prevention of relapse plan.;Long Term: Complete abstinence from all tobacco products for at least 12 months from quit date.    Hypertension Yes    Intervention Provide education on lifestyle modifcations including regular physical activity/exercise, weight management, moderate sodium restriction and increased consumption of  fresh fruit, vegetables, and low fat dairy, alcohol moderation, and smoking cessation.;Monitor prescription use compliance.    Expected Outcomes Short Term: Continued assessment and intervention until BP is < 140/34mm HG in hypertensive participants. < 130/27mm HG in hypertensive participants with diabetes, heart failure or chronic kidney disease.;Long Term: Maintenance of blood pressure at goal levels.    Lipids Yes    Intervention Provide education and support for participant on nutrition & aerobic/resistive exercise along with prescribed medications to achieve LDL 70mg , HDL >40mg .    Expected Outcomes Short Term: Participant states understanding of desired cholesterol values and is compliant with medications prescribed. Participant is following exercise prescription and nutrition guidelines.;Long Term: Cholesterol controlled with medications as prescribed, with individualized exercise RX and with personalized nutrition plan. Value goals: LDL < 70mg , HDL > 40 mg.    Stress Yes    Intervention Offer individual and/or small group education and counseling on adjustment to heart disease, stress management and health-related lifestyle change. Teach and support self-help strategies.;Refer participants experiencing significant psychosocial distress to appropriate mental health specialists for further evaluation and treatment. When possible, include family members and significant others in education/counseling sessions.    Expected Outcomes Short Term: Participant demonstrates changes in health-related behavior, relaxation and other stress management skills, ability to obtain effective social support, and compliance with psychotropic medications if prescribed.             Core Components/Risk Factors/Patient Goals Review:    Core Components/Risk Factors/Patient Goals at Discharge (Final Review):    ITP Comments:  ITP Comments     Row Name 03/29/21 1707           ITP Comments Dr Armanda Magic  MD, Medical Director                Comments: Patient attended orientation on 03/29/2021 to review rules and guidelines for program.  Completed 6 minute walk test, Intitial ITP, and exercise prescription.  VSS. Telemetry-Sinus Rhythm, Bundle Branch Block.  Patyient reported mild shortness of breath otherwise Asymptomatic. Safety measures and social distancing in place per CDC guidelines. Please see previous documentation as patient mentioned having chest discomfort this past Sunday.Gladstone Lighter, RN,BSN 03/29/2021 5:24 PM

## 2021-03-29 NOTE — Progress Notes (Signed)
Cardiac Rehab Medication Review by a Nurse  Does the patient  feel that his/her medications are working for him/her?  Yes  Has the patient been experiencing any side effects to the medications prescribed?   NO  Does the patient measure his/her own blood pressure or blood glucose at home?  NO   Does the patient have any problems obtaining medications due to transportation or finances?    NO  Understanding of regimen: fair Understanding of indications: fair Potential of compliance: fair    Nurse comments: Patient is taking his medications as prescribed. Patient's wife helps. Patient does not check his blood pressures at home does not have a BP monitor. Not taking nicoderm patch says it gives him a rash.    Arta Bruce Marya Lowden RN 03/29/2021 5:25 PM

## 2021-03-29 NOTE — Telephone Encounter (Signed)
I called Maria back with Cardiac Rehab. She said patient came in today for first consultation for cardiac rehab. While talking to her he mentions having coming and going chest pains. He takes a nitroglycerin at least once a week. Most recently was on Sunday. She states that he did fine today no chest pains, but she wants to check with Dr.Acharya if she is okay with him continuing cardiac rehab. He comes back to start on Monday 07/11. I advised that Dr.Acharya was on vacation, and I could see what we could do to assist. Byrd Hesselbach sent over EKG and vitals from today they were faxed to the back fax. Will notify primary RN.  Thanks!

## 2021-03-29 NOTE — Progress Notes (Signed)
Patient reports that he had chest pain while at church this past Sunday. JR said that he took a sublingual nitroglycerin with relief. JR also report that he had nitroglycerin on 03/26/21 while visiting with his Son. Patient completed 6 minute walk test at cardiac rehab without complaints  of chest pain or symptoms. JR noted that he is on his second bottle of sublingual nitroglycerin. " I take about one sublingual nitroglycerin once a week. Dr Lupe Carney office called and notified. Will fax exercise flow sheets to Dr. Lupe Carney office for review with today's ECG tracings and walk test vital sign. Will get clearance for the patient before proceeding with exercise on 04/04/21.Gladstone Lighter, RN,BSN 03/29/2021 5:01 PM

## 2021-03-31 NOTE — Telephone Encounter (Signed)
I called Byrd Hesselbach, advised of message below from Dr.Acharya.  She verbalized understanding.

## 2021-04-04 ENCOUNTER — Encounter (HOSPITAL_COMMUNITY)
Admission: RE | Admit: 2021-04-04 | Discharge: 2021-04-04 | Disposition: A | Discharge status: Home / Self Care | Payer: Medicare Other | Source: Ambulatory Visit | Attending: Internal Medicine | Admitting: Internal Medicine

## 2021-04-04 ENCOUNTER — Other Ambulatory Visit: Payer: Self-pay

## 2021-04-04 DIAGNOSIS — Z955 Presence of coronary angioplasty implant and graft: Secondary | ICD-10-CM | POA: Diagnosis not present

## 2021-04-04 DIAGNOSIS — I214 Non-ST elevation (NSTEMI) myocardial infarction: Secondary | ICD-10-CM

## 2021-04-04 DIAGNOSIS — I444 Left anterior fascicular block: Secondary | ICD-10-CM | POA: Diagnosis not present

## 2021-04-04 DIAGNOSIS — I252 Old myocardial infarction: Secondary | ICD-10-CM | POA: Diagnosis not present

## 2021-04-04 NOTE — Progress Notes (Signed)
This was Jacob Ruiz's first exercise session for Cardiac rehab. He was oriented to the gym routine. VSS, normal rhythm, and good exercise response.  PHQ=0. He was very positive about the session. He did share he has had some feet and ankle swelling at times that has occurred since his heart attack. Noted +1 edema around both ankles, will continue to follow.

## 2021-04-06 ENCOUNTER — Encounter (HOSPITAL_COMMUNITY)
Admission: RE | Admit: 2021-04-06 | Discharge: 2021-04-06 | Disposition: A | Discharge status: Home / Self Care | Payer: Medicare Other | Source: Ambulatory Visit | Attending: Internal Medicine | Admitting: Internal Medicine

## 2021-04-06 ENCOUNTER — Other Ambulatory Visit: Payer: Self-pay

## 2021-04-06 DIAGNOSIS — Z955 Presence of coronary angioplasty implant and graft: Secondary | ICD-10-CM | POA: Diagnosis not present

## 2021-04-06 DIAGNOSIS — I444 Left anterior fascicular block: Secondary | ICD-10-CM | POA: Diagnosis not present

## 2021-04-06 DIAGNOSIS — I214 Non-ST elevation (NSTEMI) myocardial infarction: Secondary | ICD-10-CM

## 2021-04-06 DIAGNOSIS — I252 Old myocardial infarction: Secondary | ICD-10-CM | POA: Diagnosis not present

## 2021-04-06 NOTE — Progress Notes (Signed)
Tammi Klippel 75 y.o. male Nutrition Note  Diagnosis:  Past Medical History:  Diagnosis Date   Chronic bronchitis (HCC)    "get it just about q yr" (03/26/2014)   Coronary artery disease    H/O hiatal hernia    Hyperlipidemia      Medications reviewed.   Current Outpatient Medications:    amLODipine (NORVASC) 10 MG tablet, Take 1 tablet (10 mg total) by mouth daily., Disp: 90 tablet, Rfl: 3   aspirin EC 81 MG tablet, Take 81 mg by mouth daily. Swallow whole., Disp: , Rfl:    atorvastatin (LIPITOR) 40 MG tablet, Take 1 tablet (40 mg total) by mouth daily., Disp: 90 tablet, Rfl: 3   clopidogrel (PLAVIX) 75 MG tablet, Take 1 tablet (75 mg total) by mouth daily., Disp: 60 tablet, Rfl: 0   isosorbide mononitrate (IMDUR) 30 MG 24 hr tablet, Take 1 tablet (30 mg total) by mouth daily., Disp: 90 tablet, Rfl: 2   nicotine (NICODERM CQ - DOSED IN MG/24 HOURS) 21 mg/24hr patch, Place 1 patch (21 mg total) onto the skin daily. (Patient not taking: No sig reported), Disp: 28 patch, Rfl: 0   nitroGLYCERIN (NITROSTAT) 0.4 MG SL tablet, Place 1 tablet (0.4 mg total) under the tongue every 5 (five) minutes as needed for chest pain. YOU MAY TAKE (1) TABLET EVERY 5 MINS AS NEEDED FOR CHEST PAIN. DO NOT EXCEED 3 DOSES., Disp: 90 tablet, Rfl: 3   Ht Readings from Last 1 Encounters:  03/29/21 5\' 10"  (1.778 m)     Wt Readings from Last 3 Encounters:  03/29/21 195 lb 8.8 oz (88.7 kg)  03/11/21 192 lb 9.6 oz (87.4 kg)  01/17/21 191 lb 6.4 oz (86.8 kg)     There is no height or weight on file to calculate BMI.   Social History   Tobacco Use  Smoking Status Every Day   Packs/day: 0.50   Years: 54.00   Pack years: 27.00   Types: Cigarettes  Smokeless Tobacco Never  Tobacco Comments   Patient given the phone number 1-800-quit-now. Patient has no desire to quit     Lab Results  Component Value Date   CHOL 140 12/30/2020   Lab Results  Component Value Date   HDL 59 12/30/2020   Lab  Results  Component Value Date   LDLCALC 73 12/30/2020   Lab Results  Component Value Date   TRIG 39 12/30/2020     Lab Results  Component Value Date   HGBA1C 6.1 (H) 12/30/2020     CBG (last 3)  No results for input(s): GLUCAP in the last 72 hours.   Nutrition Note  Spoke with pt. Nutrition Plan and Nutrition Survey goals reviewed with pt. Pt is following a Heart Healthy diet. Pt wants to lose wt. He has not lost any weight yet. He identifies reducing snack/candy intake, choosing smaller portions, and increased activity as ways to lose weight.   Pt has Pre-diabetes. Last A1c indicates blood glucose well-controlled.   Pt eats out most days for breakfast or lunch. He eats a lighter dinner at home. He drinks black coffee, 8 oz water, and a soda occasionally. He does not add salt to foods. He does not read labels.   Diet recall: Breakfast: gravy and biscuit OR ham and cheese croissant OR bacon/egg/sausage/ plate OR shredded wheat at home, Coffee Lunch: Nabs/oatmeal cookie/coke Dinner: Chicken noodle soup and crackers OR tomato soup and grilled cheese, coffee  Pt normally eats 2 meals  and 1 snack each day. He typically eats one meal out and one meal at home.  He is interested in making diet changes. He feels stressed about having another MI. We discussed ways to improved diet.   Pt expressed understanding of the information reviewed.    Nutrition Diagnosis Food-and nutrition-related knowledge deficit related to lack of exposure to information as related to diagnosis of: ? CVD ? Pre-diabetes Nutrition Intervention Pt's individual nutrition plan reviewed with pt. Benefits of adopting Heart Healthy diet discussed when Picture Your Plate reviewed.  Continue client-centered nutrition education by RD, as part of interdisciplinary care.  Goal(s)  Pt to identify food quantities necessary to achieve weight loss of 6-24 lb at graduation from cardiac rehab.  Pt to build a healthy plate  including vegetables, fruits, whole grains, and low-fat dairy products in a heart healthy meal plan. Pt to reduce processed meats to 2 times per week   Plan:  Will provide client-centered nutrition education as part of interdisciplinary care Monitor and evaluate progress toward nutrition goal with team.   Andrey Campanile, MS, RDN, LDN

## 2021-04-08 ENCOUNTER — Encounter (HOSPITAL_COMMUNITY)
Admission: RE | Admit: 2021-04-08 | Discharge: 2021-04-08 | Disposition: A | Discharge status: Home / Self Care | Payer: Medicare Other | Source: Ambulatory Visit | Attending: Internal Medicine | Admitting: Internal Medicine

## 2021-04-08 ENCOUNTER — Other Ambulatory Visit: Payer: Self-pay

## 2021-04-08 DIAGNOSIS — I214 Non-ST elevation (NSTEMI) myocardial infarction: Secondary | ICD-10-CM

## 2021-04-08 DIAGNOSIS — I252 Old myocardial infarction: Secondary | ICD-10-CM | POA: Diagnosis not present

## 2021-04-08 DIAGNOSIS — I444 Left anterior fascicular block: Secondary | ICD-10-CM | POA: Diagnosis not present

## 2021-04-08 DIAGNOSIS — Z955 Presence of coronary angioplasty implant and graft: Secondary | ICD-10-CM

## 2021-04-11 ENCOUNTER — Telehealth: Payer: Self-pay | Admitting: Cardiology

## 2021-04-11 ENCOUNTER — Encounter (HOSPITAL_COMMUNITY)
Admission: RE | Admit: 2021-04-11 | Discharge: 2021-04-11 | Disposition: A | Discharge status: Home / Self Care | Payer: Medicare Other | Source: Ambulatory Visit | Attending: Internal Medicine | Admitting: Internal Medicine

## 2021-04-11 ENCOUNTER — Other Ambulatory Visit: Payer: Self-pay

## 2021-04-11 ENCOUNTER — Ambulatory Visit (HOSPITAL_COMMUNITY)
Admission: RE | Admit: 2021-04-11 | Discharge: 2021-04-11 | Disposition: A | Discharge status: Home / Self Care | Payer: Medicare Other | Source: Ambulatory Visit | Attending: Internal Medicine | Admitting: Internal Medicine

## 2021-04-11 DIAGNOSIS — I252 Old myocardial infarction: Secondary | ICD-10-CM | POA: Diagnosis not present

## 2021-04-11 DIAGNOSIS — Z955 Presence of coronary angioplasty implant and graft: Secondary | ICD-10-CM

## 2021-04-11 DIAGNOSIS — I444 Left anterior fascicular block: Secondary | ICD-10-CM | POA: Diagnosis not present

## 2021-04-11 DIAGNOSIS — I214 Non-ST elevation (NSTEMI) myocardial infarction: Secondary | ICD-10-CM

## 2021-04-11 NOTE — Progress Notes (Signed)
After completing the exercise session, went over Jacob Ruiz's Quality of Life Scores which at moments was emotional for [name] due the impact it has had on him not being able to work which he enjoyed and likes doing for others as well as sharing his experience with the initial onset of symptoms and going to the hospital. He shared he can already tell doing the exercise at Cardiac rehab is helping him. Within about 15 minutes of talking, he had onset of left sided chest pressure. He said stress brings it on. He took out his own Nitroglycerin and took 1 SL tab placing under tongue.  This Clinical research associate retrieved the oxygen and applied 4LNC, [name] EP took the blood pressure which was 150/90. Called for 12-Lead EKG. Chest pressure resolved within 5 minutes of NTG and oxygen application.  Called patient's wife to inform her. EKG completed and called Nada Boozer NP at 1037am to provide report and receive instructions.  Nada Boozer NP reviewed the 12-Lead and communicated may release patient to home.  Nada Boozer NP said she will call office to setup a follow-up appointment.  This Clinical research associate provided patient with instructions which included a review of taking NTG as well if onset of chest pain which does not resolve with the NTG to call 911 or call office if get relief and increase frequency of taking NTG. He was also informed the office will call him for a follow-up appointment. He verbalized understanding of the information provided. Recheck BP 140/80.  Escorted patient to vehicle with no return of chest pressure and he said he felt good.

## 2021-04-11 NOTE — Telephone Encounter (Signed)
Pt was tearful in cardiac rehab today, discussing social issues and had some mild chest pain, took 1 NTG and 02 with relief. EKG without acute changes.   Ok to go home and I will ask office to arrange appt with APP in 1-2 weeks.  Pt understands to go to ER if pain returns.   Or to call office if mild episodes.

## 2021-04-13 ENCOUNTER — Other Ambulatory Visit: Payer: Self-pay

## 2021-04-13 ENCOUNTER — Encounter (HOSPITAL_COMMUNITY)
Admission: RE | Admit: 2021-04-13 | Discharge: 2021-04-13 | Disposition: A | Discharge status: Home / Self Care | Payer: Medicare Other | Source: Ambulatory Visit | Attending: Internal Medicine | Admitting: Internal Medicine

## 2021-04-13 ENCOUNTER — Telehealth (HOSPITAL_COMMUNITY): Payer: Self-pay | Admitting: Family Medicine

## 2021-04-13 DIAGNOSIS — I252 Old myocardial infarction: Secondary | ICD-10-CM | POA: Diagnosis not present

## 2021-04-13 DIAGNOSIS — Z955 Presence of coronary angioplasty implant and graft: Secondary | ICD-10-CM | POA: Diagnosis not present

## 2021-04-13 DIAGNOSIS — I214 Non-ST elevation (NSTEMI) myocardial infarction: Secondary | ICD-10-CM

## 2021-04-13 DIAGNOSIS — I444 Left anterior fascicular block: Secondary | ICD-10-CM | POA: Diagnosis not present

## 2021-04-13 NOTE — Progress Notes (Signed)
Cardiac Individual Treatment Plan  Patient Details  Name: Jacob Ruiz MRN: [id number] Date of Birth: Feb 09, 1946 Referring Provider:   Flowsheet Row CARDIAC REHAB PHASE II ORIENTATION from 03/29/2021 in [facility] CARDIAC REHAB  Referring Provider Weston Brass, MD       Initial Encounter Date:  Flowsheet Row CARDIAC REHAB PHASE II ORIENTATION from 03/29/2021 in [facility] CARDIAC REHAB  Date 03/29/21       Visit Diagnosis: 12/29/20 NSTEMI   12/31/20 S/P DES LCX x 2   Patient's Home Medications on Admission:  Current Outpatient Medications:    amLODipine (NORVASC) 10 MG tablet, Take 1 tablet (10 mg total) by mouth daily., Disp: 90 tablet, Rfl: 3   aspirin EC 81 MG tablet, Take 81 mg by mouth daily. Swallow whole., Disp: , Rfl:    atorvastatin (LIPITOR) 40 MG tablet, Take 1 tablet (40 mg total) by mouth daily., Disp: 90 tablet, Rfl: 3   clopidogrel (PLAVIX) 75 MG tablet, Take 1 tablet (75 mg total) by mouth daily., Disp: 60 tablet, Rfl: 0   isosorbide mononitrate (IMDUR) 30 MG 24 hr tablet, Take 1 tablet (30 mg total) by mouth daily., Disp: 90 tablet, Rfl: 2   nicotine (NICODERM CQ - DOSED IN MG/24 HOURS) 21 mg/24hr patch, Place 1 patch (21 mg total) onto the skin daily. (Patient not taking: No sig reported), Disp: 28 patch, Rfl: 0   nitroGLYCERIN (NITROSTAT) 0.4 MG SL tablet, Place 1 tablet (0.4 mg total) under the tongue every 5 (five) minutes as needed for chest pain. YOU MAY TAKE (1) TABLET EVERY 5 MINS AS NEEDED FOR CHEST PAIN. DO NOT EXCEED 3 DOSES., Disp: 90 tablet, Rfl: 3  Past Medical History: Past Medical History:  Diagnosis Date   Chronic bronchitis (HCC)    "get it just about q yr" (03/26/2014)   Coronary artery disease    H/O hiatal hernia    Hyperlipidemia     Tobacco Use: Social History   Tobacco Use  Smoking Status Every Day   Packs/day: 0.50   Years: 54.00   Pack years: 27.00   Types: Cigarettes  Smokeless Tobacco  Never  Tobacco Comments   Patient given the phone number 1-800-quit-now. Patient has no desire to quit    Labs: Recent Review Flowsheet Data     Labs for ITP Cardiac and Pulmonary Rehab Latest Ref Rng & Units 02/01/2009 03/26/2014 11/07/2020 12/29/2020 12/30/2020   Cholestrol 0 - 200 mg/dL 409 - 811 - 914   LDLCALC 0 - 99 mg/dL 782(N) - 562(Z) - 73   LDLDIRECT mg/dL - - - - -   HDL >30 mg/dL 86.57 - 64 - 59   Trlycerides <150 mg/dL 84.6 - 76 - 39   Hemoglobin A1c 4.8 - 5.6 % - - - - 6.1(H)   TCO2 22 - 32 mmol/L - 28 - 26 -       Capillary Blood Glucose: Lab Results  Component Value Date   GLUCAP 145 (H) 12/29/2020     Exercise Target Goals: Exercise Program Goal: Individual exercise prescription set using results from initial 6 min walk test and THRR while considering  patient's activity barriers and safety.   Exercise Prescription Goal: Starting with aerobic activity 30 plus minutes a day, 3 days per week for initial exercise prescription. Provide home exercise prescription and guidelines that participant acknowledges understanding prior to discharge.  Activity Barriers & Risk Stratification:  Activity Barriers & Cardiac Risk Stratification - 03/29/21 1625  Activity Barriers & Cardiac Risk Stratification   Activity Barriers Back Problems;Neck/Spine Problems;Joint Problems;Shortness of Breath;Chest Pain/Angina;Balance Concerns;History of Falls    Cardiac Risk Stratification High             6 Minute Walk:  6 Minute Walk     Row Name 03/29/21 1542         6 Minute Walk   Phase Initial     Distance 1441 feet     Walk Time 6 minutes     # of Rest Breaks 0     MPH 2.73     METS 2.71     RPE 11     Perceived Dyspnea  1     VO2 Peak 9047     Symptoms Yes (comment)     Comments SOB, RPD = 1.  Denies pain     Resting HR 61 bpm     Resting BP 108/72     Resting Oxygen Saturation  94 %     Exercise Oxygen Saturation  during 6 min walk 95 %     Max Ex. HR 71  bpm     Max Ex. BP 140/70     2 Minute Post BP 114/62              Oxygen Initial Assessment:   Oxygen Re-Evaluation:   Oxygen Discharge (Final Oxygen Re-Evaluation):   Initial Exercise Prescription:  Initial Exercise Prescription - 03/29/21 1600       Date of Initial Exercise RX and Referring Provider   Date 03/29/21    Referring Provider Weston Brass, MD    Expected Discharge Date 05/27/21      NuStep   Level 2    SPM 75    Minutes 15    METs 2      Arm Ergometer   Level 2    Minutes 15    METs 2      Prescription Details   Frequency (times per week) 3    Duration Progress to 30 minutes of continuous aerobic without signs/symptoms of physical distress      Intensity   THRR 40-80% of Max Heartrate 58-117    Ratings of Perceived Exertion 11-13    Perceived Dyspnea 0-4      Progression   Progression Continue progressive overload as per policy without signs/symptoms or physical distress.      Resistance Training   Training Prescription Yes    Weight 4 lbs    Reps 10-15             Perform Capillary Blood Glucose checks as needed.  Exercise Prescription Changes:   Exercise Prescription Changes     Row Name 04/04/21 1000             Response to Exercise   Blood Pressure (Admit) 144/70       Blood Pressure (Exercise) 154/72       Blood Pressure (Exit) 114/66       Heart Rate (Admit) 66 bpm       Heart Rate (Exercise) 88 bpm       Heart Rate (Exit) 65 bpm       Rating of Perceived Exertion (Exercise) 11       Symptoms None       Comments Pt's first day in the CRP2 program       Duration Progress to 30 minutes of  aerobic without signs/symptoms of physical distress       Intensity  THRR unchanged               Progression     Progression Continue to progress workloads to maintain intensity without signs/symptoms of physical distress.       Average METs 2               Resistance Training     Training Prescription Yes        Weight 4 lbs       Reps 10-15       Time 10 Minutes               NuStep     Level 2       SPM 75       Minutes 15       METs 2               Arm Ergometer     Level 3       Watts 8       Minutes 15               Exercise Comments:   Exercise Comments     Row Name 04/04/21 1022           Exercise Comments Pt's first day in the CRP2 program. Pt tolerated session well and is off to a good start.                Exercise Goals and Review:   Exercise Goals     Row Name 03/29/21 1625             Exercise Goals   Increase Physical Activity Yes       Intervention Provide advice, education, support and counseling about physical activity/exercise needs.;Develop an individualized exercise prescription for aerobic and resistive training based on initial evaluation findings, risk stratification, comorbidities and participant's personal goals.       Expected Outcomes Short Term: Attend rehab on a regular basis to increase amount of physical activity.;Long Term: Add in home exercise to make exercise part of routine and to increase amount of physical activity.;Long Term: Exercising regularly at least 3-5 days a week.       Increase Strength and Stamina Yes       Intervention Provide advice, education, support and counseling about physical activity/exercise needs.;Develop an individualized exercise prescription for aerobic and resistive training based on initial evaluation findings, risk stratification, comorbidities and participant's personal goals.       Expected Outcomes Short Term: Increase workloads from initial exercise prescription for resistance, speed, and METs.;Short Term: Perform resistance training exercises routinely during rehab and add in resistance training at home;Long Term: Improve cardiorespiratory fitness, muscular endurance and strength as measured by increased METs and functional capacity (6MWT)       Able to understand and use rate of perceived exertion  (RPE) scale Yes       Intervention Provide education and explanation on how to use RPE scale       Expected Outcomes Short Term: Able to use RPE daily in rehab to express subjective intensity level;Long Term:  Able to use RPE to guide intensity level when exercising independently       Knowledge and understanding of Target Heart Rate Range (THRR) Yes       Intervention Provide education and explanation of THRR including how the numbers were predicted and where they are located for reference       Expected Outcomes Short Term: Able to state/look  up THRR;Short Term: Able to use daily as guideline for intensity in rehab;Long Term: Able to use THRR to govern intensity when exercising independently       Understanding of Exercise Prescription Yes       Intervention Provide education, explanation, and written materials on patient's individual exercise prescription       Expected Outcomes Short Term: Able to explain program exercise prescription;Long Term: Able to explain home exercise prescription to exercise independently                Exercise Goals Re-Evaluation :  Exercise Goals Re-Evaluation     Row Name 04/04/21 1021             Exercise Goal Re-Evaluation   Exercise Goals Review Increase Physical Activity;Increase Strength and Stamina;Able to understand and use rate of perceived exertion (RPE) scale;Knowledge and understanding of Target Heart Rate Range (THRR);Understanding of Exercise Prescription       Comments Pt's first day in the CRP2 program. Pt understands the exercise Rx, THRR, and RPE scale.       Expected Outcomes Will continue to monitor patient and increase exercise workloads as tolerated.                 Discharge Exercise Prescription (Final Exercise Prescription Changes):  Exercise Prescription Changes - 04/04/21 1000       Response to Exercise   Blood Pressure (Admit) 144/70    Blood Pressure (Exercise) 154/72    Blood Pressure (Exit) 114/66    Heart  Rate (Admit) 66 bpm    Heart Rate (Exercise) 88 bpm    Heart Rate (Exit) 65 bpm    Rating of Perceived Exertion (Exercise) 11    Symptoms None    Comments Pt's first day in the CRP2 program    Duration Progress to 30 minutes of  aerobic without signs/symptoms of physical distress    Intensity THRR unchanged      Progression   Progression Continue to progress workloads to maintain intensity without signs/symptoms of physical distress.    Average METs 2      Resistance Training   Training Prescription Yes    Weight 4 lbs    Reps 10-15    Time 10 Minutes      NuStep   Level 2    SPM 75    Minutes 15    METs 2      Arm Ergometer   Level 3    Watts 8    Minutes 15             Nutrition:  Target Goals: Understanding of nutrition guidelines, daily intake of sodium 1500mg , cholesterol 200mg , calories 30% from fat and 7% or less from saturated fats, daily to have 5 or more servings of fruits and vegetables.  Biometrics:  Pre Biometrics - 03/29/21 1430       Pre Biometrics   Waist Circumference 42.25 inches    Hip Circumference 42.25 inches    Waist to Hip Ratio 1 %    Triceps Skinfold 19 mm    % Body Fat 29.7 %    Grip Strength 46 kg    Flexibility 14.5 in    Single Leg Stand 3.24 seconds              Nutrition Therapy Plan and Nutrition Goals:  Nutrition Therapy & Goals - 04/06/21 1044       Nutrition Therapy   Diet TLC    Drug/Food Interactions Statins/Certain  Fruits      Personal Nutrition Goals   Nutrition Goal Pt to identify food quantities necessary to achieve weight loss of 6-24 lb at graduation from cardiac rehab.    Personal Goal #2 Pt to build a healthy plate including vegetables, fruits, whole grains, and low-fat dairy products in a heart healthy meal plan.    Personal Goal #3 Pt to reduce processed meats to 2 times per week      Intervention Plan   Intervention Prescribe, educate and counsel regarding individualized specific dietary  modifications aiming towards targeted core components such as weight, hypertension, lipid management, diabetes, heart failure and other comorbidities.;Nutrition handout(s) given to patient.    Expected Outcomes Short Term Goal: A plan has been developed with personal nutrition goals set during dietitian appointment.;Long Term Goal: Adherence to prescribed nutrition plan.             Nutrition Assessments:  MEDIFICTS Score Key: ?70 Need to make dietary changes  40-70 Heart Healthy Diet ? 40 Therapeutic Level Cholesterol Diet  Flowsheet Row CARDIAC REHAB PHASE II EXERCISE from 04/06/2021 in [facility] CARDIAC REHAB  Picture Your Plate Total Score on Admission 52      Picture Your Plate Scores: <16 Unhealthy dietary pattern with much room for improvement. 41-50 Dietary pattern unlikely to meet recommendations for good health and room for improvement. 51-60 More healthful dietary pattern, with some room for improvement.  >60 Healthy dietary pattern, although there may be some specific behaviors that could be improved.    Nutrition Goals Re-Evaluation:  Nutrition Goals Re-Evaluation     Row Name 04/06/21 1044             Goals   Current Weight 195 lb (88.5 kg)                Nutrition Goals Discharge (Final Nutrition Goals Re-Evaluation):  Nutrition Goals Re-Evaluation - 04/06/21 1044       Goals   Current Weight 195 lb (88.5 kg)             Psychosocial: Target Goals: Acknowledge presence or absence of significant depression and/or stress, maximize coping skills, provide positive support system. Participant is able to verbalize types and ability to use techniques and skills needed for reducing stress and depression.  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 Yes   JR has his wife son and friend for support     Barriers   Psychosocial barriers to participate in program The patient should benefit from training in stress management and relaxation.      Screening Interventions   Interventions Encouraged to exercise;To provide support and resources with identified psychosocial needs;Provide feedback about the scores to participant    Expected Outcomes Long Term Goal: Stressors or current issues are controlled or eliminated.;Short Term goal: Identification and review with participant of any Quality of Life or Depression concerns found by scoring the questionnaire.;Long Term goal: The participant improves quality of Life and PHQ9 Scores as seen by  post scores and/or verbalization of changes             Quality of Life Scores:  Quality of Life - 03/29/21 1620       Quality of Life   Select Quality of Life      Quality of Life Scores   Health/Function Pre 8.5 %    Socioeconomic Pre 11.94 %    Psych/Spiritual Pre 12.86 %    Family Pre 23.3 %    GLOBAL Pre 12.27 %            Scores of 19 and below usually indicate a poorer quality of life in these areas.  A difference of  2-3 points is a clinically meaningful difference.  A difference of 2-3 points in the total score of the Quality of Life Index has been associated with significant improvement in overall quality of life, self-image, physical symptoms, and general health in studies assessing change in quality of life.  PHQ-9: Recent Review Flowsheet Data     Depression screen [facility] 2/9 03/29/2021   Decreased Interest 0   Down, Depressed, Hopeless 0   PHQ - 2 Score 0      Interpretation of Total Score  Total Score Depression  Severity:  1-4 = Minimal depression, 5-9 = Mild depression, 10-14 = Moderate depression, 15-19 = Moderately severe depression, 20-27 = Severe depression   Psychosocial Evaluation and Intervention:   Psychosocial Re-Evaluation:  Psychosocial Re-Evaluation     Row Name 04/13/21 1832             Psychosocial Re-Evaluation   Current issues with History of Depression;Current Stress Concerns       Comments 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 Quality of life reviewed with patient and wife on 03/29/21       Expected Outcomes JR will have decreased stress upon completion of phase 2 cardiac rehb       Interventions Stress management education;Encouraged to attend Cardiac Rehabilitation for the exercise       Continue Psychosocial Services  Follow up required by staff               Initial Review     Source of Stress Concerns Financial;Poor Coping Skills;Unable to perform yard/household activities;Unable to participate in former interests or hobbies       Comments Will continue to monitor and offer support as needed               Psychosocial Discharge (Final Psychosocial Re-Evaluation):  Psychosocial Re-Evaluation - 04/13/21 1832       Psychosocial Re-Evaluation   Current issues with History of Depression;Current Stress Concerns    Comments 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 Quality of life reviewed with patient and wife on 03/29/21    Expected Outcomes JR will have decreased stress upon completion of phase 2 cardiac rehb    Interventions Stress management education;Encouraged to attend Cardiac Rehabilitation for the exercise    Continue Psychosocial Services  Follow up required by staff      Initial Review   Source of Stress Concerns Financial;Poor Coping Skills;Unable to perform yard/household activities;Unable to participate in former interests or hobbies    Comments Will continue to monitor and offer support as needed              Vocational Rehabilitation: Provide vocational rehab assistance to qualifying candidates.   Vocational Rehab Evaluation & Intervention:  Vocational Rehab - 03/29/21  1710       Initial Vocational Rehab Evaluation & Intervention   Assessment shows need for Vocational Rehabilitation No             Education: Education Goals: Education classes will be provided on a weekly basis, covering required topics. Participant will state understanding/return demonstration of topics presented.  Learning Barriers/Preferences:  Learning Barriers/Preferences - 03/29/21 1622       Learning Barriers/Preferences   Learning Barriers Reading;Sight    Learning Preferences Audio;Written Material;Computer/Internet;Group Instruction;Individual Instruction;Pictoral;Skilled Demonstration;Verbal Instruction             Education Topics: Hypertension, Hypertension Reduction -Define heart disease and high blood pressure. Discus how high blood pressure affects the body and ways to reduce high blood pressure.   Exercise and Your Heart -Discuss why it is important to exercise, the FITT principles of exercise, normal and abnormal responses to exercise, and how to exercise safely.   Angina -Discuss definition of angina, causes of angina, treatment of angina, and how to decrease risk of having angina.   Cardiac Medications -Review what the following cardiac medications are used for, how they affect the body, and side effects that may occur when taking the medications.  Medications include Aspirin, Beta blockers, calcium channel blockers, ACE Inhibitors, angiotensin receptor blockers, diuretics, digoxin, and antihyperlipidemics.   Congestive Heart Failure -Discuss the definition of CHF, how to live with CHF, the signs and symptoms of CHF, and how keep track of weight and sodium intake.   Heart Disease and Intimacy -Discus the effect sexual activity has on the heart, how changes occur during  intimacy as we age, and safety during sexual activity.   Smoking Cessation / COPD -Discuss different methods to quit smoking, the health benefits of quitting smoking, and the definition of COPD.   Nutrition I: Fats -Discuss the types of cholesterol, what cholesterol does to the heart, and how cholesterol levels can be controlled.   Nutrition II: Labels -Discuss the different components of food labels and how to read food label   Heart Parts/Heart Disease and PAD -Discuss the anatomy of the heart, the pathway of blood circulation through the heart, and these are affected by heart disease.   Stress I: Signs and Symptoms -Discuss the causes of stress, how stress may lead to anxiety and depression, and ways to limit stress.   Stress II: Relaxation -Discuss different types of relaxation techniques to limit stress.   Warning Signs of Stroke / TIA -Discuss definition of a stroke, what the signs and symptoms are of a stroke, and how to identify when someone is having stroke.   Knowledge Questionnaire Score:  Knowledge Questionnaire Score - 03/29/21 1620       Knowledge Questionnaire Score   Pre Score 22/28             Core Components/Risk Factors/Patient Goals at Admission:  Personal Goals and Risk Factors at Admission - 03/29/21 1621       Core Components/Risk Factors/Patient Goals on Admission    Weight Management Yes;Weight Loss    Intervention Weight Management: Develop a combined nutrition and exercise program designed to reach desired caloric intake, while maintaining appropriate intake of nutrient and fiber, sodium and fats, and appropriate energy expenditure required for the weight goal.;Weight Management: Provide education and appropriate resources to help participant work on and attain dietary goals.;Weight Management/Obesity: Establish reasonable short term and long term weight goals.    Admit Weight 195 lb 8.8 oz (88.7 kg)    Expected Outcomes  Short Term:  Continue to assess and modify interventions until short term weight is achieved;Long Term: Adherence to nutrition and physical activity/exercise program aimed toward attainment of established weight goal;Weight Maintenance: Understanding of the daily nutrition guidelines, which includes 25-35% calories from fat, 7% or less cal from saturated fats, less than 200mg  cholesterol, less than 1.5gm of sodium, & 5 or more servings of fruits and vegetables daily;Weight Loss: Understanding of general recommendations for a balanced deficit meal plan, which promotes 1-2 lb weight loss per week and includes a negative energy balance of [phone] kcal/d;Understanding recommendations for meals to include 15-35% energy as protein, 25-35% energy from fat, 35-60% energy from carbohydrates, less than 200mg  of dietary cholesterol, 20-35 gm of total fiber daily;Understanding of distribution of calorie intake throughout the day with the consumption of 4-5 meals/snacks    Tobacco Cessation Yes    Number of packs per day .50    Intervention Assist the participant in steps to quit. Provide individualized education and counseling about committing to Tobacco Cessation, relapse prevention, and pharmacological support that can be provided by physician.; , assist with locating and accessing local/national Quit Smoking programs, and support quit date choice.    Expected Outcomes Short Term: Will demonstrate readiness to quit, by selecting a quit date.;Short Term: Will quit all tobacco product use, adhering to prevention of relapse plan.;Long Term: Complete abstinence from all tobacco products for at least 12 months from quit date.    Hypertension Yes    Intervention Provide education on lifestyle modifcations including regular physical activity/exercise, weight management, moderate sodium restriction and increased consumption of fresh fruit, vegetables, and low fat dairy, alcohol moderation, and smoking  cessation.;Monitor prescription use compliance.    Expected Outcomes Short Term: Continued assessment and intervention until BP is < 140/59mm HG in hypertensive participants. < 130/69mm HG in hypertensive participants with diabetes, heart failure or chronic kidney disease.;Long Term: Maintenance of blood pressure at goal levels.    Lipids Yes    Intervention Provide education and support for participant on nutrition & aerobic/resistive exercise along with prescribed medications to achieve LDL 70mg , HDL >40mg .    Expected Outcomes Short Term: Participant states understanding of desired cholesterol values and is compliant with medications prescribed. Participant is following exercise prescription and nutrition guidelines.;Long Term: Cholesterol controlled with medications as prescribed, with individualized exercise RX and with personalized nutrition plan. Value goals: LDL < 70mg , HDL > 40 mg.    Stress Yes    Intervention Offer individual and/or small group education and counseling on adjustment to heart disease, stress management and health-related lifestyle change. Teach and support self-help strategies.;Refer participants experiencing significant psychosocial distress to appropriate mental health specialists for further evaluation and treatment. When possible, include family members and significant others in education/counseling sessions.    Expected Outcomes Short Term: Participant demonstrates changes in health-related behavior, relaxation and other stress management skills, ability to obtain effective social support, and compliance with psychotropic medications if prescribed.             Core Components/Risk Factors/Patient Goals Review:   Goals and Risk Factor Review     Row Name 04/13/21 1836             Core Components/Risk Factors/Patient Goals Review   Personal Goals Review Weight Management/Obesity;Stress;Lipids;Hypertension;Tobacco Cessation       Review JR has been doing well  with exercise at cardiac rehab. Vital sings have been stable will continue to encourage smoking cessation       Expected  Outcomes JR will continue to partcipate in phase 2 cardiac rehab for exercise, nutrtion and lifestyle modifications                Core Components/Risk Factors/Patient Goals at Discharge (Final Review):   Goals and Risk Factor Review - 04/13/21 1836       Core Components/Risk Factors/Patient Goals Review   Personal Goals Review Weight Management/Obesity;Stress;Lipids;Hypertension;Tobacco Cessation    Review JR has been doing well with exercise at cardiac rehab. Vital sings have been stable will continue to encourage smoking cessation    Expected Outcomes JR will continue to partcipate in phase 2 cardiac rehab for exercise, nutrtion and lifestyle modifications             ITP Comments:  ITP Comments     Row Name 03/29/21 1707 04/13/21 1831         ITP Comments Dr Armanda Magic MD, Medical Director 30 Day ITP Review. JR has good attendance and partcipation in phase 2 cardiac rehab. Will continue to monitor for s/s/of Angina               Comments: See ITP comments.Gladstone Lighter, RN,BSN 04/13/2021 6:39 PM

## 2021-04-13 NOTE — Progress Notes (Signed)
Nutrition Note Follow up  Spoke with pt. Reviewed goals. Pt successful with goals for a healthier breakfast. He has further questions about what he should eat. Provided him with suggestions on further changes for breakfast and lunch. He is motivated to continue working on goals towards eating and understanding a healthier diet.  Nutrition Diagnosis  Food-and nutrition-related knowledge deficit related to lack of exposure to information as related to diagnosis of: ? CVD ? Pre-diabetes Nutrition Intervention  Pt's individual nutrition plan reviewed with pt. Benefits of adopting Heart Healthy diet discussed when Picture Your Plate reviewed.       Continue client-centered nutrition education by RD, as part of interdisciplinary care.   Goal(s)   Pt to identify food quantities necessary to achieve weight loss of 6-24 lb at graduation from cardiac rehab. Pt to build a healthy plate including vegetables, fruits, whole grains, and low-fat dairy products in a heart healthy meal plan. Pt to reduce processed meats to 2 times per week    Plan:  Will provide client-centered nutrition education as part of interdisciplinary care Monitor and evaluate progress toward nutrition goal with team.     Andrey Campanile, MS, RDN, LDN

## 2021-04-14 ENCOUNTER — Telehealth: Payer: Self-pay

## 2021-04-14 NOTE — Telephone Encounter (Signed)
Was returning call and stated that he was doing good with the 4 medication and doesn't want any new one amLODipine (NORVASC) 10 MG tablet atorvastatin (LIPITOR) 40 MG tablet (Expired) clopidogrel (PLAVIX) 75 MG tablet isosorbide mononitrate (IMDUR) 30 MG 24 hr tablet

## 2021-04-14 NOTE — Telephone Encounter (Signed)
Attempted to contact pt. He state his currently at a restaurant and will call back later today.

## 2021-04-14 NOTE — Telephone Encounter (Signed)
Encounter not needed

## 2021-04-14 NOTE — Telephone Encounter (Signed)
Parke Poisson, MD  Baird Cancer, RN Increase his imdur to 60 mg daily and let me see him on Aug 31.  [place]   Attempted to contact patient regarding the above message. Patient's wife states that patient is not home at this time but will have patient call back to the office when he is available.

## 2021-04-15 ENCOUNTER — Encounter (HOSPITAL_COMMUNITY)
Admission: RE | Admit: 2021-04-15 | Discharge: 2021-04-15 | Disposition: A | Discharge status: Home / Self Care | Payer: Medicare Other | Source: Ambulatory Visit | Attending: Internal Medicine | Admitting: Internal Medicine

## 2021-04-15 ENCOUNTER — Other Ambulatory Visit: Payer: Self-pay

## 2021-04-15 DIAGNOSIS — I252 Old myocardial infarction: Secondary | ICD-10-CM | POA: Diagnosis not present

## 2021-04-15 DIAGNOSIS — Z955 Presence of coronary angioplasty implant and graft: Secondary | ICD-10-CM

## 2021-04-15 DIAGNOSIS — I214 Non-ST elevation (NSTEMI) myocardial infarction: Secondary | ICD-10-CM

## 2021-04-15 DIAGNOSIS — I444 Left anterior fascicular block: Secondary | ICD-10-CM | POA: Diagnosis not present

## 2021-04-15 NOTE — Telephone Encounter (Signed)
Lm to call back ./cy 

## 2021-04-18 ENCOUNTER — Telehealth (HOSPITAL_COMMUNITY): Payer: Self-pay | Admitting: Family Medicine

## 2021-04-18 ENCOUNTER — Encounter (HOSPITAL_COMMUNITY): Payer: Medicare Other

## 2021-04-18 NOTE — Telephone Encounter (Signed)
Returned call to patient, made patient aware of Dr. Lupe Carney recommendations. Patient does not wish to increase him Imdur at this time. Patient states that he feels well and has not had the chest pain in over a week and would like to continue medications at current dosage. Made patient an appointment to see Dr. Jacques Navy 8/30 at 8:20am in office. Advised patient that I would forward message to Dr. Jacques Navy to make her aware.   Made patient aware of ED precautions should new or worsening symptoms develop. Patient verbalized understanding.  Advised patient to call back to office with any issues, questions, or concerns. Patient verbalized understanding.

## 2021-04-20 ENCOUNTER — Telehealth (HOSPITAL_COMMUNITY): Payer: Self-pay | Admitting: Family Medicine

## 2021-04-20 ENCOUNTER — Telehealth (HOSPITAL_COMMUNITY): Payer: Self-pay | Admitting: *Deleted

## 2021-04-20 ENCOUNTER — Encounter (HOSPITAL_COMMUNITY): Payer: Medicare Other

## 2021-04-20 NOTE — Telephone Encounter (Signed)
Spoke with JR, he plans to return to exercise on Friday.Gladstone Lighter, RN,BSN 04/20/2021 9:40 AM

## 2021-04-20 NOTE — Telephone Encounter (Addendum)
September appointment has been cancelled.   Parke Poisson, MD  You; Elenor Quinones L 2 days ago    [facility]. He has two appointments scheduled. Please cancel the one with angie duke 9/20 to free up the appt for another patient since he has one with me 8/30 which he should keep

## 2021-04-22 ENCOUNTER — Encounter (HOSPITAL_COMMUNITY)
Admission: RE | Admit: 2021-04-22 | Discharge: 2021-04-22 | Disposition: A | Discharge status: Home / Self Care | Payer: Medicare Other | Source: Ambulatory Visit | Attending: Internal Medicine | Admitting: Internal Medicine

## 2021-04-22 ENCOUNTER — Other Ambulatory Visit: Payer: Self-pay

## 2021-04-22 DIAGNOSIS — I252 Old myocardial infarction: Secondary | ICD-10-CM | POA: Diagnosis not present

## 2021-04-22 DIAGNOSIS — I214 Non-ST elevation (NSTEMI) myocardial infarction: Secondary | ICD-10-CM

## 2021-04-22 DIAGNOSIS — I444 Left anterior fascicular block: Secondary | ICD-10-CM | POA: Diagnosis not present

## 2021-04-22 DIAGNOSIS — Z955 Presence of coronary angioplasty implant and graft: Secondary | ICD-10-CM | POA: Diagnosis not present

## 2021-04-24 ENCOUNTER — Other Ambulatory Visit: Payer: Self-pay

## 2021-04-24 ENCOUNTER — Emergency Department (HOSPITAL_COMMUNITY)
Admission: EM | Admit: 2021-04-24 | Discharge: 2021-04-24 | Disposition: A | Discharge status: Home / Self Care | Payer: Medicare Other | Attending: Emergency Medicine | Admitting: Emergency Medicine

## 2021-04-24 ENCOUNTER — Emergency Department (HOSPITAL_COMMUNITY): Payer: Medicare Other

## 2021-04-24 ENCOUNTER — Encounter (HOSPITAL_COMMUNITY): Payer: Self-pay | Admitting: Emergency Medicine

## 2021-04-24 DIAGNOSIS — R21 Rash and other nonspecific skin eruption: Secondary | ICD-10-CM | POA: Insufficient documentation

## 2021-04-24 DIAGNOSIS — F17210 Nicotine dependence, cigarettes, uncomplicated: Secondary | ICD-10-CM | POA: Insufficient documentation

## 2021-04-24 DIAGNOSIS — R001 Bradycardia, unspecified: Secondary | ICD-10-CM | POA: Insufficient documentation

## 2021-04-24 DIAGNOSIS — I10 Essential (primary) hypertension: Secondary | ICD-10-CM | POA: Insufficient documentation

## 2021-04-24 DIAGNOSIS — Z79899 Other long term (current) drug therapy: Secondary | ICD-10-CM | POA: Insufficient documentation

## 2021-04-24 DIAGNOSIS — Z7902 Long term (current) use of antithrombotics/antiplatelets: Secondary | ICD-10-CM | POA: Insufficient documentation

## 2021-04-24 DIAGNOSIS — Z7982 Long term (current) use of aspirin: Secondary | ICD-10-CM | POA: Diagnosis not present

## 2021-04-24 DIAGNOSIS — R0602 Shortness of breath: Secondary | ICD-10-CM | POA: Diagnosis not present

## 2021-04-24 DIAGNOSIS — R0789 Other chest pain: Secondary | ICD-10-CM | POA: Diagnosis not present

## 2021-04-24 DIAGNOSIS — R079 Chest pain, unspecified: Secondary | ICD-10-CM | POA: Diagnosis not present

## 2021-04-24 LAB — CBC
HCT: 43.8 % (ref 39.0–52.0)
Hemoglobin: 14.7 g/dL (ref 13.0–17.0)
MCH: 31.1 pg (ref 26.0–34.0)
MCHC: 33.6 g/dL (ref 30.0–36.0)
MCV: 92.6 fL (ref 80.0–100.0)
Platelets: 203 10*3/uL (ref 150–400)
RBC: 4.73 MIL/uL (ref 4.22–5.81)
RDW: 12.5 % (ref 11.5–15.5)
WBC: 6 10*3/uL (ref 4.0–10.5)
nRBC: 0 % (ref 0.0–0.2)

## 2021-04-24 LAB — TROPONIN I (HIGH SENSITIVITY)
Troponin I (High Sensitivity): 6 ng/L (ref <18)
Troponin I (High Sensitivity): 5 ng/L (ref <18)

## 2021-04-24 LAB — BASIC METABOLIC PANEL
Anion gap: 7 (ref 5–15)
BUN: 8 mg/dL (ref 8–23)
CO2: 30 mmol/L (ref 22–32)
Calcium: 9.1 mg/dL (ref 8.9–10.3)
Chloride: 102 mmol/L (ref 98–111)
Creatinine, Ser: 0.69 mg/dL (ref 0.61–1.24)
GFR, Estimated: >60 mL/min (ref >60)
Glucose, Bld: 115 mg/dL — ABNORMAL HIGH (ref 70–99)
Potassium: 4 mmol/L (ref 3.5–5.1)
Sodium: 139 mmol/L (ref 135–145)

## 2021-04-24 MED ORDER — CETIRIZINE HCL 10 MG PO TABS: 10.0000 mg | ORAL_TABLET | Freq: Every day | ORAL | 0 refills | Status: AC | PRN

## 2021-04-24 MED ORDER — CETIRIZINE HCL 10 MG PO TABS: 10.0000 mg | ORAL_TABLET | Freq: Every day | ORAL | 0 refills | Status: DC | PRN

## 2021-04-24 MED ORDER — DIPHENHYDRAMINE HCL 25 MG PO CAPS
25.0000 mg | ORAL_CAPSULE | Freq: Once | ORAL | Status: AC
  Administered 2021-04-24: 25 mg via ORAL
  Filled 2021-04-24: qty 1

## 2021-04-24 MED ORDER — PREDNISONE 10 MG PO TABS: ORAL_TABLET | ORAL | 0 refills | Status: AC

## 2021-04-24 MED ORDER — PREDNISONE 10 MG PO TABS: ORAL_TABLET | ORAL | 0 refills | Status: DC

## 2021-04-24 NOTE — ED Triage Notes (Signed)
Pt states he bent over to put on his sock around 12:30pm yesterday and felt like "stomach was pushing into heart".  States pain lasted approx 6 min and was relieved with 1 NTG.  Reports SOB.    Pt states he went into woods yesterday to dig up a tree and ever since he has had itchy bumps mostly around ankles but also arms and legs.  States he has used anti-itch cream without relief and has been up all night scratching.

## 2021-04-24 NOTE — ED Provider Notes (Signed)
[facility] EMERGENCY DEPARTMENT Provider Note   CSN: [id number] Arrival date & time: 04/24/21  4163     History Chief Complaint  Patient presents with   Rash    Jacob Ruiz is a 75 y.o. male with a hx of CAD S/p PCI, hyperlipidemia, & prior TIA who presents to the ED with chief complaint of a rash that started a couple of days ago. Patient states that he was out in the woods a couple days prior digging up a tree in the weeds and later that day developed an itching rash to the upper legs that then spread to the diffuse upper/lower extremities & trunk. One of the areas blistered up and he popped it. It is extremely itchy, he has tried topical neosporin & benadryl without relief. He has not had any oral/facial swelling or dysphagia. States he gets short of breath at night at times but this has been occurring for a few months, no other dyspnea. He also mentions that yesterday he had an episode of chest discomfort. States he bent over to put his socks on and felt like his stomach hit his heart causing pain. Took a nitroglycerin and pain continued for another 5 minutes prior to resolving. He has not had any reoccurrence since. He did have stent placed in April of this year. He did not have any chest pain when digging up the tree the other day. He denies fever, nausea, vomiting, or syncope. Denies recent new medications or removal of ticks.   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   URGE INCONTINENCE 10/26/2009   BENIGN PROSTATIC HYPERTROPHY, HX OF 10/26/2009   Elevated blood pressure 10/20/2006    Past Surgical History:  Procedure Laterality Date   CARDIAC CATHETERIZATION     CORONARY STENT INTERVENTION N/A 12/31/2020   Procedure: CORONARY STENT INTERVENTION;  Surgeon: Yvonne Kendall, MD;  Location: [facility] INVASIVE CV LAB;  Service: Cardiovascular;  Laterality: N/A;   KNEE ARTHROSCOPY Right 05/26/1969   LEFT HEART CATH AND CORONARY ANGIOGRAPHY N/A 12/31/2020   Procedure: LEFT HEART CATH AND CORONARY ANGIOGRAPHY;  Surgeon: Yvonne Kendall, MD;  Location: [facility] INVASIVE CV LAB;  Service: Cardiovascular;  Laterality: N/A;   TONSILLECTOMY  05/26/1949       Family History  Problem Relation Age of Onset   Heart failure Mother    Heart disease Father     Social History   Tobacco Use   Smoking status: Every Day    Packs/day: 0.50    Years: 54.00    Pack years: 27.00    Types: Cigarettes   Smokeless tobacco: Never   Tobacco comments:    Patient given the phone number 1-800-quit-now. Patient has no desire to quit  Substance Use Topics   Alcohol use: No   Drug use: No    Home Medications Prior to Admission medications   Medication Sig Start Date End Date Taking?  Authorizing Provider  amLODipine (NORVASC) 10 MG tablet Take 1 tablet (10 mg total) by mouth daily. 03/08/21   Parke Poisson, MD  aspirin EC 81 MG tablet Take 81 mg by mouth daily. Swallow whole.    [provider]  atorvastatin (LIPITOR) 40 MG tablet Take 1 tablet (40 mg total) by mouth daily. 03/07/21 04/06/21  Sigmund Hazel, MD  clopidogrel (PLAVIX) 75 MG tablet Take 1 tablet (75 mg total) by mouth daily. 03/07/21   Parke Poisson, MD  isosorbide mononitrate (IMDUR) 30 MG 24 hr tablet Take 1 tablet (30 mg total) by mouth daily. 03/11/21   Parke Poisson, MD  nicotine (NICODERM CQ - DOSED IN MG/24 HOURS) 21 mg/24hr patch Place 1 patch (21 mg  total) onto the skin daily. Patient not taking: No sig reported 11/10/20   Joseph Art, DO  nitroGLYCERIN (NITROSTAT) 0.4 MG SL tablet Place 1 tablet (0.4 mg total) under the tongue every 5 (five) minutes as needed for chest pain. YOU MAY TAKE (1) TABLET EVERY 5 MINS AS NEEDED FOR CHEST PAIN. DO NOT EXCEED 3 DOSES. 12/06/20 01/23/24  Parke Poisson, MD    Allergies    Lisinopril, Ace inhibitors, and Aleve [naproxen sodium]  Review of Systems   Review of Systems  Constitutional:  Negative for chills, diaphoresis and fever.  HENT:  Negative for facial swelling, trouble swallowing and voice change.   Respiratory:  Positive for shortness of breath (intermittent @ night for couple of months).   Cardiovascular:  Positive for chest pain (1 episode).  Gastrointestinal:  Negative for nausea and vomiting.  Skin:  Positive for rash.  Neurological:  Negative for syncope.  All other systems reviewed and are negative.  Physical Exam Updated Vital Signs BP (!) 158/82 (BP Location: Left Arm)   Pulse (!) 53   Temp 97.6 F (36.4 C) (Oral)   Resp 18   SpO2 96%   Physical Exam Vitals and nursing note reviewed.  Constitutional:      General: He is not in acute distress.    Appearance: He is well-developed. He is not toxic-appearing.  HENT:     Head: Normocephalic and atraumatic.     Mouth/Throat:     Pharynx: Oropharynx is clear.     Comments: No angioedema. Posterior oropharynx is symmetric appearing. Patient tolerating own secretions without difficulty. No trismus. No drooling. No hot potato voice. No swelling beneath the tongue, submandibular compartment is soft.  Eyes:     General:        Right eye: No discharge.        Left eye: No discharge.     Conjunctiva/sclera: Conjunctivae normal.  Cardiovascular:     Rate and Rhythm: Regular rhythm. Bradycardia present.  Pulmonary:     Effort: Pulmonary effort is normal. No respiratory distress.     Breath sounds: Normal breath sounds. No  wheezing, rhonchi or rales.  Abdominal:     General: There is no distension.     Palpations: Abdomen is soft.     Tenderness: There is no abdominal tenderness. There is no guarding or rebound.  Musculoskeletal:     Cervical back: Neck supple.  Skin:    Findings: Rash present.     Comments: Scattered papules and a few vesicles to upper/lower extremities/trunk, spares the palms/soles & mucous membranes.   Neurological:     Mental Status: He is alert.     Comments: Clear speech.   Psychiatric:  Behavior: Behavior normal.       ED Results / Procedures / Treatments   Labs (all labs ordered are listed, but only abnormal results are displayed) Labs Reviewed  BASIC METABOLIC PANEL - Abnormal; Notable for the following components:      Result Value   Glucose, Bld 115 (*)    All other components within normal limits  CBC  TROPONIN I (HIGH SENSITIVITY)  TROPONIN I (HIGH SENSITIVITY)    EKG EKG Interpretation  Date/Time:  Sunday April 24 2021 07:07:57 EDT Ventricular Rate:  56 PR Interval:  150 QRS Duration: 116 QT Interval:  458 QTC Calculation: 441 R Axis:   -62 Text Interpretation: Sinus bradycardia Incomplete right bundle branch block Left anterior fascicular block Nonspecific T wave abnormality Abnormal ECG Confirmed by Margarita Grizzle [phone]) on 04/24/2021 2:29:18 PM  Radiology DG Chest 2 View  Result Date: 04/24/2021 CLINICAL DATA:  75 year old male with chest pain and shortness of breath. EXAM: CHEST - 2 VIEW COMPARISON:  Chest radiographs 12/29/2020 and earlier. FINDINGS: AP and lateral views of the chest. Chronic tortuosity of the thoracic aorta appears stable. Other mediastinal contours are within normal limits. Visualized tracheal air column is within normal limits. Chronic large lung volumes. Chronic increased pulmonary interstitial markings appear stable since 2019. No pneumothorax, pulmonary edema, pleural effusion or confluent pulmonary opacity. Chronic severe  compression fracture at the lumbosacral junction. No acute osseous abnormality identified. Negative visible bowel gas pattern. IMPRESSION: No acute cardiopulmonary abnormality. Chronic pulmonary hyperinflation suspected. Electronically Signed   By: Odessa Fleming M.D.   On: 04/24/2021 07:45    Procedures Procedures   Medications Ordered in ED Medications - No data to display  ED Course  I have reviewed the triage vital signs and the nursing notes.  Pertinent labs & imaging results that were available during my care of the patient were reviewed by me and considered in my medical decision making (see chart for details).    MDM Rules/Calculators/A&P                           Patient presents to the ED with complaints of rash as well as an episode of chest pain yesterday. Patient is nontoxic, mildly bradycardic & hypertensive.    Additional history obtained:  Additional history obtained from chart review & nursing note review.   EKG: Sinus bradycardia Incomplete right bundle branch block Left anterior fascicular block Nonspecific T wave abnormality- no significant change compared to prior- reviewed w/ attending.   Lab Tests:  I reviewed and interpreted labs, which included:  CBC/BMP: unremarkable.  Troponin: WNL- flat.   Imaging Studies ordered:  CXR ordered by triage- I independently reviewed, formal radiology impression shows:  No acute cardiopulmonary abnormality. Chronic pulmonary hyperinflation suspected.   ED Course:  Chest pain- 1 isolated episode while bending over to place his socks on, EKG without significant change compared to prior, troponins are flat, pain seemed to improve for several minutes after taking the nitroglycerin as opposed to immediate relief with nitro, he also was more so exerting himself day prior without discomfort, overall low suspicion for ACS.  Low risk wells- doubt PE. No widened mediastinum on CXR to suggest dissection. Unclear definitive etiology. Possibly  cramping type pain with position change, no recurrence, PCP/cardiology follow up.   Rash- Rash scattered to extremities/trunk- spares the mucous membranes & the palms/soles. Airway is patent, no stridor, no angioedema, clinically not consistent w/ anaphylaxis. Clinically feel that  SJS, TEN, TSS, tick borne illness, syphilis or other life-threatening condition are unlikely.  Exam not consistent w/ superimposed infection. Unclear definitve etiology- considering Poison ivy vs. Bug bites, will tx with prolonged steroid taper & zyrtec.   I discussed results, treatment plan, need for follow-up, and return precautions with the patient. Provided opportunity for questions, patient confirmed understanding and is in agreement with plan.   This is a shared visit with supervising physician Dr. Rosalia Hammers who has independently evaluated patient & provided guidance in evaluation/management/disposition, in agreement with care   Portions of this note were generated with Dragon dictation software. Dictation errors may occur despite best attempts at proofreading.  Final Clinical Impression(s) / ED Diagnoses Final diagnoses:  Rash    Rx / DC Orders ED Discharge Orders          Ordered    predniSONE (DELTASONE) 10 MG tablet        04/24/21 1446    cetirizine (ZYRTEC ALLERGY) 10 MG tablet  Daily PRN        04/24/21 1446             Anielle Headrick, Pleas Koch, PA-C 04/24/21 1502    Margarita Grizzle, MD 04/25/21 1330

## 2021-04-24 NOTE — Discharge Instructions (Addendum)
You were seen in the Er today for a rash and an episode of chest pain.  Your EKG was similar to prior and your heart enzymes were normal.  Given you did have this episode we recommend call your cardiology office to make them aware of it.  We are sending you home with prednisone and Zyrtec to help with your rash.  Please take the prednisone as a taper as prescribed.  Please take Zyrtec daily as needed for itching.  We have prescribed you new medication(s) today. Discuss the medications prescribed today with your pharmacist as they can have adverse effects and interactions with your other medicines including over the counter and prescribed medications. Seek medical evaluation if you start to experience new or abnormal symptoms after taking one of these medicines, seek care immediately if you start to experience difficulty breathing, feeling of your throat closing, facial swelling, or rash as these could be indications of a more serious allergic reaction  Please be sure to wash all of your clothing/sheets and very hot water.  Please avoid scratching the areas as best possible.  Please avoid touching the rash areas.  Please follow-up with your primary care provider within 3 days for recheck.  Return to the emergency department for new or worsening symptoms including but not limited to return of chest pain, facial swelling, trouble swallowing, trouble breathing, passing out, rash to the palms or soles, abnormal lesions in your mouth, fever, or any other concerns.  We have also provided dermatology information for local offices for follow up.  [facility] Dermatologists:   [facility]  3.2 [phone]  Dermatologist  [address] [place] # [place]  [phone]   Dr. Mertha Finders, MD  2.6 [phone])  Dermatologist  [address] [place]  [phone]  [facility]  3.5 (3)  [facility]  [address] [place]  [phone]   [facility]  4.0 (4)  Dermatologist   [address]  [phone]  Janalyn Harder MD  3.0 (2)  Dermatologist  [address]  [phone]  Hoyle Sauer  2.7 (6)  Dermatologist  [address] [place]  [phone]  [place] Amy Y MD  2.0 (1)  Dermatologist  [address] [place]  [phone]  [facility]  5.0 (3)  Doctor  [address]  [phone]

## 2021-04-24 NOTE — ED Notes (Signed)
Pt in bed, pt c/o rash that started about two days ago, pt had diffuse pustules on person, states that they don't hurt, but itch.

## 2021-04-25 ENCOUNTER — Telehealth (HOSPITAL_COMMUNITY): Payer: Self-pay | Admitting: Family Medicine

## 2021-04-25 ENCOUNTER — Encounter (HOSPITAL_COMMUNITY): Payer: Medicare Other

## 2021-04-26 NOTE — Progress Notes (Signed)
Reviewed home exercise Rx with patient today. Pt encouraged to begin doing some regular walking 2-3 x/week for 30 minutes. Encouraged warm-up, cool-down and stretching. Reviewed THRR and keeping RPE between fairly light and somewhat hard.  Encouraged hydration. Reviewed and stressed weather parameters for temperature and humidity. Either for working in the yard or exercising outdoors. Pt had voiced he had trimmed his bushes in the heat of the day and felt like he was about ready to pass out. Reviewed S/S to terminate exercise and when to call 911 vs MD. Reviewed use of NTG and to carry at all times which patient voices he does.  Encouraged to always carry cell phone if exercising outdoors. Pt verbalized understanding of the home exercise Rx and was provided a copy.     [name] MS, ACSM-CEP, CCRP

## 2021-04-27 ENCOUNTER — Encounter (HOSPITAL_COMMUNITY)
Admission: RE | Admit: 2021-04-27 | Discharge: 2021-04-27 | Disposition: A | Discharge status: Home / Self Care | Payer: Medicare Other | Source: Ambulatory Visit | Attending: Internal Medicine | Admitting: Internal Medicine

## 2021-04-27 ENCOUNTER — Other Ambulatory Visit: Payer: Self-pay

## 2021-04-27 DIAGNOSIS — Z5189 Encounter for other specified aftercare: Secondary | ICD-10-CM | POA: Diagnosis not present

## 2021-04-27 DIAGNOSIS — I252 Old myocardial infarction: Secondary | ICD-10-CM | POA: Insufficient documentation

## 2021-04-27 DIAGNOSIS — Z955 Presence of coronary angioplasty implant and graft: Secondary | ICD-10-CM | POA: Insufficient documentation

## 2021-04-27 DIAGNOSIS — I214 Non-ST elevation (NSTEMI) myocardial infarction: Secondary | ICD-10-CM

## 2021-04-29 ENCOUNTER — Other Ambulatory Visit: Payer: Self-pay

## 2021-04-29 ENCOUNTER — Encounter (HOSPITAL_COMMUNITY)
Admission: RE | Admit: 2021-04-29 | Discharge: 2021-04-29 | Disposition: A | Discharge status: Home / Self Care | Payer: Medicare Other | Source: Ambulatory Visit | Attending: Internal Medicine | Admitting: Internal Medicine

## 2021-04-29 DIAGNOSIS — Z955 Presence of coronary angioplasty implant and graft: Secondary | ICD-10-CM | POA: Diagnosis not present

## 2021-04-29 DIAGNOSIS — Z5189 Encounter for other specified aftercare: Secondary | ICD-10-CM | POA: Diagnosis not present

## 2021-04-29 DIAGNOSIS — I214 Non-ST elevation (NSTEMI) myocardial infarction: Secondary | ICD-10-CM

## 2021-04-29 DIAGNOSIS — I252 Old myocardial infarction: Secondary | ICD-10-CM | POA: Diagnosis not present

## 2021-05-02 ENCOUNTER — Other Ambulatory Visit: Payer: Self-pay

## 2021-05-02 ENCOUNTER — Encounter (HOSPITAL_COMMUNITY)
Admission: RE | Admit: 2021-05-02 | Discharge: 2021-05-02 | Disposition: A | Discharge status: Home / Self Care | Payer: Medicare Other | Source: Ambulatory Visit | Attending: Internal Medicine | Admitting: Internal Medicine

## 2021-05-02 DIAGNOSIS — Z955 Presence of coronary angioplasty implant and graft: Secondary | ICD-10-CM | POA: Diagnosis not present

## 2021-05-02 DIAGNOSIS — I214 Non-ST elevation (NSTEMI) myocardial infarction: Secondary | ICD-10-CM

## 2021-05-02 DIAGNOSIS — I252 Old myocardial infarction: Secondary | ICD-10-CM | POA: Diagnosis not present

## 2021-05-02 DIAGNOSIS — Z5189 Encounter for other specified aftercare: Secondary | ICD-10-CM | POA: Diagnosis not present

## 2021-05-02 NOTE — Progress Notes (Signed)
Nutrition Note - Follow Up  Spoke with pt. Pt has made appropriate changes with breakfast and lunch. He has eliminated processed meats at breakfast. He is choosing eggs, wheat toast, and grits without butter. He is choosing a salad or veggies for lunch.  We worked on heart healthy dinner ideas today. He normally eats canned chicken noodle soup and crackers. We reviewed ways to reduce sodium intake. Provided written instructions for pt to share with his wife.   Nutrition Diagnosis   Food-and nutrition-related knowledge deficit related to lack of exposure to information as related to diagnosis of: ? CVD ? Pre-diabetes Nutrition Intervention   Pt's individual nutrition plan reviewed with pt. Benefits of adopting Heart Healthy diet discussed when Picture Your Plate reviewed.       Continue client-centered nutrition education by RD, as part of interdisciplinary care.   Goal(s)   Pt to identify food quantities necessary to achieve weight loss of 6-24 lb at graduation from cardiac rehab. Pt to build a healthy plate including vegetables, fruits, whole grains, and low-fat dairy products in a heart healthy meal plan. Pt to reduce processed meats to 2 times per week    Plan:  Will provide client-centered nutrition education as part of interdisciplinary care Monitor and evaluate progress toward nutrition goal with team.  Andrey Campanile, MS, RDN, LDN, CDCES

## 2021-05-04 ENCOUNTER — Other Ambulatory Visit: Payer: Self-pay

## 2021-05-04 ENCOUNTER — Encounter (HOSPITAL_COMMUNITY)
Admission: RE | Admit: 2021-05-04 | Discharge: 2021-05-04 | Disposition: A | Discharge status: Home / Self Care | Payer: Medicare Other | Source: Ambulatory Visit | Attending: Internal Medicine | Admitting: Internal Medicine

## 2021-05-04 DIAGNOSIS — Z955 Presence of coronary angioplasty implant and graft: Secondary | ICD-10-CM | POA: Diagnosis not present

## 2021-05-04 DIAGNOSIS — I252 Old myocardial infarction: Secondary | ICD-10-CM | POA: Diagnosis not present

## 2021-05-04 DIAGNOSIS — Z5189 Encounter for other specified aftercare: Secondary | ICD-10-CM | POA: Diagnosis not present

## 2021-05-04 DIAGNOSIS — I214 Non-ST elevation (NSTEMI) myocardial infarction: Secondary | ICD-10-CM

## 2021-05-06 ENCOUNTER — Other Ambulatory Visit: Payer: Self-pay | Admitting: Internal Medicine

## 2021-05-06 ENCOUNTER — Telehealth (HOSPITAL_COMMUNITY): Payer: Self-pay | Admitting: Family Medicine

## 2021-05-06 ENCOUNTER — Encounter (HOSPITAL_COMMUNITY): Payer: Medicare Other

## 2021-05-09 ENCOUNTER — Other Ambulatory Visit: Payer: Self-pay

## 2021-05-09 ENCOUNTER — Encounter (HOSPITAL_COMMUNITY)
Admission: RE | Admit: 2021-05-09 | Discharge: 2021-05-09 | Disposition: A | Discharge status: Home / Self Care | Payer: Medicare Other | Source: Ambulatory Visit | Attending: Internal Medicine | Admitting: Internal Medicine

## 2021-05-09 DIAGNOSIS — Z955 Presence of coronary angioplasty implant and graft: Secondary | ICD-10-CM

## 2021-05-09 DIAGNOSIS — I252 Old myocardial infarction: Secondary | ICD-10-CM | POA: Diagnosis not present

## 2021-05-09 DIAGNOSIS — Z5189 Encounter for other specified aftercare: Secondary | ICD-10-CM | POA: Diagnosis not present

## 2021-05-09 DIAGNOSIS — I214 Non-ST elevation (NSTEMI) myocardial infarction: Secondary | ICD-10-CM

## 2021-05-10 NOTE — Progress Notes (Signed)
Cardiac Individual Treatment Plan  Patient Details  Name: Jacob Ruiz MRN: [id number] Date of Birth: 08-13-1946 Referring Provider:   Flowsheet Row CARDIAC REHAB PHASE II ORIENTATION from 03/29/2021 in [facility] CARDIAC REHAB  Referring Provider Weston Brass, MD       Initial Encounter Date:  Flowsheet Row CARDIAC REHAB PHASE II ORIENTATION from 03/29/2021 in [facility] CARDIAC REHAB  Date 03/29/21       Visit Diagnosis: 12/29/20 NSTEMI   12/31/20 S/P DES LCX x 2   Patient's Home Medications on Admission:  Current Outpatient Medications:    amLODipine (NORVASC) 10 MG tablet, Take 1 tablet (10 mg total) by mouth daily., Disp: 90 tablet, Rfl: 3   aspirin EC 81 MG tablet, Take 81 mg by mouth daily. Swallow whole., Disp: , Rfl:    atorvastatin (LIPITOR) 40 MG tablet, Take 1 tablet (40 mg total) by mouth daily., Disp: 90 tablet, Rfl: 3   cetirizine (ZYRTEC ALLERGY) 10 MG tablet, Take 1 tablet (10 mg total) by mouth daily as needed for allergies (itching)., Disp: 10 tablet, Rfl: 0   clopidogrel (PLAVIX) 75 MG tablet, Take 1 tablet (75 mg total) by mouth daily., Disp: 60 tablet, Rfl: 0   isosorbide mononitrate (IMDUR) 30 MG 24 hr tablet, Take 1 tablet (30 mg total) by mouth daily., Disp: 90 tablet, Rfl: 2   nicotine (NICODERM CQ - DOSED IN MG/24 HOURS) 21 mg/24hr patch, Place 1 patch (21 mg total) onto the skin daily. (Patient not taking: No sig reported), Disp: 28 patch, Rfl: 0   nitroGLYCERIN (NITROSTAT) 0.4 MG SL tablet, Place 1 tablet (0.4 mg total) under the tongue every 5 (five) minutes as needed for chest pain. YOU MAY TAKE (1) TABLET EVERY 5 MINS AS NEEDED FOR CHEST PAIN. DO NOT EXCEED 3 DOSES., Disp: 90 tablet, Rfl: 3   predniSONE (DELTASONE) 10 MG tablet, Take 40 mg (4 tablets) by mouth for day 1-5 then 20 mg (2 tablets) by mouth for day 6-10., Disp: 30 tablet, Rfl: 0  Past Medical History: Past Medical History:  Diagnosis Date   Chronic  bronchitis (HCC)    "get it just about q yr" (03/26/2014)   Coronary artery disease    H/O hiatal hernia    Hyperlipidemia     Tobacco Use: Social History   Tobacco Use  Smoking Status Every Day   Packs/day: 0.50   Years: 54.00   Pack years: 27.00   Types: Cigarettes  Smokeless Tobacco Never  Tobacco Comments   Patient given the phone number 1-800-quit-now. Patient has no desire to quit    Labs: Recent Review Flowsheet Data     Labs for ITP Cardiac and Pulmonary Rehab Latest Ref Rng & Units 02/01/2009 03/26/2014 11/07/2020 12/29/2020 12/30/2020   Cholestrol 0 - 200 mg/dL 409 - 811 - 914   LDLCALC 0 - 99 mg/dL 782(N) - 562(Z) - 73   LDLDIRECT mg/dL - - - - -   HDL >30 mg/dL 86.57 - 64 - 59   Trlycerides <150 mg/dL 84.6 - 76 - 39   Hemoglobin A1c 4.8 - 5.6 % - - - - 6.1(H)   TCO2 22 - 32 mmol/L - 28 - 26 -       Capillary Blood Glucose: Lab Results  Component Value Date   GLUCAP 145 (H) 12/29/2020     Exercise Target Goals: Exercise Program Goal: Individual exercise prescription set using results from initial 6 min walk test and THRR while  considering  patient's activity barriers and safety.   Exercise Prescription Goal: Starting with aerobic activity 30 plus minutes a day, 3 days per week for initial exercise prescription. Provide home exercise prescription and guidelines that participant acknowledges understanding prior to discharge.  Activity Barriers & Risk Stratification:  Activity Barriers & Cardiac Risk Stratification - 03/29/21 1625       Activity Barriers & Cardiac Risk Stratification   Activity Barriers Back Problems;Neck/Spine Problems;Joint Problems;Shortness of Breath;Chest Pain/Angina;Balance Concerns;History of Falls    Cardiac Risk Stratification High             6 Minute Walk:  6 Minute Walk     Row Name 03/29/21 1542         6 Minute Walk   Phase Initial     Distance 1441 feet     Walk Time 6 minutes     # of Rest Breaks 0     MPH 2.73      METS 2.71     RPE 11     Perceived Dyspnea  1     VO2 Peak 9047     Symptoms Yes (comment)     Comments SOB, RPD = 1.  Denies pain     Resting HR 61 bpm     Resting BP 108/72     Resting Oxygen Saturation  94 %     Exercise Oxygen Saturation  during 6 min walk 95 %     Max Ex. HR 71 bpm     Max Ex. BP 140/70     2 Minute Post BP 114/62              Oxygen Initial Assessment:   Oxygen Re-Evaluation:   Oxygen Discharge (Final Oxygen Re-Evaluation):   Initial Exercise Prescription:  Initial Exercise Prescription - 03/29/21 1600       Date of Initial Exercise RX and Referring Provider   Date 03/29/21    Referring Provider Weston Brass, MD    Expected Discharge Date 05/27/21      NuStep   Level 2    SPM 75    Minutes 15    METs 2      Arm Ergometer   Level 2    Minutes 15    METs 2      Prescription Details   Frequency (times per week) 3    Duration Progress to 30 minutes of continuous aerobic without signs/symptoms of physical distress      Intensity   THRR 40-80% of Max Heartrate 58-117    Ratings of Perceived Exertion 11-13    Perceived Dyspnea 0-4      Progression   Progression Continue progressive overload as per policy without signs/symptoms or physical distress.      Resistance Training   Training Prescription Yes    Weight 4 lbs    Reps 10-15             Perform Capillary Blood Glucose checks as needed.  Exercise Prescription Changes:   Exercise Prescription Changes     Row Name 04/04/21 1000 04/15/21 1000 04/22/21 1034 05/09/21 1200       Response to Exercise   Blood Pressure (Admit) 144/70 130/70 150/72 130/60    Blood Pressure (Exercise) 154/72 142/68 138/70 138/80    Blood Pressure (Exit) 114/66 128/72 124/70 128/62    Heart Rate (Admit) 66 bpm 69 bpm 59 bpm 69 bpm    Heart Rate (Exercise) 88 bpm 77 bpm 67 bpm  76 bpm    Heart Rate (Exit) 65 bpm 68 bpm 61 bpm 63 bpm    Rating of Perceived Exertion (Exercise) 11 11  12 13     Symptoms None None None Sore ribs (left)    Comments Pt's first day in the CRP2 program Reviewed METs/Increased workload on nustep Reviewed home exercise and METs Reviewed METs and Goals    Duration Progress to 30 minutes of  aerobic without signs/symptoms of physical distress Continue with 30 min of aerobic exercise without signs/symptoms of physical distress. Continue with 30 min of aerobic exercise without signs/symptoms of physical distress. Continue with 30 min of aerobic exercise without signs/symptoms of physical distress.    Intensity THRR unchanged THRR unchanged THRR unchanged THRR unchanged         Progression   Progression Continue to progress workloads to maintain intensity without signs/symptoms of physical distress. Continue to progress workloads to maintain intensity without signs/symptoms of physical distress. Continue to progress workloads to maintain intensity without signs/symptoms of physical distress. Continue to progress workloads to maintain intensity without signs/symptoms of physical distress.    Average METs 2 2.7 2.5 2         Resistance Training   Training Prescription Yes Yes Yes Yes    Weight 4 lbs 4 lbs 4 lbs 4 lbs    Reps 10-15 10-15 10-15 10-15    Time 10 Minutes 10 Minutes 10 Minutes 10 Minutes         NuStep   Level 2 3 3 3     SPM 75 85 75 75    Minutes 15 15 15 15     METs 2 3 2.5 2         Arm Ergometer   Level 3 3 3 4     Watts 8 13 7 7     Minutes 15 15 15 15          Home Exercise Plan   Plans to continue exercise at -- -- Home (comment) Home (comment)    Frequency -- -- Add 2 additional days to program exercise sessions. Add 2 additional days to program exercise sessions.    Initial Home Exercises Provided -- -- 04/22/21 04/22/21             Exercise Comments:   Exercise Comments     Row Name 04/04/21 1022 04/15/21 1010 04/22/21 1231 05/09/21 1211     Exercise Comments Pt's first day in the CRP2 program. Pt tolerated  session well and is off to a good start. Reviewed METs with patient today. Increased workload on Nustep with no complaints. Pt voices working in the yard during the heat of the day on Wednesday trimming around bushes with the weed-eater. Pt caution about being out working in the heat/humidity. Reviewed weather paramters for activites outdoors. Pt voices understanding. Reviewed METs and home exercise Rx with patient today. Pt verbalized understanding of home exercise Rx and was provided a copy. Reviwed METs and goals. Pt making slow progress.             Exercise Goals and Review:   Exercise Goals     Row Name 03/29/21 1625             Exercise Goals   Increase Physical Activity Yes       Intervention Provide advice, education, support and counseling about physical activity/exercise needs.;Develop an individualized exercise prescription for aerobic and resistive training based on initial evaluation findings, risk stratification, comorbidities and participant's personal goals.  Expected Outcomes Short Term: Attend rehab on a regular basis to increase amount of physical activity.;Long Term: Add in home exercise to make exercise part of routine and to increase amount of physical activity.;Long Term: Exercising regularly at least 3-5 days a week.       Increase Strength and Stamina Yes       Intervention Provide advice, education, support and counseling about physical activity/exercise needs.;Develop an individualized exercise prescription for aerobic and resistive training based on initial evaluation findings, risk stratification, comorbidities and participant's personal goals.       Expected Outcomes Short Term: Increase workloads from initial exercise prescription for resistance, speed, and METs.;Short Term: Perform resistance training exercises routinely during rehab and add in resistance training at home;Long Term: Improve cardiorespiratory fitness, muscular endurance and strength as  measured by increased METs and functional capacity ( )       Able to understand and use rate of perceived exertion (RPE) scale Yes       Intervention Provide education and explanation on how to use RPE scale       Expected Outcomes Short Term: Able to use RPE daily in rehab to express subjective intensity level;Long Term:  Able to use RPE to guide intensity level when exercising independently       Knowledge and understanding of Target Heart Rate Range (THRR) Yes       Intervention Provide education and explanation of THRR including how the numbers were predicted and where they are located for reference       Expected Outcomes Short Term: Able to state/look up THRR;Short Term: Able to use daily as guideline for intensity in rehab;Long Term: Able to use THRR to govern intensity when exercising independently       Understanding of Exercise Prescription Yes       Intervention Provide education, explanation, and written materials on patient's individual exercise prescription       Expected Outcomes Short Term: Able to explain program exercise prescription;Long Term: Able to explain home exercise prescription to exercise independently                Exercise Goals Re-Evaluation :  Exercise Goals Re-Evaluation     Row Name 04/04/21 1021 04/22/21 1227 05/09/21 1208         Exercise Goal Re-Evaluation   Exercise Goals Review Increase Physical Activity;Increase Strength and Stamina;Able to understand and use rate of perceived exertion (RPE) scale;Knowledge and understanding of Target Heart Rate Range (THRR);Understanding of Exercise Prescription Increase Physical Activity;Increase Strength and Stamina;Able to understand and use rate of perceived exertion (RPE) scale;Able to understand and use Dyspnea scale;Able to check pulse independently;Understanding of Exercise Prescription Increase Physical Activity;Increase Strength and Stamina;Able to understand and use rate of perceived exertion (RPE)  scale;Knowledge and understanding of Target Heart Rate Range (THRR);Able to check pulse independently;Understanding of Exercise Prescription     Comments Pt's first day in the CRP2 program. Pt understands the exercise Rx, THRR, and RPE scale. Reviewed home exercise Rx and METs. Pt encouraged to begin walking 2-3x/week for 30 minutes. Pt voices already walking in the morning around the yard. Reviewed METs and goals. Pt working at lower spm today due to sore ribs from installing a dog pen. Pt walking at home and walking with the dog now too.     Expected Outcomes Will continue to monitor patient and increase exercise workloads as tolerated. Pt will begin to walk ona more regular basis at home. Will continue to [employer] and progess workloads  as tolerated.               Discharge Exercise Prescription (Final Exercise Prescription Changes):  Exercise Prescription Changes - 05/09/21 1200       Response to Exercise   Blood Pressure (Admit) 130/60    Blood Pressure (Exercise) 138/80    Blood Pressure (Exit) 128/62    Heart Rate (Admit) 69 bpm    Heart Rate (Exercise) 76 bpm    Heart Rate (Exit) 63 bpm    Rating of Perceived Exertion (Exercise) 13    Symptoms Sore ribs (left)    Comments Reviewed METs and Goals    Duration Continue with 30 min of aerobic exercise without signs/symptoms of physical distress.    Intensity THRR unchanged      Progression   Progression Continue to progress workloads to maintain intensity without signs/symptoms of physical distress.    Average METs 2      Resistance Training   Training Prescription Yes    Weight 4 lbs    Reps 10-15    Time 10 Minutes      NuStep   Level 3    SPM 75    Minutes 15    METs 2      Arm Ergometer   Level 4    Watts 7    Minutes 15      Home Exercise Plan   Plans to continue exercise at Home (comment)    Frequency Add 2 additional days to program exercise sessions.    Initial Home Exercises Provided 04/22/21              Nutrition:  Target Goals: Understanding of nutrition guidelines, daily intake of sodium 1500mg , cholesterol 200mg , calories 30% from fat and 7% or less from saturated fats, daily to have 5 or more servings of fruits and vegetables.  Biometrics:  Pre Biometrics - 03/29/21 1430       Pre Biometrics   Waist Circumference 42.25 inches    Hip Circumference 42.25 inches    Waist to Hip Ratio 1 %    Triceps Skinfold 19 mm    % Body Fat 29.7 %    Grip Strength 46 kg    Flexibility 14.5 in    Single Leg Stand 3.24 seconds              Nutrition Therapy Plan and Nutrition Goals:  Nutrition Therapy & Goals - 04/06/21 1044       Nutrition Therapy   Diet TLC    Drug/Food Interactions Statins/Certain Fruits      Personal Nutrition Goals   Nutrition Goal Pt to identify food quantities necessary to achieve weight loss of 6-24 lb at graduation from cardiac rehab.    Personal Goal #2 Pt to build a healthy plate including vegetables, fruits, whole grains, and low-fat dairy products in a heart healthy meal plan.    Personal Goal #3 Pt to reduce processed meats to 2 times per week      Intervention Plan   Intervention Prescribe, educate and counsel regarding individualized specific dietary modifications aiming towards targeted core components such as weight, hypertension, lipid management, diabetes, heart failure and other comorbidities.;Nutrition handout(s) given to patient.    Expected Outcomes Short Term Goal: A plan has been developed with personal nutrition goals set during dietitian appointment.;Long Term Goal: Adherence to prescribed nutrition plan.             Nutrition Assessments:  MEDIFICTS Score Key: ?70 Need to  make dietary changes  40-70 Heart Healthy Diet ? 40 Therapeutic Level Cholesterol Diet  Flowsheet Row CARDIAC REHAB PHASE II EXERCISE from 04/06/2021 in [facility] CARDIAC REHAB  Picture Your Plate Total Score on Admission 52       Picture Your Plate Scores: <92 Unhealthy dietary pattern with much room for improvement. 41-50 Dietary pattern unlikely to meet recommendations for good health and room for improvement. 51-60 More healthful dietary pattern, with some room for improvement.  >60 Healthy dietary pattern, although there may be some specific behaviors that could be improved.    Nutrition Goals Re-Evaluation:  Nutrition Goals Re-Evaluation     Row Name 04/06/21 1044 05/05/21 0859           Goals   Current Weight 195 lb (88.5 kg) 195 lb 5.2 oz (88.6 kg)      Nutrition Goal -- Pt to identify food quantities necessary to achieve weight loss of 6-24 lb at graduation from cardiac rehab.      Comment -- No weight loss. Pt has made diet changes to breakfast and lunch. He reports avoiding processed meats.             Personal Goal #2 Re-Evaluation   Personal Goal #2 -- Pt to build a healthy plate including vegetables, fruits, whole grains, and low-fat dairy products in a heart healthy meal plan.             Personal Goal #3 Re-Evaluation   Personal Goal #3 -- Pt to reduce processed meats to 2 times per week               Nutrition Goals Discharge (Final Nutrition Goals Re-Evaluation):  Nutrition Goals Re-Evaluation - 05/05/21 0859       Goals   Current Weight 195 lb 5.2 oz (88.6 kg)    Nutrition Goal Pt to identify food quantities necessary to achieve weight loss of 6-24 lb at graduation from cardiac rehab.    Comment No weight loss. Pt has made diet changes to breakfast and lunch. He reports avoiding processed meats.      Personal Goal #2 Re-Evaluation   Personal Goal #2 Pt to build a healthy plate including vegetables, fruits, whole grains, and low-fat dairy products in a heart healthy meal plan.      Personal Goal #3 Re-Evaluation   Personal Goal #3 Pt to reduce processed meats to 2 times per week             Psychosocial: Target Goals: Acknowledge presence or absence of significant  depression and/or stress, maximize coping skills, provide positive support system. Participant is able to verbalize types and ability to use techniques and skills needed for reducing stress and depression.  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 Yes   JR has his wife son and friend for support     Barriers   Psychosocial barriers to participate in program The patient should benefit from training in stress management and relaxation.  Screening Interventions   Interventions Encouraged to exercise;To provide support and resources with identified psychosocial needs;Provide feedback about the scores to participant    Expected Outcomes Long Term Goal: Stressors or current issues are controlled or eliminated.;Short Term goal: Identification and review with participant of any Quality of Life or Depression concerns found by scoring the questionnaire.;Long Term goal: The participant improves quality of Life and PHQ9 Scores as seen by post scores and/or verbalization of changes             Quality of Life Scores:  Quality of Life - 03/29/21 1620       Quality of Life   Select Quality of Life      Quality of Life Scores   Health/Function Pre 8.5 %    Socioeconomic Pre 11.94 %    Psych/Spiritual Pre 12.86 %    Family Pre 23.3 %    GLOBAL Pre 12.27 %            Scores of 19 and below usually indicate a poorer quality of life in these areas.  A difference of  2-3 points is a clinically meaningful difference.  A difference of 2-3 points in the total  score of the Quality of Life Index has been associated with significant improvement in overall quality of life, self-image, physical symptoms, and general health in studies assessing change in quality of life.  PHQ-9: Recent Review Flowsheet Data     Depression screen [facility] 2/9 03/29/2021   Decreased Interest 0   Down, Depressed, Hopeless 0   PHQ - 2 Score 0      Interpretation of Total Score  Total Score Depression Severity:  1-4 = Minimal depression, 5-9 = Mild depression, 10-14 = Moderate depression, 15-19 = Moderately severe depression, 20-27 = Severe depression   Psychosocial Evaluation and Intervention:   Psychosocial Re-Evaluation:  Psychosocial Re-Evaluation     Row Name 04/13/21 1832 05/10/21 1205           Psychosocial Re-Evaluation   Current issues with History of Depression;Current Stress Concerns History of Depression;Current Stress Concerns      Comments 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 Quality of life reviewed with patient and wife on 03/29/21 JR has not voiced any increased stressors or concerns during exercise at phase 2 cardiac rehab.      Expected Outcomes JR will have decreased stress upon completion of phase 2 cardiac rehb JR will have decreased stress upon completion of phase 2 cardiac rehb      Interventions Stress management education;Encouraged to attend Cardiac Rehabilitation for the exercise Stress management education;Encouraged to attend Cardiac Rehabilitation for the exercise      Continue Psychosocial Services  Follow up required by staff Follow up required by staff             Initial Review   Source of Stress Concerns Financial;Poor Coping Skills;Unable to perform yard/household activities;Unable to participate in former interests or hobbies Financial;Poor Pharmacologist;Unable to perform yard/household activities;Unable to participate in former interests or hobbies      Comments Will continue to monitor and offer support as  needed Will continue to monitor and offer support as needed               Psychosocial Discharge (Final Psychosocial Re-Evaluation):  Psychosocial Re-Evaluation - 05/10/21 1205       Psychosocial Re-Evaluation   Current issues with History of Depression;Current Stress Concerns    Comments JR has not  voiced any increased stressors or concerns during exercise at phase 2 cardiac rehab.    Expected Outcomes JR will have decreased stress upon completion of phase 2 cardiac rehb    Interventions Stress management education;Encouraged to attend Cardiac Rehabilitation for the exercise    Continue Psychosocial Services  Follow up required by staff      Initial Review   Source of Stress Concerns Financial;Poor Coping Skills;Unable to perform yard/household activities;Unable to participate in former interests or hobbies    Comments Will continue to monitor and offer support as needed             Vocational Rehabilitation: Provide vocational rehab assistance to qualifying candidates.   Vocational Rehab Evaluation & Intervention:  Vocational Rehab - 03/29/21 1710       Initial Vocational Rehab Evaluation & Intervention   Assessment shows need for Vocational Rehabilitation No             Education: Education Goals: Education classes will be provided on a weekly basis, covering required topics. Participant will state understanding/return demonstration of topics presented.  Learning Barriers/Preferences:  Learning Barriers/Preferences - 03/29/21 1622       Learning Barriers/Preferences   Learning Barriers Reading;Sight    Learning Preferences Audio;Written Material;Computer/Internet;Group Instruction;Individual Instruction;Pictoral;Skilled Demonstration;Verbal Instruction             Education Topics: Hypertension, Hypertension Reduction -Define heart disease and high blood pressure. Discus how high blood pressure affects the body and ways to reduce high blood  pressure.   Exercise and Your Heart -Discuss why it is important to exercise, the FITT principles of exercise, normal and abnormal responses to exercise, and how to exercise safely.   Angina -Discuss definition of angina, causes of angina, treatment of angina, and how to decrease risk of having angina.   Cardiac Medications -Review what the following cardiac medications are used for, how they affect the body, and side effects that may occur when taking the medications.  Medications include Aspirin, Beta blockers, calcium channel blockers, ACE Inhibitors, angiotensin receptor blockers, diuretics, digoxin, and antihyperlipidemics.   Congestive Heart Failure -Discuss the definition of CHF, how to live with CHF, the signs and symptoms of CHF, and how keep track of weight and sodium intake.   Heart Disease and Intimacy -Discus the effect sexual activity has on the heart, how changes occur during intimacy as we age, and safety during sexual activity.   Smoking Cessation / COPD -Discuss different methods to quit smoking, the health benefits of quitting smoking, and the definition of COPD.   Nutrition I: Fats -Discuss the types of cholesterol, what cholesterol does to the heart, and how cholesterol levels can be controlled.   Nutrition II: Labels -Discuss the different components of food labels and how to read food label   Heart Parts/Heart Disease and PAD -Discuss the anatomy of the heart, the pathway of blood circulation through the heart, and these are affected by heart disease.   Stress I: Signs and Symptoms -Discuss the causes of stress, how stress may lead to anxiety and depression, and ways to limit stress.   Stress II: Relaxation -Discuss different types of relaxation techniques to limit stress.   Warning Signs of Stroke / TIA -Discuss definition of a stroke, what the signs and symptoms are of a stroke, and how to identify when someone is having stroke.   Knowledge  Questionnaire Score:  Knowledge Questionnaire Score - 03/29/21 1620       Knowledge Questionnaire Score   Pre  Score 22/28             Core Components/Risk Factors/Patient Goals at Admission:  Personal Goals and Risk Factors at Admission - 03/29/21 1621       Core Components/Risk Factors/Patient Goals on Admission    Weight Management Yes;Weight Loss    Intervention Weight Management: Develop a combined nutrition and exercise program designed to reach desired caloric intake, while maintaining appropriate intake of nutrient and fiber, sodium and fats, and appropriate energy expenditure required for the weight goal.;Weight Management: Provide education and appropriate resources to help participant work on and attain dietary goals.;Weight Management/Obesity: Establish reasonable short term and long term weight goals.    Admit Weight 195 lb 8.8 oz (88.7 kg)    Expected Outcomes Short Term: Continue to assess and modify interventions until short term weight is achieved;Long Term: Adherence to nutrition and physical activity/exercise program aimed toward attainment of established weight goal;Weight Maintenance: Understanding of the daily nutrition guidelines, which includes 25-35% calories from fat, 7% or less cal from saturated fats, less than 200mg  cholesterol, less than 1.5gm of sodium, & 5 or more servings of fruits and vegetables daily;Weight Loss: Understanding of general recommendations for a balanced deficit meal plan, which promotes 1-2 lb weight loss per week and includes a negative energy balance of [phone] kcal/d;Understanding recommendations for meals to include 15-35% energy as protein, 25-35% energy from fat, 35-60% energy from carbohydrates, less than 200mg  of dietary cholesterol, 20-35 gm of total fiber daily;Understanding of distribution of calorie intake throughout the day with the consumption of 4-5 meals/snacks    Tobacco Cessation Yes    Number of packs per day .50     Intervention Assist the participant in steps to quit. Provide individualized education and counseling about committing to Tobacco Cessation, relapse prevention, and pharmacological support that can be provided by physician.;Education officer, environmentalffer self-teaching materials, assist with locating and accessing local/national Quit Smoking programs, and support quit date choice.    Expected Outcomes Short Term: Will demonstrate readiness to quit, by selecting a quit date.;Short Term: Will quit all tobacco product use, adhering to prevention of relapse plan.;Long Term: Complete abstinence from all tobacco products for at least 12 months from quit date.    Hypertension Yes    Intervention Provide education on lifestyle modifcations including regular physical activity/exercise, weight management, moderate sodium restriction and increased consumption of fresh fruit, vegetables, and low fat dairy, alcohol moderation, and smoking cessation.;Monitor prescription use compliance.    Expected Outcomes Short Term: Continued assessment and intervention until BP is < 140/5390mm HG in hypertensive participants. < 130/6080mm HG in hypertensive participants with diabetes, heart failure or chronic kidney disease.;Long Term: Maintenance of blood pressure at goal levels.    Lipids Yes    Intervention Provide education and support for participant on nutrition & aerobic/resistive exercise along with prescribed medications to achieve LDL 70mg , HDL >40mg .    Expected Outcomes Short Term: Participant states understanding of desired cholesterol values and is compliant with medications prescribed. Participant is following exercise prescription and nutrition guidelines.;Long Term: Cholesterol controlled with medications as prescribed, with individualized exercise RX and with personalized nutrition plan. Value goals: LDL < 70mg , HDL > 40 mg.    Stress Yes    Intervention Offer individual and/or small group education and counseling on adjustment to heart  disease, stress management and health-related lifestyle change. Teach and support self-help strategies.;Refer participants experiencing significant psychosocial distress to appropriate mental health specialists for further evaluation and treatment. When possible, include family members and  significant others in education/counseling sessions.    Expected Outcomes Short Term: Participant demonstrates changes in health-related behavior, relaxation and other stress management skills, ability to obtain effective social support, and compliance with psychotropic medications if prescribed.             Core Components/Risk Factors/Patient Goals Review:   Goals and Risk Factor Review     Row Name 04/13/21 1836 05/10/21 1206           Core Components/Risk Factors/Patient Goals Review   Personal Goals Review Weight Management/Obesity;Stress;Lipids;Hypertension;Tobacco Cessation Weight Management/Obesity;Stress;Lipids;Hypertension;Tobacco Cessation      Review JR has been doing well with exercise at cardiac rehab. Vital sings have been stable will continue to encourage smoking cessation JR has been doing well with exercise at cardiac rehab. Vital sings have been stable will continue to encourage smoking cessatio. JR has no plans on quititng smoking at this time      Expected Outcomes JR will continue to partcipate in phase 2 cardiac rehab for exercise, nutrtion and lifestyle modifications JR will continue to partcipate in phase 2 cardiac rehab for exercise, nutrtion and lifestyle modifications               Core Components/Risk Factors/Patient Goals at Discharge (Final Review):   Goals and Risk Factor Review - 05/10/21 1206       Core Components/Risk Factors/Patient Goals Review   Personal Goals Review Weight Management/Obesity;Stress;Lipids;Hypertension;Tobacco Cessation    Review JR has been doing well with exercise at cardiac rehab. Vital sings have been stable will continue to encourage  smoking cessatio. JR has no plans on quititng smoking at this time    Expected Outcomes JR will continue to partcipate in phase 2 cardiac rehab for exercise, nutrtion and lifestyle modifications             ITP Comments:  ITP Comments     Row Name 03/29/21 1707 04/13/21 1831 05/10/21 1204       ITP Comments Dr Armanda Magic MD, Medical Director 30 Day ITP Review. JR has good attendance and partcipation in phase 2 cardiac rehab. Will continue to monitor for s/s/of Angina 30 Day ITP Review. JR has good  partcipation and fair attendance  in phase 2 cardiac rehab.              Comments: See ITP comments.Gladstone Lighter, RN,BSN 05/10/2021 12:10 PM

## 2021-05-11 ENCOUNTER — Other Ambulatory Visit: Payer: Self-pay

## 2021-05-11 ENCOUNTER — Encounter (HOSPITAL_COMMUNITY)
Admission: RE | Admit: 2021-05-11 | Discharge: 2021-05-11 | Disposition: A | Discharge status: Home / Self Care | Payer: Medicare Other | Source: Ambulatory Visit | Attending: Internal Medicine | Admitting: Internal Medicine

## 2021-05-11 DIAGNOSIS — Z955 Presence of coronary angioplasty implant and graft: Secondary | ICD-10-CM | POA: Diagnosis not present

## 2021-05-11 DIAGNOSIS — I252 Old myocardial infarction: Secondary | ICD-10-CM | POA: Diagnosis not present

## 2021-05-11 DIAGNOSIS — Z5189 Encounter for other specified aftercare: Secondary | ICD-10-CM | POA: Diagnosis not present

## 2021-05-11 DIAGNOSIS — I214 Non-ST elevation (NSTEMI) myocardial infarction: Secondary | ICD-10-CM

## 2021-05-12 ENCOUNTER — Other Ambulatory Visit: Payer: Self-pay

## 2021-05-12 MED ORDER — CLOPIDOGREL BISULFATE 75 MG PO TABS: 75.0000 mg | ORAL_TABLET | Freq: Every day | ORAL | 1 refills | Status: AC

## 2021-05-13 ENCOUNTER — Encounter (HOSPITAL_COMMUNITY)
Admission: RE | Admit: 2021-05-13 | Discharge: 2021-05-13 | Disposition: A | Discharge status: Home / Self Care | Payer: Medicare Other | Source: Ambulatory Visit | Attending: Internal Medicine | Admitting: Internal Medicine

## 2021-05-13 DIAGNOSIS — Z955 Presence of coronary angioplasty implant and graft: Secondary | ICD-10-CM

## 2021-05-13 DIAGNOSIS — I214 Non-ST elevation (NSTEMI) myocardial infarction: Secondary | ICD-10-CM

## 2021-05-13 NOTE — Progress Notes (Signed)
Incomplete Session Note  Patient Details  Name: Jacob Ruiz MRN: [id number] Date of Birth: Dec 11, 1945 Referring Provider:   Flowsheet Row CARDIAC REHAB PHASE II ORIENTATION from 03/29/2021 in [facility]  Referring Provider Weston Brass, MD       Tammi Klippel did not complete his rehab session.  Skipper arrived to cardiac rehab complaining of not feeling tired.   He denies any chest pain. Viraat did not rest well last night and kept waking up throughout the day.  He remarks that he feels like he needs to go to his recliner and take a nap.  Advised pt who looks fatigue and complains of feeling tired to not exercise and return on Monday. Alanson Aly, BSN Cardiac and Emergency planning/management officer

## 2021-05-16 ENCOUNTER — Encounter (HOSPITAL_COMMUNITY)
Admission: RE | Admit: 2021-05-16 | Discharge: 2021-05-16 | Disposition: A | Discharge status: Home / Self Care | Payer: Medicare Other | Source: Ambulatory Visit | Attending: Internal Medicine | Admitting: Internal Medicine

## 2021-05-16 ENCOUNTER — Other Ambulatory Visit: Payer: Self-pay

## 2021-05-16 DIAGNOSIS — Z5189 Encounter for other specified aftercare: Secondary | ICD-10-CM | POA: Diagnosis not present

## 2021-05-16 DIAGNOSIS — I252 Old myocardial infarction: Secondary | ICD-10-CM | POA: Diagnosis not present

## 2021-05-16 DIAGNOSIS — Z955 Presence of coronary angioplasty implant and graft: Secondary | ICD-10-CM

## 2021-05-16 DIAGNOSIS — I214 Non-ST elevation (NSTEMI) myocardial infarction: Secondary | ICD-10-CM

## 2021-05-18 ENCOUNTER — Other Ambulatory Visit: Payer: Self-pay

## 2021-05-18 ENCOUNTER — Encounter (HOSPITAL_COMMUNITY)
Admission: RE | Admit: 2021-05-18 | Discharge: 2021-05-18 | Disposition: A | Discharge status: Home / Self Care | Payer: Medicare Other | Source: Ambulatory Visit | Attending: Internal Medicine | Admitting: Internal Medicine

## 2021-05-18 DIAGNOSIS — I214 Non-ST elevation (NSTEMI) myocardial infarction: Secondary | ICD-10-CM

## 2021-05-18 DIAGNOSIS — Z5189 Encounter for other specified aftercare: Secondary | ICD-10-CM | POA: Diagnosis not present

## 2021-05-18 DIAGNOSIS — Z955 Presence of coronary angioplasty implant and graft: Secondary | ICD-10-CM

## 2021-05-18 DIAGNOSIS — I252 Old myocardial infarction: Secondary | ICD-10-CM | POA: Diagnosis not present

## 2021-05-20 ENCOUNTER — Other Ambulatory Visit: Payer: Self-pay

## 2021-05-20 ENCOUNTER — Encounter (HOSPITAL_COMMUNITY)
Admission: RE | Admit: 2021-05-20 | Discharge: 2021-05-20 | Disposition: A | Discharge status: Home / Self Care | Payer: Medicare Other | Source: Ambulatory Visit | Attending: Internal Medicine | Admitting: Internal Medicine

## 2021-05-20 DIAGNOSIS — I214 Non-ST elevation (NSTEMI) myocardial infarction: Secondary | ICD-10-CM

## 2021-05-20 DIAGNOSIS — I252 Old myocardial infarction: Secondary | ICD-10-CM | POA: Diagnosis not present

## 2021-05-20 DIAGNOSIS — Z955 Presence of coronary angioplasty implant and graft: Secondary | ICD-10-CM

## 2021-05-20 DIAGNOSIS — Z5189 Encounter for other specified aftercare: Secondary | ICD-10-CM | POA: Diagnosis not present

## 2021-05-22 NOTE — Progress Notes (Signed)
Cardiology Office Note:    Date:  05/24/2021   ID:  Jacob Ruiz, DOB Oct 16, 1945, MRN [id number]  PCP:  Sigmund Hazel, MD  Cardiologist:  Parke Poisson, MD  Electrophysiologist:  None   Referring MD: Sigmund Hazel, MD   Chief Complaint/Reason for Referral: S/p NSTEMI  History of Present Illness:    Jacob Ruiz is a 75 y.o. male with a history of HTN, COPD, and tobacco use.  He had an NSTEMI and on December 31, 2020, he underwent PCI x 2 to the left circumflex.   05/24/21: Orthopnea- can't lay on back, he has to lay on his right side which now leads to sore ribs. >3-4 years ago had initial onset PND. He has to sit up in bed to regain his breath, uses rescue inhaler and feels better. Still gets chest pain when upset. He will take a nitro and feels better within minutes. He has no exertional chest pain or SOB and participates in cardiac rehab without limitation. He notes he takes "4 pills each day" and refuses to take anymore than that. We discussed critical medications including DAPT, as well as antianginal therapy to avoid further hospitalization for symptoms only. We contacted his wife to review his pill bottles from home. Per her report he is compliant with prescribed medications. He is unable to tell me if he feels better with addition of Imdur at last visit.  He does not currently take daily inhalers for obstructive lung disease and no PFTs to review for symptoms.   The patient denies dyspnea at rest or with exertion, palpitations, or leg swelling. Denies cough, fever, chills. Denies nausea, vomiting. Denies syncope or presyncope. Denies dizziness or lightheadedness.   Past Medical History:  Diagnosis Date   Chronic bronchitis (HCC)    "get it just about q yr" (03/26/2014)   Coronary artery disease    H/O hiatal hernia    Hyperlipidemia     Past Surgical History:  Procedure Laterality Date   CARDIAC CATHETERIZATION     CORONARY STENT INTERVENTION N/A 12/31/2020    Procedure: CORONARY STENT INTERVENTION;  Surgeon: Yvonne Kendall, MD;  Location: [facility] INVASIVE CV LAB;  Service: Cardiovascular;  Laterality: N/A;   KNEE ARTHROSCOPY Right 05/26/1969   LEFT HEART CATH AND CORONARY ANGIOGRAPHY N/A 12/31/2020   Procedure: LEFT HEART CATH AND CORONARY ANGIOGRAPHY;  Surgeon: Yvonne Kendall, MD;  Location: [facility] INVASIVE CV LAB;  Service: Cardiovascular;  Laterality: N/A;   TONSILLECTOMY  05/26/1949    Current Medications: Current Meds  Medication Sig   amLODipine (NORVASC) 10 MG tablet Take 1 tablet (10 mg total) by mouth daily.   aspirin EC 81 MG tablet Take 81 mg by mouth daily. Swallow whole.   atorvastatin (LIPITOR) 40 MG tablet Take 1 tablet (40 mg total) by mouth daily.   cetirizine (ZYRTEC ALLERGY) 10 MG tablet Take 1 tablet (10 mg total) by mouth daily as needed for allergies (itching).   clopidogrel (PLAVIX) 75 MG tablet Take 1 tablet (75 mg total) by mouth daily.   isosorbide mononitrate (IMDUR) 30 MG 24 hr tablet Take 1 tablet (30 mg total) by mouth daily.   nicotine (NICODERM CQ - DOSED IN MG/24 HOURS) 21 mg/24hr patch Place 1 patch (21 mg total) onto the skin daily.   nitroGLYCERIN (NITROSTAT) 0.4 MG SL tablet Place 1 tablet (0.4 mg total) under the tongue every 5 (five) minutes as needed for chest pain. YOU MAY TAKE (1) TABLET EVERY 5 MINS AS NEEDED FOR CHEST  PAIN. DO NOT EXCEED 3 DOSES.   predniSONE (DELTASONE) 10 MG tablet Take 40 mg (4 tablets) by mouth for day 1-5 then 20 mg (2 tablets) by mouth for day 6-10.     Allergies:   Lisinopril, Ace inhibitors, and Aleve [naproxen sodium]   Social History   Tobacco Use   Smoking status: Every Day    Packs/day: 0.50    Years: 54.00    Pack years: 27.00    Types: Cigarettes   Smokeless tobacco: Never   Tobacco comments:    Patient given the phone number 1-800-quit-now. Patient has no desire to quit  Substance Use Topics   Alcohol use: No   Drug use: No     Family History: The patient's  family history includes Heart disease in his father; Heart failure in his mother.  ROS:   Please see the history of present illness.    (+) Chest pain (+) Stress/Anxiety (+) LE edema (+) Varicose veins (+) Shortness of breath (+) PND All other systems reviewed and are negative.  EKGs/Labs/Other Studies Reviewed:    The following studies were reviewed today:  LHC 12/31/2020: Conclusions: Significant two-vessel coronary artery disease, including sequential 99% proximal/mid and 70% mid/distal LCx lesions as well as 80-90% ostial rPDA stenosis.  Moderate diffuse disease noted in the LAD. Normal left ventricular filling pressure. Successful PCI to LCx using non-overlapping Resolute Onyx 2.25 x 15 mm (proximal/mid, post-dilated to 2.5 mm) and 2.0 x 12 mm (mid/distal, post-dilated to 2.2 mm) drug-eluting stents with 0% residual stenosis and TIMI-3 flow.   Recommendations: Dual antiplatelet therapy with aspirin and ticagrelor for at least 12 months. Aggressive secondary prevention.  If the patient has persistent angina refractory to aggressive medical therapy, PCI to ostial rPDA could be considered.   Echo 12/31/2020: 1. Left ventricular ejection fraction, by estimation, is 60 to 65%. The  left ventricle has normal function. The left ventricle has no regional  wall motion abnormalities. There is mild left ventricular hypertrophy.  Left ventricular diastolic parameters  are indeterminate.   2. Right ventricular systolic function is normal. The right ventricular  size is mildly enlarged. There is mildly elevated pulmonary artery  systolic pressure. The estimated right ventricular systolic pressure is  38.8 mmHg.   3. The mitral valve is normal in structure. No evidence of mitral  stenosis.   4. The aortic valve was not well visualized. Aortic valve regurgitation  is not visualized. Mild aortic valve stenosis. MG , AVA 1.4 cm^2, DI  0.53   5. The inferior vena cava is normal in size  with greater than 50%  respiratory variability, suggesting right atrial pressure of 3 mmHg.   CT Coronary FFR 11/08/2020: Possible hemodynamically significant stenosis in a branch off the PLV. Borderline hemodynamically significant stenosis in the mid LCx. Depending on symptoms, these findings could be managed medically.  CT Coronary Morph 11/08/2020: 1. Coronary artery calcium score 4057 Agatston units. This places the patient in the 97th percentile for age and gender, suggesting high risk for future cardiac events.   2. Very difficult study to interpret due to very high calcium score.   3. Suspect there is not hemodynamically significant stenosis in the RCA, but difficult to comment on the more distal PDA and PLV due to blooming artifact.   4.  There does not appear to be severe disease in the LAD system.   5. Difficult to comment on proximal to mid LCX due to extensive blooming artifact.    EKG:  NSR, iRBBB, LAFB, borderline ST depressions. 01/17/2021: Normal sinus rhythm, mild ST depressions inferiorly   I have independently reviewed the images from [facility] 12/31/20.  Recent Labs: 12/29/2020: ALT 18 12/30/2020: TSH 0.938 04/24/2021: BUN 8; Creatinine, Ser 0.69; Hemoglobin 14.7; Platelets 203; Potassium 4.0; Sodium 139  Recent Lipid Panel    Component Value Date/Time   CHOL 140 12/30/2020 0127   TRIG 39 12/30/2020 0127   TRIG 55 07/30/2006 0958   HDL 59 12/30/2020 0127   CHOLHDL 2.4 12/30/2020 0127   VLDL 8 12/30/2020 0127   LDLCALC 73 12/30/2020 0127   LDLDIRECT 150.1 07/30/2006 0958    Physical Exam:    VS:  BP 138/70 (BP Location: Right Arm, Patient Position: Sitting, Cuff Size: Normal)   Pulse 60   Ht 5' 9.5" (1.765 m)   Wt 190 lb (86.2 kg)   SpO2 98%   BMI 27.66 kg/m     Wt Readings from Last 5 Encounters:  05/24/21 190 lb (86.2 kg)  03/29/21 195 lb 8.8 oz (88.7 kg)  03/11/21 192 lb 9.6 oz (87.4 kg)  01/17/21 191 lb 6.4 oz (86.8 kg)  01/01/21 192 lb 14.4 oz  (87.5 kg)    Constitutional: No acute distress Eyes: sclera non-icteric, normal conjunctiva and lids ENMT: normal dentition, moist mucous membranes Cardiovascular: regular rhythm, normal rate, no murmurs. S1 and S2 normal. Radial pulses normal bilaterally. No jugular venous distention.  Respiratory: clear to auscultation bilaterally GI : normal bowel sounds, soft and nontender. No distention.   MSK: extremities warm, well perfused. Varicose veins bilaterally. Mild bilateral LE edema.  NEURO: grossly nonfocal exam, moves all extremities. PSYCH: alert and oriented x 3, normal mood and affect.   ASSESSMENT:    1. Coronary artery disease of native artery of native heart with stable angina pectoris (HCC)   2. Cardiac chest pain   3. NSTEMI (non-ST elevated myocardial infarction) (HCC)   4. Primary hypertension   5. Pure hypercholesterolemia   6. Medication management   7. Nonrheumatic aortic valve stenosis   8. Orthopnea     PLAN:    Cardiac chest pain Coronary artery disease of native artery of native heart with stable angina pectoris (HCC) NSTEMI (non-ST elevated myocardial infarction) [facility]) Medication management Primary hypertension Pure hypercholesterolemia - continue DAPT for 1 year, which will be completed on 12/31/21. ASA is indefinite therapy. ASA and plavix. - I have instructed the patient that dual antiplatelet therapy should be taken for 1 year without interruption.  We have discussed the consequences of interrupted dual antiplatelet therapy and the risk for in-stent thrombosis.  - continue amlodipine and atorvastatin.  - Imdur 30 mg daily started, unclear if helpful but tolerating well.   LE Edema -no complaints of this today.  Mild AS - borderline on recent echo, will recheck echo in 2-3 years.   Orthopnea - Unclear etiology but suspect some contribution of obstruction lung disease from long history of smoking. I have asked him to speak to his PMD regarding pulmonary  workup since his symptoms mostly respond to albuterol inhaler - may require maintenance inhaler therapy.   Total time of encounter: 30 minutes total time of encounter, including 20 minutes spent in face-to-face patient care on the date of this encounter. This time includes coordination of care and counseling regarding above mentioned problem list. Remainder of non-face-to-face time involved reviewing chart documents/testing relevant to the patient encounter and documentation in the medical record. I have independently reviewed documentation from referring provider.  Weston Brass, MD, [facility]  [facility]    Medication Adjustments/Labs and Tests Ordered: Current medicines are reviewed at length with the patient today.  Concerns regarding medicines are outlined above.   Orders Placed This Encounter  Procedures   EKG 12-Lead     No orders of the defined types were placed in this encounter.   Patient Instructions  Medication Instructions:  No Changes In Medications at this time.  *If you need a refill on your cardiac medications before your next appointment, please call your pharmacy*  Follow-Up: At [facility], you and your health needs are our priority.  As part of our continuing mission to provide you with exceptional heart care, we have created designated Provider Care Teams.  These Care Teams include your primary Cardiologist (physician) and Advanced Practice Providers (APPs -  Physician Assistants and Nurse Practitioners) who all work together to provide you with the care you need, when you need it.  Your next appointment:   6 month(s)  The format for your next appointment:   In Person  Provider:   Weston Brass, MD  PLEASE CALL YOUR PRIMARY CARE REGARDING YOUR SHORTNESS OF BREATH AT NIGHTTIME.    NITROGLYCERIN  is a type of vasodilator. It relaxes blood vessels, increasing the blood and oxygen supply to your heart. This medicine is used to relieve  chest pain caused by angina.  In an angina attack (CHEST PAIN), you should feel better within 5 minutes after your first dose. Do not swallow whole. Place tablet under your tongue. Sit down when taking this medicine. You can take a dose every 5 minutes up to a total of 3 doses. If you do not feel better or feel worse after 1 dose, call 9-1-1 at once. Do not take more than 3 doses in 15 minutes. Do not take your medicine more often than directed.

## 2021-05-23 ENCOUNTER — Telehealth (HOSPITAL_COMMUNITY): Payer: Self-pay | Admitting: *Deleted

## 2021-05-23 ENCOUNTER — Encounter (HOSPITAL_COMMUNITY): Payer: Medicare Other

## 2021-05-23 NOTE — Telephone Encounter (Signed)
Attempted to return Jacob Ruiz's call from earlier today.  Unable to reach him.  Message left for pt to please call us back regarding his shortness of breath.  Contact information provided. Alanson Aly, BSN Cardiac and Emergency planning/management officer

## 2021-05-23 NOTE — Telephone Encounter (Signed)
Patient left message on department voicemail that he would be absent from cardiac rehab today. Patient's states he's been up since 2AM because of shortness of breath.

## 2021-05-24 ENCOUNTER — Encounter: Payer: Self-pay | Admitting: Internal Medicine

## 2021-05-24 ENCOUNTER — Other Ambulatory Visit: Payer: Self-pay

## 2021-05-24 ENCOUNTER — Ambulatory Visit: Payer: Medicare Other | Admitting: Internal Medicine

## 2021-05-24 VITALS — BP 138/70 | HR 60 | Ht 69.5 in | Wt 190.0 lb

## 2021-05-24 DIAGNOSIS — I350 Nonrheumatic aortic (valve) stenosis: Secondary | ICD-10-CM | POA: Diagnosis not present

## 2021-05-24 DIAGNOSIS — R0601 Orthopnea: Secondary | ICD-10-CM

## 2021-05-24 DIAGNOSIS — R079 Chest pain, unspecified: Secondary | ICD-10-CM

## 2021-05-24 DIAGNOSIS — I10 Essential (primary) hypertension: Secondary | ICD-10-CM | POA: Diagnosis not present

## 2021-05-24 DIAGNOSIS — I25118 Atherosclerotic heart disease of native coronary artery with other forms of angina pectoris: Secondary | ICD-10-CM | POA: Diagnosis not present

## 2021-05-24 DIAGNOSIS — I214 Non-ST elevation (NSTEMI) myocardial infarction: Secondary | ICD-10-CM | POA: Diagnosis not present

## 2021-05-24 DIAGNOSIS — E7800 Pure hypercholesterolemia, unspecified: Secondary | ICD-10-CM

## 2021-05-24 DIAGNOSIS — Z79899 Other long term (current) drug therapy: Secondary | ICD-10-CM | POA: Diagnosis not present

## 2021-05-24 NOTE — Patient Instructions (Signed)
Medication Instructions:  No Changes In Medications at this time.  *If you need a refill on your cardiac medications before your next appointment, please call your pharmacy*  Follow-Up: At [facility], you and your health needs are our priority.  As part of our continuing mission to provide you with exceptional heart care, we have created designated Provider Care Teams.  These Care Teams include your primary Cardiologist (physician) and Advanced Practice Providers (APPs -  Physician Assistants and Nurse Practitioners) who all work together to provide you with the care you need, when you need it.  Your next appointment:   6 month(s)  The format for your next appointment:   In Person  Provider:   Weston Brass, MD  PLEASE CALL YOUR PRIMARY CARE REGARDING YOUR SHORTNESS OF BREATH AT NIGHTTIME.    NITROGLYCERIN  is a type of vasodilator. It relaxes blood vessels, increasing the blood and oxygen supply to your heart. This medicine is used to relieve chest pain caused by angina.  In an angina attack (CHEST PAIN), you should feel better within 5 minutes after your first dose. Do not swallow whole. Place tablet under your tongue. Sit down when taking this medicine. You can take a dose every 5 minutes up to a total of 3 doses. If you do not feel better or feel worse after 1 dose, call 9-1-1 at once. Do not take more than 3 doses in 15 minutes. Do not take your medicine more often than directed.

## 2021-05-25 ENCOUNTER — Encounter (HOSPITAL_COMMUNITY)
Admission: RE | Admit: 2021-05-25 | Discharge: 2021-05-25 | Disposition: A | Discharge status: Home / Self Care | Payer: Medicare Other | Source: Ambulatory Visit | Attending: Internal Medicine | Admitting: Internal Medicine

## 2021-05-25 VITALS — Ht 70.0 in | Wt 191.1 lb

## 2021-05-25 DIAGNOSIS — Z955 Presence of coronary angioplasty implant and graft: Secondary | ICD-10-CM | POA: Diagnosis not present

## 2021-05-25 DIAGNOSIS — I214 Non-ST elevation (NSTEMI) myocardial infarction: Secondary | ICD-10-CM

## 2021-05-25 DIAGNOSIS — I252 Old myocardial infarction: Secondary | ICD-10-CM | POA: Diagnosis not present

## 2021-05-25 DIAGNOSIS — Z5189 Encounter for other specified aftercare: Secondary | ICD-10-CM | POA: Diagnosis not present

## 2021-05-25 NOTE — Progress Notes (Signed)
Jacob Ruiz returned to exercise today.  Pt up at 4 am with soreness to his right side under his arm.  Hurts when he presses on it and when he moved. Seen on yesterday by Dr. Jacques Navy recommended pulmonary work up.  Jacob Ruiz states that his wife is calling the primary MD office for an appt for chest xray.  Suggested to Jacob Ruiz that he may need to see a pulmonologist for pulmonary workup due to his long standing history and current smoking.  He stated that his good friend just died of COPD and he hope he doesn't have it.  Asked had he smoked prior to exercise, he said that he had not.  Asked about readiness to quit - pt not interested.  Jacob Ruiz completed light exercise today at reduced modified level.Alanson Aly, BSN Cardiac and Emergency planning/management officer

## 2021-05-27 ENCOUNTER — Other Ambulatory Visit: Payer: Self-pay

## 2021-05-27 ENCOUNTER — Encounter (HOSPITAL_COMMUNITY)
Admission: RE | Admit: 2021-05-27 | Discharge: 2021-05-27 | Disposition: A | Discharge status: Home / Self Care | Payer: Medicare Other | Source: Ambulatory Visit | Attending: Internal Medicine | Admitting: Internal Medicine

## 2021-05-27 DIAGNOSIS — R0781 Pleurodynia: Secondary | ICD-10-CM | POA: Diagnosis not present

## 2021-05-27 DIAGNOSIS — Z955 Presence of coronary angioplasty implant and graft: Secondary | ICD-10-CM | POA: Diagnosis not present

## 2021-05-27 DIAGNOSIS — I214 Non-ST elevation (NSTEMI) myocardial infarction: Secondary | ICD-10-CM | POA: Insufficient documentation

## 2021-05-27 NOTE — Progress Notes (Signed)
Jacob Ruiz in today for his last exercise session with cardiac rehab.  Pt completed walk test which the Exercise Physiologist stopped at the 5/6 minute mark due to decrease O2 saturation to 84% at the 5 minute mark, observation of severe dyspnea and fatigue.  Post walk test data as follows:     Pre:                                BP               HR                 O2 saturation 140/68   67  96 1 minute                                              68                   92 2 minute     70  91 3 minute       88 4 minute      85 5 minute      84 Walk test stopped unable to speak due to shortness of breath dyspnea scale 4  5 minute rest break needed in order for Jacob Ruiz to recover 158/78 93%  Able to complete 15 minute station at much reduced workload and pace. Noted that Jacob Ruiz has chest exray later today at 12:30 for complaint of right rib discomfort and shortness of breath.  Will notify Dr. Hyacinth Meeker, PCP at [facility]. Alanson Aly, BSN Cardiac and Emergency planning/management officer

## 2021-05-31 NOTE — Progress Notes (Signed)
Discharge Progress Report  Patient Details  Name: Jacob Ruiz MRN: [id number] Date of Birth: 13-Jan-1946 Referring Provider:   Flowsheet Row CARDIAC REHAB PHASE II ORIENTATION from 03/29/2021 in [place]  Referring Provider Cherlynn Kaiser, MD        Number of Visits: 18  Reason for Discharge:  Patient has met program and personal goals.  Smoking History:  Social History   Tobacco Use  Smoking Status Every Day   Packs/day: 0.50   Years: 54.00   Pack years: 27.00   Types: Cigarettes  Smokeless Tobacco Never  Tobacco Comments   Patient given the phone number 1-800-quit-now. Patient has no desire to quit    Diagnosis:  12/29/20 NSTEMI   12/31/20 S/P DES LCX x 2   ADL UCSD:   Initial Exercise Prescription:  Initial Exercise Prescription - 03/29/21 1600       Date of Initial Exercise RX and Referring Provider   Date 03/29/21    Referring Provider Cherlynn Kaiser, MD    Expected Discharge Date 05/27/21      NuStep   Level 2    SPM 75    Minutes 15    METs 2      Arm Ergometer   Level 2    Minutes 15    METs 2      Prescription Details   Frequency (times per week) 3    Duration Progress to 30 minutes of continuous aerobic without signs/symptoms of physical distress      Intensity   THRR 40-80% of Max Heartrate 58-117    Ratings of Perceived Exertion 11-13    Perceived Dyspnea 0-4      Progression   Progression Continue progressive overload as per policy without signs/symptoms or physical distress.      Resistance Training   Training Prescription Yes    Weight 4 lbs    Reps 10-15             Discharge Exercise Prescription (Final Exercise Prescription Changes):  Exercise Prescription Changes - 05/27/21 1200       Response to Exercise   Blood Pressure (Admit) 140/68    Blood Pressure (Exercise) 158/78    Blood Pressure (Exit) 140/80    Heart Rate (Admit) 68 bpm    Heart Rate (Exercise) 78 bpm    Heart  Rate (Exit) 62 bpm    Oxygen Saturation (Admit) 94 %    Oxygen Saturation (Exercise) 84 %    Rating of Perceived Exertion (Exercise) 11    Symptoms SOB, RPD = 2    Comments Pt graduated from the [facility] program today.    Duration Continue with 30 min of aerobic exercise without signs/symptoms of physical distress.    Intensity THRR unchanged      Progression   Progression Continue to progress workloads to maintain intensity without signs/symptoms of physical distress.    Average METs 2.74      Resistance Training   Training Prescription Yes    Weight 4 lbs    Reps 10-15    Time 10 Minutes      Interval Training   Interval Training No      NuStep   Level 1    SPM 85    Minutes 15    METs 1.7      Home Exercise Plan   Plans to continue exercise at Home (comment)    Frequency Add 2 additional days to program exercise sessions.  Initial Home Exercises Provided 04/22/21             Functional Capacity:  6 Minute Walk     Row Name 03/29/21 1542 05/27/21 0915       6 Minute Walk   Phase Initial Discharge    Distance 1441 feet 1000 feet    Distance % Change -- -30.6 %    Distance Feet Change -- -441 ft    Walk Time 6 minutes 6 minutes  Test terminated by CEP at 5 minutes due to low SaO2/and visible SOB    # of Rest Breaks 0 1  test terminated by CEP @ 5:00    MPH 2.73 1.89    METS 2.71 2.16    RPE 11 11    Perceived Dyspnea  1 2    VO2 Peak 9047 7.57    Symptoms Yes (comment) Yes (comment)    Comments SOB, RPD = 1.  Denies pain SOB, RPD = 2    Resting HR 61 bpm 67 bpm    Resting BP 108/72 140/68    Resting Oxygen Saturation  94 % 94 %    Exercise Oxygen Saturation  during 6 min walk 95 % 84 %    Max Ex. HR 71 bpm 78 bpm    Max Ex. BP 140/70 158/78    2 Minute Post BP 114/62 --         Interval Oxygen   Interval Oxygen? -- Yes    1 Minute Oxygen Saturation % -- 92 %    1 Minute Liters of Oxygen -- 0 L    2 Minute Oxygen Saturation % -- 91 %    2 Minute  Liters of Oxygen -- 0 L    3 Minute Oxygen Saturation % -- 88 %    3 Minute Liters of Oxygen -- 0 L    4 Minute Oxygen Saturation % -- 85 %    4 Minute Liters of Oxygen -- 0 L    5 Minute Oxygen Saturation % -- 84 %  Test terminated at this point by CEP    5 Minute Liters of Oxygen -- 0 L             Psychological, QOL, Others - Outcomes: PHQ 2/9: Depression screen [facility] 2/9 05/27/2021 05/27/2021 03/29/2021  Decreased Interest 0 0 0  Down, Depressed, Hopeless 0 - 0  PHQ - 2 Score 0 0 0  Altered sleeping 2 0 -  Tired, decreased energy 1 1 -  Change in appetite 0 0 -  Feeling bad or failure about yourself  1 0 -  Trouble concentrating - 1 -  Moving slowly or fidgety/restless - 0 -  Suicidal thoughts - 0 -  PHQ-9 Score 4 2 -    Quality of Life:  Quality of Life - 05/18/21 1030       Quality of Life   Select Quality of Life      Quality of Life Scores   Health/Function Pre 8.5 %    Health/Function Post 17.4 %    Health/Function % Change 104.71 %    Socioeconomic Pre 11.94 %    Socioeconomic Post 17.93 %    Socioeconomic % Change  50.17 %    Psych/Spiritual Pre 12.86 %    Psych/Spiritual Post 20.21 %    Psych/Spiritual % Change 57.15 %    Family Pre 23.3 %    Family Post 22.6 %    Family % Change -3 %  GLOBAL Pre 12.27 %    GLOBAL Post 18.85 %    GLOBAL % Change 53.63 %             Personal Goals: Goals established at orientation with interventions provided to work toward goal.  Personal Goals and Risk Factors at Admission - 03/29/21 1621       Core Components/Risk Factors/Patient Goals on Admission    Weight Management Yes;Weight Loss    Intervention Weight Management: Develop a combined nutrition and exercise program designed to reach desired caloric intake, while maintaining appropriate intake of nutrient and fiber, sodium and fats, and appropriate energy expenditure required for the weight goal.;Weight Management: Provide education and appropriate resources  to help participant work on and attain dietary goals.;Weight Management/Obesity: Establish reasonable short term and long term weight goals.    Admit Weight 195 lb 8.8 oz (88.7 kg)    Expected Outcomes Short Term: Continue to assess and modify interventions until short term weight is achieved;Long Term: Adherence to nutrition and physical activity/exercise program aimed toward attainment of established weight goal;Weight Maintenance: Understanding of the daily nutrition guidelines, which includes 25-35% calories from fat, 7% or less cal from saturated fats, less than 235m cholesterol, less than 1.5gm of sodium, & 5 or more servings of fruits and vegetables daily;Weight Loss: Understanding of general recommendations for a balanced deficit meal plan, which promotes 1-2 lb weight loss per week and includes a negative energy balance of [phone] kcal/d;Understanding recommendations for meals to include 15-35% energy as protein, 25-35% energy from fat, 35-60% energy from carbohydrates, less than 2019mof dietary cholesterol, 20-35 gm of total fiber daily;Understanding of distribution of calorie intake throughout the day with the consumption of 4-5 meals/snacks    Tobacco Cessation Yes    Number of packs per day .50    Intervention Assist the participant in steps to quit. Provide individualized education and counseling about committing to Tobacco Cessation, relapse prevention, and pharmacological support that can be provided by physician.;OfAdvice workerassist with locating and accessing local/national Quit Smoking programs, and support quit date choice.    Expected Outcomes Short Term: Will demonstrate readiness to quit, by selecting a quit date.;Short Term: Will quit all tobacco product use, adhering to prevention of relapse plan.;Long Term: Complete abstinence from all tobacco products for at least 12 months from quit date.    Hypertension Yes    Intervention Provide education on lifestyle  modifcations including regular physical activity/exercise, weight management, moderate sodium restriction and increased consumption of fresh fruit, vegetables, and low fat dairy, alcohol moderation, and smoking cessation.;Monitor prescription use compliance.    Expected Outcomes Short Term: Continued assessment and intervention until BP is < 140/9067mG in hypertensive participants. < 130/76m29m in hypertensive participants with diabetes, heart failure or chronic kidney disease.;Long Term: Maintenance of blood pressure at goal levels.    Lipids Yes    Intervention Provide education and support for participant on nutrition & aerobic/resistive exercise along with prescribed medications to achieve LDL <70mg59mL >40mg.64mExpected Outcomes Short Term: Participant states understanding of desired cholesterol values and is compliant with medications prescribed. Participant is following exercise prescription and nutrition guidelines.;Long Term: Cholesterol controlled with medications as prescribed, with individualized exercise RX and with personalized nutrition plan. Value goals: LDL < 70mg, 24m> 40 mg.    Stress Yes    Intervention Offer individual and/or small group education and counseling on adjustment to heart disease, stress management and health-related lifestyle change. Teach and  support self-help strategies.;Refer participants experiencing significant psychosocial distress to appropriate mental health specialists for further evaluation and treatment. When possible, include family members and significant others in education/counseling sessions.    Expected Outcomes Short Term: Participant demonstrates changes in health-related behavior, relaxation and other stress management skills, ability to obtain effective social support, and compliance with psychotropic medications if prescribed.              Personal Goals Discharge:  Goals and Risk Factor Review     Row Name 04/13/21 1836 05/10/21 1206  05/27/21 1416         Core Components/Risk Factors/Patient Goals Review   Personal Goals Review Weight Management/Obesity;Stress;Lipids;Hypertension;Tobacco Cessation Weight Management/Obesity;Stress;Lipids;Hypertension;Tobacco Cessation Weight Management/Obesity;Stress;Lipids;Hypertension;Tobacco Cessation     Review JR has been doing well with exercise at cardiac rehab. Vital sings have been stable will continue to encourage smoking cessation JR has been doing well with exercise at cardiac rehab. Vital sings have been stable will continue to encourage smoking cessatio. JR has no plans on quititng smoking at this time Brooke Bonito did experience progress with his exercise but did regress toward the end as he was dealing with breathing and lung issues.  He is in the beginning stages of this work up to determine the cause. Spoke with Brooke Bonito regarding tobacco cessation - not interested in changing this behavior but did remark that if he ended up with COPD he would try to quit.  Unfortunately he was allergic to the nico derm patches.  Brooke Bonito lost 2lbs during his 8 week participation.  Bp would vary borderline hypertension stable. Lipids managment stable with medication compliance along with some dietary changes. Stress level heightened due to the uncertainity of what they may find out with his lungs.     Expected Outcomes JR will continue to partcipate in phase 2 cardiac rehab for exercise, nutrtion and lifestyle modifications JR will continue to partcipate in phase 2 cardiac rehab for exercise, nutrtion and lifestyle modifications Brooke Bonito has made significant strides toward his goals.  jr velieves once his lung issues have been determined he will be sucessful with continued adoption of a heart healthy lifestyle              Exercise Goals and Review:  Exercise Goals     Row Name 03/29/21 1625             Exercise Goals   Increase Physical Activity Yes       Intervention Provide advice, education, support and  counseling about physical activity/exercise needs.;Develop an individualized exercise prescription for aerobic and resistive training based on initial evaluation findings, risk stratification, comorbidities and participant's personal goals.       Expected Outcomes Short Term: Attend rehab on a regular basis to increase amount of physical activity.;Long Term: Add in home exercise to make exercise part of routine and to increase amount of physical activity.;Long Term: Exercising regularly at least 3-5 days a week.       Increase Strength and Stamina Yes       Intervention Provide advice, education, support and counseling about physical activity/exercise needs.;Develop an individualized exercise prescription for aerobic and resistive training based on initial evaluation findings, risk stratification, comorbidities and participant's personal goals.       Expected Outcomes Short Term: Increase workloads from initial exercise prescription for resistance, speed, and METs.;Short Term: Perform resistance training exercises routinely during rehab and add in resistance training at home;Long Term: Improve cardiorespiratory fitness, muscular endurance and strength as measured by increased METs  and functional capacity (6MWT)       Able to understand and use rate of perceived exertion (RPE) scale Yes       Intervention Provide education and explanation on how to use RPE scale       Expected Outcomes Short Term: Able to use RPE daily in rehab to express subjective intensity level;Long Term:  Able to use RPE to guide intensity level when exercising independently       Knowledge and understanding of Target Heart Rate Range (THRR) Yes       Intervention Provide education and explanation of THRR including how the numbers were predicted and where they are located for reference       Expected Outcomes Short Term: Able to state/look up THRR;Short Term: Able to use daily as guideline for intensity in rehab;Long Term: Able to use  THRR to govern intensity when exercising independently       Understanding of Exercise Prescription Yes       Intervention Provide education, explanation, and written materials on patient's individual exercise prescription       Expected Outcomes Short Term: Able to explain program exercise prescription;Long Term: Able to explain home exercise prescription to exercise independently                Exercise Goals Re-Evaluation:  Exercise Goals Re-Evaluation     [place] Name 04/04/21 1021 04/22/21 1227 05/09/21 1208 05/27/21 1613       Exercise Goal Re-Evaluation   Exercise Goals Review Increase Physical Activity;Increase Strength and Stamina;Able to understand and use rate of perceived exertion (RPE) scale;Knowledge and understanding of Target Heart Rate Range (THRR);Understanding of Exercise Prescription Increase Physical Activity;Increase Strength and Stamina;Able to understand and use rate of perceived exertion (RPE) scale;Able to understand and use Dyspnea scale;Able to check pulse independently;Understanding of Exercise Prescription Increase Physical Activity;Increase Strength and Stamina;Able to understand and use rate of perceived exertion (RPE) scale;Knowledge and understanding of Target Heart Rate Range (THRR);Able to check pulse independently;Understanding of Exercise Prescription Increase Physical Activity;Increase Strength and Stamina;Able to understand and use rate of perceived exertion (RPE) scale;Knowledge and understanding of Target Heart Rate Range (THRR);Understanding of Exercise Prescription    Comments Pt's first day in the [facility] program. Pt understands the exercise Rx, THRR, and RPE scale. Reviewed home exercise Rx and METs. Pt encouraged to begin walking 2-3x/week for 30 minutes. Pt voices already walking in the morning around the yard. Reviewed METs and goals. Pt working at lower spm today due to sore ribs from installing a dog pen. Pt walking at home and walking with the dog now  too. Pt graduated from the [facility] program today. Pt has struggled with his workout here due to sore, painful ribs and SOB. Pt has had to decrease his workloads due to this issue. Pt has followed up with cardiology who feels this is not a cardiac issue. Pt will get an X-ray today as orded by the PCP. Pt encouraged to walk at home when feeling better.    Expected Outcomes Will continue to monitor patient and increase exercise workloads as tolerated. Pt will begin to walk ona more regular basis at home. Will continue to monitoe and progess workloads as tolerated. Pt to continue to walk at home when feeling better.             Nutrition & Weight - Outcomes:  Pre Biometrics - 03/29/21 1430       Pre Biometrics   Waist Circumference 42.25 inches  Hip Circumference 42.25 inches    Waist to Hip Ratio 1 %    Triceps Skinfold 19 mm    % Body Fat 29.7 %    Grip Strength 46 kg    Flexibility 14.5 in    Single Leg Stand 3.24 seconds             Post Biometrics - 05/25/21 0915        Post  Biometrics   Height _0  (1.778 m)    Weight 86.7 kg    Waist Circumference 42 inches    Hip Circumference 42 inches    Waist to Hip Ratio 1 %    BMI (Calculated) 27.43    Triceps Skinfold 18 mm    % Body Fat 29.2 %    Grip Strength 41 kg    Flexibility 15.75 in    Single Leg Stand 9.47 seconds             Nutrition:  Nutrition Therapy & Goals - 04/06/21 1044       Nutrition Therapy   Diet TLC    Drug/Food Interactions Statins/Certain Fruits      Personal Nutrition Goals   Nutrition Goal Pt to identify food quantities necessary to achieve weight loss of 6-24 lb at graduation from cardiac rehab.    Personal Goal #2 Pt to build a healthy plate including vegetables, fruits, whole grains, and low-fat dairy products in a heart healthy meal plan.    Personal Goal #3 Pt to reduce processed meats to 2 times per week      Intervention Plan   Intervention Prescribe, educate and counsel  regarding individualized specific dietary modifications aiming towards targeted core components such as weight, hypertension, lipid management, diabetes, heart failure and other comorbidities.;Nutrition handout(s) given to patient.    Expected Outcomes Short Term Goal: A plan has been developed with personal nutrition goals set during dietitian appointment.;Long Term Goal: Adherence to prescribed nutrition plan.             Nutrition Discharge:   Education Questionnaire Score:  Knowledge Questionnaire Score - 05/18/21 1032       Knowledge Questionnaire Score   Post Score 22/28             Goals reviewed with patient; copy given to patient.Pt graduated from cardiac rehab program on 05/27/21 with completion of 18 exercise sessions in Phase II. Pt maintained good attendance and progressed nicely during his participation in rehab as evidenced by increased MET level.   Medication list reconciled. Repeat  PHQ score- 4 .  Pt has made significant lifestyle changes and should be commended for his success. Pt feels he has achieved his goals during cardiac rehab. JR distance on his post exercise walk test actually decreased due to oxygen desaturation. This information was forwarded to his primary care physician. JR lost 2 kg while in cardiac rehab. JR plans to walk as he is able. JR continues to smoke and has no plans to stop smoking at this time.Barnet Pall, RN,BSN 05/31/2021 9:17 AM

## 2021-06-05 DIAGNOSIS — R0781 Pleurodynia: Secondary | ICD-10-CM | POA: Diagnosis not present

## 2021-06-13 DIAGNOSIS — S82035D Nondisplaced transverse fracture of left patella, subsequent encounter for closed fracture with routine healing: Secondary | ICD-10-CM | POA: Diagnosis not present

## 2021-06-14 ENCOUNTER — Ambulatory Visit: Payer: Medicare Other | Admitting: Physician Assistant

## 2021-06-26 DIAGNOSIS — M545 Low back pain, unspecified: Secondary | ICD-10-CM | POA: Diagnosis not present

## 2021-07-01 DIAGNOSIS — M545 Low back pain, unspecified: Secondary | ICD-10-CM | POA: Diagnosis not present

## 2021-07-19 ENCOUNTER — Encounter (HOSPITAL_COMMUNITY): Payer: Self-pay

## 2021-07-19 ENCOUNTER — Emergency Department (HOSPITAL_COMMUNITY)
Admission: EM | Admit: 2021-07-19 | Discharge: 2021-07-19 | Disposition: A | Discharge status: Home / Self Care | Payer: Medicare Other | Attending: Emergency Medicine | Admitting: Emergency Medicine

## 2021-07-19 ENCOUNTER — Emergency Department (HOSPITAL_COMMUNITY): Payer: Medicare Other

## 2021-07-19 ENCOUNTER — Other Ambulatory Visit: Payer: Self-pay

## 2021-07-19 DIAGNOSIS — R059 Cough, unspecified: Secondary | ICD-10-CM | POA: Diagnosis not present

## 2021-07-19 DIAGNOSIS — Z5321 Procedure and treatment not carried out due to patient leaving prior to being seen by health care provider: Secondary | ICD-10-CM | POA: Diagnosis not present

## 2021-07-19 DIAGNOSIS — R0602 Shortness of breath: Secondary | ICD-10-CM | POA: Insufficient documentation

## 2021-07-19 DIAGNOSIS — R079 Chest pain, unspecified: Secondary | ICD-10-CM | POA: Diagnosis not present

## 2021-07-19 DIAGNOSIS — R0789 Other chest pain: Secondary | ICD-10-CM | POA: Insufficient documentation

## 2021-07-19 LAB — BASIC METABOLIC PANEL
Anion gap: 9 (ref 5–15)
BUN: 7 mg/dL — ABNORMAL LOW (ref 8–23)
CO2: 27 mmol/L (ref 22–32)
Calcium: 9.1 mg/dL (ref 8.9–10.3)
Chloride: 101 mmol/L (ref 98–111)
Creatinine, Ser: 0.6 mg/dL — ABNORMAL LOW (ref 0.61–1.24)
GFR, Estimated: >60 mL/min (ref >60)
Glucose, Bld: 103 mg/dL — ABNORMAL HIGH (ref 70–99)
Potassium: 3.9 mmol/L (ref 3.5–5.1)
Sodium: 137 mmol/L (ref 135–145)

## 2021-07-19 LAB — CBC
HCT: 42.6 % (ref 39.0–52.0)
Hemoglobin: 14.1 g/dL (ref 13.0–17.0)
MCH: 30.7 pg (ref 26.0–34.0)
MCHC: 33.1 g/dL (ref 30.0–36.0)
MCV: 92.8 fL (ref 80.0–100.0)
Platelets: 181 10*3/uL (ref 150–400)
RBC: 4.59 MIL/uL (ref 4.22–5.81)
RDW: 12.7 % (ref 11.5–15.5)
WBC: 6.3 10*3/uL (ref 4.0–10.5)
nRBC: 0 % (ref 0.0–0.2)

## 2021-07-19 LAB — TROPONIN I (HIGH SENSITIVITY)
Troponin I (High Sensitivity): 7 ng/L (ref <18)
Troponin I (High Sensitivity): 7 ng/L (ref <18)

## 2021-07-19 NOTE — ED Notes (Signed)
Pt left was encouraged stay

## 2021-07-19 NOTE — ED Triage Notes (Signed)
Pt reports left sided chest pain that worsens with inspiration and cough, onset 4 days ago after lifting a 35 lb water jug, associated with SOB when sleeping at night.  Non-productive cough. He states the pain feels similar to last heart attack which required stent placement.

## 2021-07-19 NOTE — ED Provider Notes (Signed)
Emergency Medicine Provider Triage Evaluation Note  Jacob Ruiz , a 75 y.o. male  was evaluated in triage.  Pt complains of chest pain.  States he began 4 days ago after lifting a heavy water jug.  History of NSTEMI April 2022 requiring catheterization and PCI to LCx.  Review of Systems  Positive: Chest pain, SOB Negative: vomiting  Physical Exam  BP (!) 163/87   Pulse 63   Temp 97.7 F (36.5 C)   Resp 17   Ht 5' 9.5" (1.765 m)   Wt 85.3 kg   SpO2 95%   BMI 27.36 kg/m  Gen:   Awake, no distress   Resp:  Normal effort  MSK:   Moves extremities without difficulty  Other:    Medical Decision Making  Medically screening exam initiated at 1:39 AM.  Appropriate orders placed.  Jacob Ruiz was informed that the remainder of the evaluation will be completed by another provider, this initial triage assessment does not replace that evaluation, and the importance of remaining in the ED until their evaluation is complete.  Chest pain after lifting water jug.  NSTEMI April 2022 with PCI to LCx.  Cardiac work-up-- EKG, labs, CXR.   Jacob Hatchet, PA-C 07/19/21 0142    Jacob Plan, DO 07/19/21 [phone]

## 2021-07-20 DIAGNOSIS — F17200 Nicotine dependence, unspecified, uncomplicated: Secondary | ICD-10-CM | POA: Diagnosis not present

## 2021-07-20 DIAGNOSIS — R079 Chest pain, unspecified: Secondary | ICD-10-CM | POA: Diagnosis not present

## 2021-07-21 ENCOUNTER — Ambulatory Visit: Payer: Medicare Other | Admitting: Internal Medicine

## 2021-08-08 ENCOUNTER — Other Ambulatory Visit: Payer: Self-pay | Admitting: Family Medicine

## 2021-08-08 DIAGNOSIS — M5136 Other intervertebral disc degeneration, lumbar region: Secondary | ICD-10-CM

## 2021-08-08 DIAGNOSIS — S32000A Wedge compression fracture of unspecified lumbar vertebra, initial encounter for closed fracture: Secondary | ICD-10-CM

## 2021-08-08 DIAGNOSIS — M545 Low back pain, unspecified: Secondary | ICD-10-CM

## 2021-08-08 DIAGNOSIS — M8588 Other specified disorders of bone density and structure, other site: Secondary | ICD-10-CM

## 2021-08-11 ENCOUNTER — Emergency Department (HOSPITAL_COMMUNITY): Payer: Medicare Other

## 2021-08-11 ENCOUNTER — Inpatient Hospital Stay (HOSPITAL_COMMUNITY): Payer: Medicare Other

## 2021-08-11 ENCOUNTER — Inpatient Hospital Stay (HOSPITAL_COMMUNITY)
Admission: EM | Admit: 2021-08-11 | Discharge: 2021-08-25 | DRG: 291 | Disposition: E | Payer: Medicare Other | Attending: Pulmonary Disease | Admitting: Pulmonary Disease

## 2021-08-11 ENCOUNTER — Encounter (HOSPITAL_COMMUNITY): Payer: Self-pay | Admitting: Emergency Medicine

## 2021-08-11 DIAGNOSIS — E785 Hyperlipidemia, unspecified: Secondary | ICD-10-CM | POA: Diagnosis not present

## 2021-08-11 DIAGNOSIS — E871 Hypo-osmolality and hyponatremia: Secondary | ICD-10-CM | POA: Diagnosis present

## 2021-08-11 DIAGNOSIS — J449 Chronic obstructive pulmonary disease, unspecified: Secondary | ICD-10-CM | POA: Diagnosis present

## 2021-08-11 DIAGNOSIS — G253 Myoclonus: Secondary | ICD-10-CM | POA: Diagnosis not present

## 2021-08-11 DIAGNOSIS — Z955 Presence of coronary angioplasty implant and graft: Secondary | ICD-10-CM

## 2021-08-11 DIAGNOSIS — I2510 Atherosclerotic heart disease of native coronary artery without angina pectoris: Secondary | ICD-10-CM | POA: Diagnosis present

## 2021-08-11 DIAGNOSIS — I499 Cardiac arrhythmia, unspecified: Secondary | ICD-10-CM | POA: Diagnosis not present

## 2021-08-11 DIAGNOSIS — I959 Hypotension, unspecified: Secondary | ICD-10-CM | POA: Diagnosis not present

## 2021-08-11 DIAGNOSIS — S2243XA Multiple fractures of ribs, bilateral, initial encounter for closed fracture: Secondary | ICD-10-CM | POA: Diagnosis not present

## 2021-08-11 DIAGNOSIS — I4891 Unspecified atrial fibrillation: Secondary | ICD-10-CM | POA: Diagnosis not present

## 2021-08-11 DIAGNOSIS — Z8249 Family history of ischemic heart disease and other diseases of the circulatory system: Secondary | ICD-10-CM | POA: Diagnosis not present

## 2021-08-11 DIAGNOSIS — J9602 Acute respiratory failure with hypercapnia: Secondary | ICD-10-CM | POA: Diagnosis present

## 2021-08-11 DIAGNOSIS — E874 Mixed disorder of acid-base balance: Secondary | ICD-10-CM | POA: Diagnosis not present

## 2021-08-11 DIAGNOSIS — I6782 Cerebral ischemia: Secondary | ICD-10-CM | POA: Diagnosis not present

## 2021-08-11 DIAGNOSIS — I5031 Acute diastolic (congestive) heart failure: Secondary | ICD-10-CM | POA: Diagnosis present

## 2021-08-11 DIAGNOSIS — K7201 Acute and subacute hepatic failure with coma: Secondary | ICD-10-CM | POA: Diagnosis not present

## 2021-08-11 DIAGNOSIS — J9690 Respiratory failure, unspecified, unspecified whether with hypoxia or hypercapnia: Secondary | ICD-10-CM | POA: Diagnosis not present

## 2021-08-11 DIAGNOSIS — I252 Old myocardial infarction: Secondary | ICD-10-CM

## 2021-08-11 DIAGNOSIS — Z7982 Long term (current) use of aspirin: Secondary | ICD-10-CM

## 2021-08-11 DIAGNOSIS — J9600 Acute respiratory failure, unspecified whether with hypoxia or hypercapnia: Secondary | ICD-10-CM

## 2021-08-11 DIAGNOSIS — K7200 Acute and subacute hepatic failure without coma: Secondary | ICD-10-CM | POA: Diagnosis not present

## 2021-08-11 DIAGNOSIS — Z5309 Procedure and treatment not carried out because of other contraindication: Secondary | ICD-10-CM

## 2021-08-11 DIAGNOSIS — Z4682 Encounter for fitting and adjustment of non-vascular catheter: Secondary | ICD-10-CM | POA: Diagnosis not present

## 2021-08-11 DIAGNOSIS — Z66 Do not resuscitate: Secondary | ICD-10-CM | POA: Diagnosis not present

## 2021-08-11 DIAGNOSIS — R001 Bradycardia, unspecified: Secondary | ICD-10-CM | POA: Diagnosis not present

## 2021-08-11 DIAGNOSIS — J9601 Acute respiratory failure with hypoxia: Secondary | ICD-10-CM | POA: Diagnosis present

## 2021-08-11 DIAGNOSIS — R739 Hyperglycemia, unspecified: Secondary | ICD-10-CM | POA: Diagnosis present

## 2021-08-11 DIAGNOSIS — M160 Bilateral primary osteoarthritis of hip: Secondary | ICD-10-CM | POA: Diagnosis not present

## 2021-08-11 DIAGNOSIS — I214 Non-ST elevation (NSTEMI) myocardial infarction: Secondary | ICD-10-CM

## 2021-08-11 DIAGNOSIS — Z20822 Contact with and (suspected) exposure to covid-19: Secondary | ICD-10-CM | POA: Diagnosis not present

## 2021-08-11 DIAGNOSIS — I110 Hypertensive heart disease with heart failure: Principal | ICD-10-CM | POA: Diagnosis present

## 2021-08-11 DIAGNOSIS — Z515 Encounter for palliative care: Secondary | ICD-10-CM

## 2021-08-11 DIAGNOSIS — I469 Cardiac arrest, cause unspecified: Secondary | ICD-10-CM

## 2021-08-11 DIAGNOSIS — Z0189 Encounter for other specified special examinations: Secondary | ICD-10-CM

## 2021-08-11 DIAGNOSIS — G931 Anoxic brain damage, not elsewhere classified: Secondary | ICD-10-CM | POA: Diagnosis present

## 2021-08-11 DIAGNOSIS — J342 Deviated nasal septum: Secondary | ICD-10-CM | POA: Diagnosis present

## 2021-08-11 DIAGNOSIS — R404 Transient alteration of awareness: Secondary | ICD-10-CM | POA: Diagnosis not present

## 2021-08-11 DIAGNOSIS — J811 Chronic pulmonary edema: Secondary | ICD-10-CM | POA: Diagnosis not present

## 2021-08-11 DIAGNOSIS — S22080A Wedge compression fracture of T11-T12 vertebra, initial encounter for closed fracture: Secondary | ICD-10-CM | POA: Diagnosis not present

## 2021-08-11 DIAGNOSIS — I462 Cardiac arrest due to underlying cardiac condition: Secondary | ICD-10-CM | POA: Diagnosis not present

## 2021-08-11 DIAGNOSIS — I6523 Occlusion and stenosis of bilateral carotid arteries: Secondary | ICD-10-CM | POA: Diagnosis not present

## 2021-08-11 DIAGNOSIS — Z79899 Other long term (current) drug therapy: Secondary | ICD-10-CM

## 2021-08-11 DIAGNOSIS — G40409 Other generalized epilepsy and epileptic syndromes, not intractable, without status epilepticus: Secondary | ICD-10-CM

## 2021-08-11 DIAGNOSIS — F17210 Nicotine dependence, cigarettes, uncomplicated: Secondary | ICD-10-CM | POA: Diagnosis present

## 2021-08-11 DIAGNOSIS — R918 Other nonspecific abnormal finding of lung field: Secondary | ICD-10-CM | POA: Diagnosis not present

## 2021-08-11 DIAGNOSIS — S0990XA Unspecified injury of head, initial encounter: Secondary | ICD-10-CM | POA: Diagnosis not present

## 2021-08-11 DIAGNOSIS — Z7902 Long term (current) use of antithrombotics/antiplatelets: Secondary | ICD-10-CM

## 2021-08-11 DIAGNOSIS — Z886 Allergy status to analgesic agent status: Secondary | ICD-10-CM

## 2021-08-11 DIAGNOSIS — N179 Acute kidney failure, unspecified: Secondary | ICD-10-CM | POA: Diagnosis present

## 2021-08-11 DIAGNOSIS — R569 Unspecified convulsions: Secondary | ICD-10-CM | POA: Diagnosis not present

## 2021-08-11 DIAGNOSIS — Z888 Allergy status to other drugs, medicaments and biological substances status: Secondary | ICD-10-CM

## 2021-08-11 DIAGNOSIS — I255 Ischemic cardiomyopathy: Secondary | ICD-10-CM | POA: Diagnosis present

## 2021-08-11 DIAGNOSIS — Z743 Need for continuous supervision: Secondary | ICD-10-CM | POA: Diagnosis not present

## 2021-08-11 DIAGNOSIS — Z452 Encounter for adjustment and management of vascular access device: Secondary | ICD-10-CM | POA: Diagnosis not present

## 2021-08-11 DIAGNOSIS — R6889 Other general symptoms and signs: Secondary | ICD-10-CM | POA: Diagnosis not present

## 2021-08-11 DIAGNOSIS — J3489 Other specified disorders of nose and nasal sinuses: Secondary | ICD-10-CM | POA: Diagnosis not present

## 2021-08-11 LAB — I-STAT ARTERIAL BLOOD GAS, ED
Acid-Base Excess: 0 mmol/L (ref 0.0–2.0)
Acid-base deficit: 8 mmol/L — ABNORMAL HIGH (ref 0.0–2.0)
Bicarbonate: 23.9 mmol/L (ref 20.0–28.0)
Bicarbonate: 30.4 mmol/L — ABNORMAL HIGH (ref 20.0–28.0)
Calcium, Ion: 1.22 mmol/L (ref 1.15–1.40)
Calcium, Ion: 1.21 mmol/L (ref 1.15–1.40)
HCT: 43 % (ref 39.0–52.0)
HCT: 39 % (ref 39.0–52.0)
Hemoglobin: 14.6 g/dL (ref 13.0–17.0)
Hemoglobin: 13.3 g/dL (ref 13.0–17.0)
O2 Saturation: 84 %
O2 Saturation: 100 %
Patient temperature: 98
Patient temperature: 97
Potassium: 4 mmol/L (ref 3.5–5.1)
Potassium: 3.6 mmol/L (ref 3.5–5.1)
Sodium: 132 mmol/L — ABNORMAL LOW (ref 135–145)
Sodium: 134 mmol/L — ABNORMAL LOW (ref 135–145)
TCO2: 26 mmol/L (ref 22–32)
TCO2: 33 mmol/L — ABNORMAL HIGH (ref 22–32)
pCO2 arterial: 83.3 mmHg (ref 32.0–48.0)
pCO2 arterial: 74 mmHg (ref 32.0–48.0)
pH, Arterial: 7.064 — CL (ref 7.350–7.450)
pH, Arterial: 7.217 — ABNORMAL LOW (ref 7.350–7.450)
pO2, Arterial: 70 mmHg — ABNORMAL LOW (ref 83.0–108.0)
pO2, Arterial: 352 mmHg — ABNORMAL HIGH (ref 83.0–108.0)

## 2021-08-11 LAB — CBC WITH DIFFERENTIAL/PLATELET
Abs Immature Granulocytes: 0 K/uL (ref 0.00–0.07)
Band Neutrophils: 7 %
Basophils Absolute: 0 K/uL (ref 0.0–0.1)
Basophils Relative: 0 %
Eosinophils Absolute: 0 K/uL (ref 0.0–0.5)
Eosinophils Relative: 0 %
HCT: 44.3 % (ref 39.0–52.0)
Hemoglobin: 13.8 g/dL (ref 13.0–17.0)
Lymphocytes Relative: 26 %
Lymphs Abs: 2.4 K/uL (ref 0.7–4.0)
MCH: 31 pg (ref 26.0–34.0)
MCHC: 31.2 g/dL (ref 30.0–36.0)
MCV: 99.6 fL (ref 80.0–100.0)
Monocytes Absolute: 0.5 K/uL (ref 0.1–1.0)
Monocytes Relative: 5 %
Neutro Abs: 6.3 K/uL (ref 1.7–7.7)
Neutrophils Relative %: 62 %
Platelets: 244 K/uL (ref 150–400)
RBC: 4.45 MIL/uL (ref 4.22–5.81)
RDW: 12.5 % (ref 11.5–15.5)
WBC: 9.2 K/uL (ref 4.0–10.5)
nRBC: 0 % (ref 0.0–0.2)
nRBC: 0 /100{WBCs}

## 2021-08-11 LAB — COMPREHENSIVE METABOLIC PANEL
ALT: 133 U/L — ABNORMAL HIGH (ref 0–44)
AST: 207 U/L — ABNORMAL HIGH (ref 15–41)
Albumin: 3.1 g/dL — ABNORMAL LOW (ref 3.5–5.0)
Alkaline Phosphatase: 111 U/L (ref 38–126)
Anion gap: 18 — ABNORMAL HIGH (ref 5–15)
BUN: 18 mg/dL (ref 8–23)
CO2: 21 mmol/L — ABNORMAL LOW (ref 22–32)
Calcium: 9.1 mg/dL (ref 8.9–10.3)
Chloride: 95 mmol/L — ABNORMAL LOW (ref 98–111)
Creatinine, Ser: 1.29 mg/dL — ABNORMAL HIGH (ref 0.61–1.24)
GFR, Estimated: 58 mL/min — ABNORMAL LOW (ref >60)
Glucose, Bld: 284 mg/dL — ABNORMAL HIGH (ref 70–99)
Potassium: 3.9 mmol/L (ref 3.5–5.1)
Sodium: 134 mmol/L — ABNORMAL LOW (ref 135–145)
Total Bilirubin: 0.8 mg/dL (ref 0.3–1.2)
Total Protein: 6.7 g/dL (ref 6.5–8.1)

## 2021-08-11 LAB — TROPONIN I (HIGH SENSITIVITY)
Troponin I (High Sensitivity): 57 ng/L — ABNORMAL HIGH (ref <18)
Troponin I (High Sensitivity): 56 ng/L — ABNORMAL HIGH (ref <18)
Troponin I (High Sensitivity): 87 ng/L — ABNORMAL HIGH (ref <18)

## 2021-08-11 LAB — CBC
HCT: 41.5 % (ref 39.0–52.0)
HCT: 40.3 % (ref 39.0–52.0)
Hemoglobin: 12.7 g/dL — ABNORMAL LOW (ref 13.0–17.0)
Hemoglobin: 12.9 g/dL — ABNORMAL LOW (ref 13.0–17.0)
MCH: 30.8 pg (ref 26.0–34.0)
MCH: 30.5 pg (ref 26.0–34.0)
MCHC: 30.6 g/dL (ref 30.0–36.0)
MCHC: 32 g/dL (ref 30.0–36.0)
MCV: 100.7 fL — ABNORMAL HIGH (ref 80.0–100.0)
MCV: 95.3 fL (ref 80.0–100.0)
Platelets: 213 10*3/uL (ref 150–400)
Platelets: 243 10*3/uL (ref 150–400)
RBC: 4.12 MIL/uL — ABNORMAL LOW (ref 4.22–5.81)
RBC: 4.23 MIL/uL (ref 4.22–5.81)
RDW: 12.4 % (ref 11.5–15.5)
RDW: 12.6 % (ref 11.5–15.5)
WBC: 9.5 10*3/uL (ref 4.0–10.5)
WBC: 12.6 10*3/uL — ABNORMAL HIGH (ref 4.0–10.5)
nRBC: 0 % (ref 0.0–0.2)
nRBC: 0 % (ref 0.0–0.2)

## 2021-08-11 LAB — ECHOCARDIOGRAM COMPLETE
AR max vel: 1.61 cm2
AV Area VTI: 1.93 cm2
AV Area mean vel: 1.76 cm2
AV Mean grad: 7 mmHg
AV Peak grad: 16.3 mmHg
Ao pk vel: 2.02 m/s
Area-P 1/2: 2.16 cm2
Calc EF: 54.5 %
Height: 69.5 in
S' Lateral: 3.15 cm
Single Plane A2C EF: 58.4 %
Single Plane A4C EF: 52.4 %
Weight: 3086.44 oz

## 2021-08-11 LAB — CREATININE, SERUM
Creatinine, Ser: 1.12 mg/dL (ref 0.61–1.24)
Creatinine, Ser: 1.6 mg/dL — ABNORMAL HIGH (ref 0.61–1.24)
GFR, Estimated: >60 mL/min (ref >60)
GFR, Estimated: 45 mL/min — ABNORMAL LOW (ref >60)

## 2021-08-11 LAB — RESP PANEL BY RT-PCR (FLU A&B, COVID) ARPGX2
Influenza A by PCR: NEGATIVE
Influenza B by PCR: NEGATIVE
SARS Coronavirus 2 by RT PCR: NEGATIVE

## 2021-08-11 LAB — CK: Total CK: 198 U/L (ref 49–397)

## 2021-08-11 LAB — CBG MONITORING, ED: Glucose-Capillary: 254 mg/dL — ABNORMAL HIGH (ref 70–99)

## 2021-08-11 LAB — BRAIN NATRIURETIC PEPTIDE: B Natriuretic Peptide: 219.6 pg/mL — ABNORMAL HIGH (ref 0.0–100.0)

## 2021-08-11 MED ORDER — ACETAMINOPHEN 325 MG PO TABS
650.0000 mg | ORAL_TABLET | ORAL | Status: DC
  Filled 2021-08-11: qty 2

## 2021-08-11 MED ORDER — DOCUSATE SODIUM 50 MG/5ML PO LIQD
100.0000 mg | Freq: Two times a day (BID) | ORAL | Status: DC
  Administered 2021-08-11 – 2021-08-14 (×6): 100 mg
  Filled 2021-08-11 (×6): qty 10

## 2021-08-11 MED ORDER — PROPOFOL 1000 MG/100ML IV EMUL
5.0000 ug/kg/min | INTRAVENOUS | Status: DC
  Administered 2021-08-11: 08:00:00 5 ug/kg/min via INTRAVENOUS

## 2021-08-11 MED ORDER — ONDANSETRON HCL 4 MG/2ML IJ SOLN: 4.0000 mg | Freq: Four times a day (QID) | INTRAMUSCULAR | Status: DC | PRN

## 2021-08-11 MED ORDER — PERFLUTREN LIPID MICROSPHERE
1.0000 mL | INTRAVENOUS | Status: AC | PRN
  Administered 2021-08-11: 13:00:00 5 mL via INTRAVENOUS
  Filled 2021-08-11: qty 10

## 2021-08-11 MED ORDER — ORAL CARE MOUTH RINSE
15.0000 mL | OROMUCOSAL | Status: DC
  Administered 2021-08-11 – 2021-08-14 (×27): 15 mL via OROMUCOSAL

## 2021-08-11 MED ORDER — ETOMIDATE 2 MG/ML IV SOLN
INTRAVENOUS | Status: AC | PRN
  Administered 2021-08-11: 15 mg via INTRAVENOUS

## 2021-08-11 MED ORDER — POLYETHYLENE GLYCOL 3350 17 G PO PACK
17.0000 g | PACK | Freq: Every day | ORAL | Status: DC
  Administered 2021-08-12 – 2021-08-14 (×3): 17 g
  Filled 2021-08-11 (×3): qty 1

## 2021-08-11 MED ORDER — MIDAZOLAM HCL 2 MG/2ML IJ SOLN
1.0000 mg | INTRAMUSCULAR | Status: DC | PRN
  Administered 2021-08-13: 1 mg via INTRAVENOUS
  Filled 2021-08-11 (×3): qty 2

## 2021-08-11 MED ORDER — MIDAZOLAM HCL 2 MG/2ML IJ SOLN
1.0000 mg | INTRAMUSCULAR | Status: AC | PRN
  Administered 2021-08-13 (×3): 1 mg via INTRAVENOUS
  Filled 2021-08-11 (×2): qty 2

## 2021-08-11 MED ORDER — FENTANYL CITRATE PF 50 MCG/ML IJ SOSY: 25.0000 ug | PREFILLED_SYRINGE | Freq: Once | INTRAMUSCULAR | Status: DC

## 2021-08-11 MED ORDER — DOCUSATE SODIUM 50 MG/5ML PO LIQD: 100.0000 mg | Freq: Two times a day (BID) | ORAL | Status: DC | PRN

## 2021-08-11 MED ORDER — ACETAMINOPHEN 160 MG/5ML PO SOLN
650.0000 mg | ORAL | Status: DC
  Administered 2021-08-11 – 2021-08-13 (×10): 650 mg
  Filled 2021-08-11 (×10): qty 20.3

## 2021-08-11 MED ORDER — NOREPINEPHRINE 4 MG/250ML-% IV SOLN
2.0000 ug/min | INTRAVENOUS | Status: DC
  Administered 2021-08-11: 20:00:00 5 ug/min via INTRAVENOUS
  Administered 2021-08-11: 11:00:00 2 ug/min via INTRAVENOUS
  Filled 2021-08-11 (×2): qty 250

## 2021-08-11 MED ORDER — DOCUSATE SODIUM 100 MG PO CAPS: 100.0000 mg | ORAL_CAPSULE | Freq: Two times a day (BID) | ORAL | Status: DC | PRN

## 2021-08-11 MED ORDER — PANTOPRAZOLE SODIUM 40 MG IV SOLR
40.0000 mg | Freq: Every day | INTRAVENOUS | Status: DC
  Administered 2021-08-11 – 2021-08-13 (×3): 40 mg via INTRAVENOUS
  Filled 2021-08-11 (×4): qty 40

## 2021-08-11 MED ORDER — LEVETIRACETAM IN NACL 1500 MG/100ML IV SOLN
1500.0000 mg | INTRAVENOUS | Status: AC
  Administered 2021-08-11: 14:00:00 1500 mg via INTRAVENOUS
  Filled 2021-08-11: qty 100

## 2021-08-11 MED ORDER — SODIUM CHLORIDE 0.9 % IV SOLN
250.0000 mL | INTRAVENOUS | Status: DC
  Administered 2021-08-11 – 2021-08-13 (×2): 250 mL via INTRAVENOUS

## 2021-08-11 MED ORDER — POLYETHYLENE GLYCOL 3350 17 G PO PACK: 17.0000 g | PACK | Freq: Every day | ORAL | Status: DC | PRN

## 2021-08-11 MED ORDER — CHLORHEXIDINE GLUCONATE CLOTH 2 % EX PADS
6.0000 | MEDICATED_PAD | Freq: Every day | CUTANEOUS | Status: DC
  Administered 2021-08-11 – 2021-08-13 (×3): 6 via TOPICAL

## 2021-08-11 MED ORDER — HEPARIN SODIUM (PORCINE) 5000 UNIT/ML IJ SOLN
5000.0000 [IU] | Freq: Three times a day (TID) | INTRAMUSCULAR | Status: DC
  Administered 2021-08-11: 12:00:00 5000 [IU] via SUBCUTANEOUS
  Filled 2021-08-11: qty 1

## 2021-08-11 MED ORDER — FENTANYL 2500MCG IN NS 250ML (10MCG/ML) PREMIX INFUSION
25.0000 ug/h | INTRAVENOUS | Status: DC
  Administered 2021-08-11: 25 ug/h via INTRAVENOUS
  Administered 2021-08-12: 75 ug/h via INTRAVENOUS
  Filled 2021-08-11 (×2): qty 250

## 2021-08-11 MED ORDER — HEPARIN SODIUM (PORCINE) 5000 UNIT/ML IJ SOLN
5000.0000 [IU] | Freq: Three times a day (TID) | INTRAMUSCULAR | Status: DC
  Administered 2021-08-11 – 2021-08-14 (×8): 5000 [IU] via SUBCUTANEOUS
  Filled 2021-08-11 (×7): qty 1

## 2021-08-11 MED ORDER — MAGNESIUM SULFATE 2 GM/50ML IV SOLN
2.0000 g | Freq: Once | INTRAVENOUS | Status: AC
  Administered 2021-08-11: 11:00:00 2 g via INTRAVENOUS
  Filled 2021-08-11: qty 50

## 2021-08-11 MED ORDER — PROPOFOL 1000 MG/100ML IV EMUL
INTRAVENOUS | Status: DC | PRN
  Administered 2021-08-11: 5 ug via INTRAVENOUS

## 2021-08-11 MED ORDER — BUSPIRONE HCL 10 MG PO TABS
30.0000 mg | ORAL_TABLET | Freq: Three times a day (TID) | ORAL | Status: DC
  Administered 2021-08-12 – 2021-08-13 (×5): 30 mg
  Filled 2021-08-11 (×5): qty 3

## 2021-08-11 MED ORDER — SODIUM CHLORIDE 0.9 % IV SOLN
INTRAVENOUS | Status: AC | PRN
  Administered 2021-08-11: 100 mL/h via INTRAVENOUS

## 2021-08-11 MED ORDER — CHLORHEXIDINE GLUCONATE 0.12% ORAL RINSE (MEDLINE KIT)
15.0000 mL | Freq: Two times a day (BID) | OROMUCOSAL | Status: DC
  Administered 2021-08-11 – 2021-08-14 (×5): 15 mL via OROMUCOSAL

## 2021-08-11 MED ORDER — IOHEXOL 350 MG/ML SOLN
100.0000 mL | Freq: Once | INTRAVENOUS | Status: AC | PRN
  Administered 2021-08-11: 15:00:00 100 mL via INTRAVENOUS

## 2021-08-11 MED ORDER — LEVETIRACETAM IN NACL 1000 MG/100ML IV SOLN
1000.0000 mg | Freq: Two times a day (BID) | INTRAVENOUS | Status: DC
  Administered 2021-08-11 – 2021-08-14 (×5): 1000 mg via INTRAVENOUS
  Filled 2021-08-11 (×6): qty 100

## 2021-08-11 MED ORDER — ROCURONIUM BROMIDE 50 MG/5ML IV SOLN
INTRAVENOUS | Status: AC | PRN
  Administered 2021-08-11: 80 mg via INTRAVENOUS

## 2021-08-11 MED ORDER — FENTANYL BOLUS VIA INFUSION
25.0000 ug | INTRAVENOUS | Status: DC | PRN
Start: 2021-08-11 — End: 2021-08-13
  Filled 2021-08-11: qty 100

## 2021-08-11 MED ORDER — ACETAMINOPHEN 650 MG RE SUPP: 650.0000 mg | RECTAL | Status: DC

## 2021-08-11 MED ORDER — BUSPIRONE HCL 10 MG PO TABS: 30.0000 mg | ORAL_TABLET | Freq: Three times a day (TID) | ORAL | Status: DC

## 2021-08-11 NOTE — Progress Notes (Signed)
LTM maint complete - no skin breakdown [facility] monitored, Event button test confirmed by [facility]. Computer switched out, head box remained to same.

## 2021-08-11 NOTE — ED Provider Notes (Signed)
[facility] EMERGENCY DEPARTMENT Provider Note   CSN: [id number] Arrival date & time: 08/21/2021  0740     History Chief Complaint  Patient presents with   Cardiac Arrest    Jacob Ruiz is a 75 y.o. male.  This is a 75 y.o. male with significant medical history as below, including bronchitis, hld, prior MI who presents to the ED with complaint of cardiac arrest  Level 5 caveat, unresponsive  Per EMS pt was feeling fatigued/poor last 2-3 days. Walked outside this morning and was complaining of chest pain/dib. Collapsed in front of the wife, cyanotic, no seizure activity. No significant trauma. EMS was dispatched. Per EMS PEA on the monitor, given breathing treatments, intubated. Bradycardic en route and was paced externally which was d/c'd on arrival.    Jacob Ruiz, Jacob Ruiz     [phone]    Per spouse - pt had taken the dog for a walk, said he fell out in the yard. Unable to stand up straight/ unable to walk any further into the house, and started sliding down the wall in the home and fell down to the floor. Was able to speak at that time.  Frothing at the mouth at that time. Has been having URI symptoms around 1 wk, worsening difficulty with breathing.   The history is provided by the EMS personnel. No language interpreter was used.      Past Medical History:  Diagnosis Date   Chronic bronchitis ([place])    "get it just about q yr" (03/26/2014)   Coronary artery disease    H/O hiatal hernia    Hyperlipidemia    Myocardial infarct [facility]) 12/2020    Patient Active Problem List   Diagnosis Date Noted   Cardiac arrest [facility])    Acute respiratory failure with hypoxia and hypercapnia ([place])    AKI (acute kidney injury) ([place])    Shock liver    Suspected TIA (transient ischemic attack) 12/29/2020   NSTEMI (non-ST elevated myocardial infarction) ([place]) 12/29/2020   Ischemic chest pain ([place]) 11/07/2020   Chest pain 11/07/2020   Primary hypertension    Vertigo  03/26/2014   Bradycardia 03/26/2014   Tobacco abuse 03/26/2014   ERECTILE DYSFUNCTION, 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 CV LAB;  Service: Cardiovascular;  Laterality: N/A;   TONSILLECTOMY  05/26/1949       Family History  Problem Relation Age of Onset   Heart failure Mother    Heart disease Father     Social History   Tobacco Use   Smoking status: Every Day    Packs/day: 0.50    Years: 54.00    Pack years: 27.00    Types: Cigarettes   Smokeless tobacco: Never   Tobacco comments:    Patient given the phone number 1-800-quit-now. Patient has no desire to quit  Substance Use Topics   Alcohol use: No   Drug use: No    Home Medications Prior to  Admission medications   Medication Sig Start Date End Date Taking? Authorizing Provider  acetaminophen (TYLENOL) 500 MG tablet Take 1,000 mg by mouth every 6 (six) hours as needed for mild pain.   Yes [provider]  amLODipine (NORVASC) 10 MG tablet Take 1 tablet (10 mg total) by mouth daily. 03/08/21  Yes Elouise Munroe, MD  aspirin EC 81 MG tablet Take 81 mg by mouth daily. Swallow whole.   Yes [provider]  atorvastatin (LIPITOR) 40 MG tablet Take 1 tablet (40 mg total) by mouth daily. 03/07/21 10/01/21 Yes Kathyrn Lass, MD  cetirizine (ZYRTEC ALLERGY) 10 MG tablet Take 1 tablet (10 mg total) by mouth daily as needed for allergies  (itching). 04/24/21  Yes Petrucelli, Samantha R, PA-C  clopidogrel (PLAVIX) 75 MG tablet Take 1 tablet (75 mg total) by mouth daily. 05/12/21  Yes Elouise Munroe, MD  isosorbide mononitrate (IMDUR) 30 MG 24 hr tablet Take 1 tablet (30 mg total) by mouth daily. 03/11/21  Yes Elouise Munroe, MD  nitroGLYCERIN (NITROSTAT) 0.4 MG SL tablet Place 1 tablet (0.4 mg total) under the tongue every 5 (five) minutes as needed for chest pain. YOU MAY TAKE (1) TABLET EVERY 5 MINS AS NEEDED FOR CHEST PAIN. DO NOT EXCEED 3 DOSES. Patient taking differently: Place 0.4 mg under the tongue every 5 (five) minutes x 3 doses as needed for chest pain. 12/06/20 01/23/24 Yes Elouise Munroe, MD  predniSONE (DELTASONE) 10 MG tablet Take 40 mg (4 tablets) by mouth for day 1-5 then 20 mg (2 tablets) by mouth for day 6-10. Patient not taking: Reported on 05/27/2021 04/24/21   Petrucelli, Aldona Bar R, PA-C    Allergies    Lisinopril, Ace inhibitors, Nicotine, and Aleve [naproxen sodium]  Review of Systems   Review of Systems  Unable to perform ROS: Acuity of condition   Physical Exam Updated Vital Signs BP (!) 122/58   Pulse 64   Temp (!) 97 F (36.1 C)   Resp 19   Ht 5' 9.5" (1.765 m)   Wt 87.5 kg   SpO2 100%   BMI 28.08 kg/m   Physical Exam Vitals and nursing note reviewed. Exam conducted with a chaperone present.  Constitutional:      General: He is in acute distress.     Appearance: He is obese.  HENT:     Head: Normocephalic and atraumatic.     Right Ear: External ear normal.     Left Ear: External ear normal.     Nose: Nose normal.     Mouth/Throat:     Comments: Dried emesis  Eyes:     Comments: Pupils 34m, not reactive b/l  Cardiovascular:     Rate and Rhythm: Regular rhythm. Bradycardia present.     Pulses: Normal pulses.  Pulmonary:     Breath sounds: Decreased breath sounds present.     Comments: Mechanical breath sounds b/l; Musculoskeletal:     Right lower leg: No edema.     Left  lower leg: No edema.     Comments: IO left  tibia proximal  Neurological:     Mental Status: He is unresponsive.     GCS: GCS eye subscore is 1. GCS verbal subscore is 1. GCS motor subscore is 1.    ED Results / Procedures / Treatments   Labs (all labs ordered are listed, but only abnormal results are displayed) Labs Reviewed  COMPREHENSIVE METABOLIC PANEL - Abnormal; Notable for the following components:  Result Value   Sodium 134 (*)    Chloride 95 (*)    CO2 21 (*)    Glucose, Bld 284 (*)    Creatinine, Ser 1.29 (*)    Albumin 3.1 (*)    AST 207 (*)    ALT 133 (*)    GFR, Estimated 58 (*)    Anion gap 18 (*)    All other components within normal limits  BRAIN NATRIURETIC PEPTIDE - Abnormal; Notable for the following components:   B Natriuretic Peptide 219.6 (*)    All other components within normal limits  CBC - Abnormal; Notable for the following components:   RBC 4.12 (*)    Hemoglobin 12.7 (*)    MCV 100.7 (*)    All other components within normal limits  CBG MONITORING, ED - Abnormal; Notable for the following components:   Glucose-Capillary 254 (*)    All other components within normal limits  I-STAT ARTERIAL BLOOD GAS, ED - Abnormal; Notable for the following components:   pH, Arterial 7.064 (*)    pCO2 arterial 83.3 (*)    pO2, Arterial 70 (*)    Acid-base deficit 8.0 (*)    Sodium 132 (*)    All other components within normal limits  I-STAT ARTERIAL BLOOD GAS, ED - Abnormal; Notable for the following components:   pH, Arterial 7.217 (*)    pCO2 arterial 74.0 (*)    pO2, Arterial 352 (*)    Bicarbonate 30.4 (*)    TCO2 33 (*)    Sodium 134 (*)    All other components within normal limits  TROPONIN I (HIGH SENSITIVITY) - Abnormal; Notable for the following components:   Troponin I (High Sensitivity) 57 (*)    All other components within normal limits  TROPONIN I (HIGH SENSITIVITY) - Abnormal; Notable for the following components:   Troponin I (High  Sensitivity) 56 (*)    All other components within normal limits  RESP PANEL BY RT-PCR (FLU A&B, COVID) ARPGX2  CBC WITH DIFFERENTIAL/PLATELET  CK  CREATININE, SERUM  BLOOD GAS, ARTERIAL  BLOOD GAS, ARTERIAL  CBC  CREATININE, SERUM  TROPONIN I (HIGH SENSITIVITY)    EKG EKG Interpretation  Date/Time:  Thursday August 11 2021 07:59:09 EST Ventricular Rate:  88 PR Interval:  159 QRS Duration: 167 QT Interval:  402 QTC Calculation: 487 R Axis:   -68 Text Interpretation: Sinus rhythm RBBB and LAFB Confirmed by Wynona Dove ([id number]) on 08/06/2021 8:14:11 AM  Radiology DG Chest Portable 1 View  Result Date: 08/05/2021 CLINICAL DATA:  Post Arrest EXAM: PORTABLE CHEST 1 VIEW COMPARISON:  August 11, 2021. FINDINGS: Endotracheal tube tip at the level of the clavicular heads. Gastric tube courses below the diaphragm; however, the side port is above the GE junction. Mild diffuse interstitial prominence, likely mild interstitial edema. No visible pleural effusions or pneumothorax on this supine radiograph. Pulmonary vascular congestion with widened vascular pedicle. Cardiac silhouette is otherwise within normal limits. IMPRESSION: 1. Probable mild interstitial pulmonary edema and pulmonary vascular congestion. 2. Endotracheal tube tip at the level of the clavicular heads. 3. Gastric tube courses below the diaphragm; however, the side port is above the GE junction. If intragastric side port positioning is desired, recommend advancement Electronically Signed   By: Margaretha Sheffield M.D.   On: 08/06/2021 08:34   DG Chest Portable 1 View  Result Date: 08/22/2021 CLINICAL DATA:  Post Arrest EXAM: PORTABLE CHEST 1 VIEW COMPARISON:  October 25 FINDINGS: Endotracheal tube terminates in  the midthoracic trachea approximately 4.5 cm above the level of the carina. There is an enteric tube which courses below the level of the diaphragm with the distal tip not visualized. The heart is normal in size. No  pericardial effusion, although the left costophrenic angle is excluded from the field of view. The no pneumothorax. There is increased perihilar vascular prominence and interstitial opacities. No lobar consolidation. No displaced rib fractures appreciated. IMPRESSION: Support lines and tubes as described. The endotracheal tube terminates approximately 4.5 cm above the level of the carina. Increased vascular prominence and interstitial opacities compatible with vascular congestion/edema. Electronically Signed   By: Albin Felling M.D.   On: 08/23/2021 08:25   EEG adult  Result Date: 08/03/2021 Lora Havens, MD     08/23/2021 12:44 PM Patient Name: Jacob Ruiz MRN: [id number] Epilepsy Attending: Lora Havens Referring Physician/Provider: Dr Jacky Kindle Date: 08/06/2021 Duration: 22.42 mins Patient history: 86 old male status post cardiac arrest.  EEG to evaluate for seizure. Level of alertness: comatose AEDs during EEG study: Propofol Technical aspects: This EEG study was done with scalp electrodes positioned according to the 10-20 International system of electrode placement. Electrical activity was acquired at a sampling rate of _0  and reviewed with a high frequency filter of _1  and a low frequency filter of _2 . EEG data were recorded continuously and digitally stored. Description: EEG showed burst suppression pattern with bursts of high amplitude generalized polyspike and wave lasting 2 to 3 seconds alternating with 5 to 15 seconds of generalized EEG suppression.  During burst patient was also noted to have bilateral shoulder jerk consistent with myoclonic seizure. EEG was not reactive to tactile stimulation. Hyperventilation and photic stimulation were not performed.   ABNORMALITY -Myoclonic seizure, generalized -Burst suppression with highly epileptiform bursts, generalized IMPRESSION: This study showed myoclonic seizures every 5 to 15 seconds with generalized onset.  Additionally there is  profound diffuse encephalopathy.  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 Dr Tacy Learn was notified. Lora Havens    Procedures Procedure Name: Intubation Date/Time: 08/18/2021 8:11 AM Performed by: Jeanell Sparrow, DO Pre-anesthesia Checklist: Emergency Drugs available, Patient being monitored, Patient identified, Suction available and Timeout performed Preoxygenation: Pre-oxygenation with 100% oxygen Induction Type: Rapid sequence Laryngoscope Size: Mac and 4 Grade View: Grade II Tube size: 8.0 mm Number of attempts: 1 Intubation method: video assisted. Placement Confirmation: ETT inserted through vocal cords under direct vision, Positive ETCO2, CO2 detector and Breath sounds checked- equal and bilateral Secured at: 25 cm Tube secured with: ETT holder Dental Injury: Teeth and Oropharynx as per pre-operative assessment  Difficulty Due To: Difficulty was anticipated, Difficult Airway- due to large tongue, Difficult Airway- due to reduced neck mobility, Difficult Airway- due to limited oral opening and Difficult Airway- due to dentition    .Critical Care Performed by: Jeanell Sparrow, DO Authorized by: Jeanell Sparrow, DO   Critical care provider statement:    Critical care time (minutes):  42   Critical care was necessary to treat or prevent imminent or life-threatening deterioration of the following conditions:  Cardiac failure and respiratory failure   Critical care was time spent personally by me on the following activities:  Development of treatment plan with patient or surrogate, discussions with consultants, evaluation of patient's response to treatment, examination of patient, ordering and review of laboratory studies, ordering and review of radiographic studies, ordering and performing treatments and interventions, pulse oximetry, re-evaluation of  patient's condition and review of old [place] discussed with: admitting provider      Medications Ordered in ED Medications  etomidate (AMIDATE) injection (15 mg Intravenous Given 08/12/2021 0748)  rocuronium (ZEMURON) injection (80 mg Intravenous Given 07/30/2021 0749)  0.9 %  sodium chloride infusion (100 mL/hr Intravenous New Bag/Given 08/23/2021 0756)  propofol (DIPRIVAN) 1000 MG/100ML infusion (0 mcg/kg/min  87.5 kg Intravenous Paused 08/02/2021 0838)  midazolam (VERSED) injection 1 mg (has no administration in time range)  midazolam (VERSED) injection 1 mg (has no administration in time range)  docusate (COLACE) 50 MG/5ML liquid 100 mg (0 mg Per Tube Hold 07/31/2021 1113)  polyethylene glycol (MIRALAX / GLYCOLAX) packet 17 g (0 g Per Tube Hold 08/07/2021 1113)  ondansetron (ZOFRAN) injection 4 mg (has no administration in time range)  acetaminophen (TYLENOL) tablet 650 mg (0 mg Oral Hold 08/07/2021 0956)    Or  acetaminophen (TYLENOL) 160 MG/5ML solution 650 mg ( Per Tube See Alternative 08/18/2021 0956)    Or  acetaminophen (TYLENOL) suppository 650 mg ( Rectal See Alternative 08/03/2021 0956)  busPIRone (BUSPAR) tablet 30 mg (0 mg Oral Hold 08/13/2021 1109)    Or  busPIRone (BUSPAR) tablet 30 mg ( Per Tube See Alternative 07/26/2021 1109)  norepinephrine (LEVOPHED) 44m in 2556mpremix infusion (3 mcg/min Intravenous Rate/Dose Change 08/20/2021 1212)  0.9 %  sodium chloride infusion (250 mLs Intravenous New Bag/Given 08/03/2021 1115)  pantoprazole (PROTONIX) injection 40 mg (has no administration in time range)  fentaNYL (SUBLIMAZE) injection 25 mcg (0 mcg Intravenous Hold 07/30/2021 1123)  fentaNYL 250026min NS 250m21m0mc55m) infusion-PREMIX (25 mcg/hr Intravenous New Bag/Given 08/16/2021 1119)  fentaNYL (SUBLIMAZE) bolus via infusion 25-100 mcg (has no administration in time range)  polyethylene glycol (MIRALAX / GLYCOLAX) packet 17 g (has no administration in time range)  heparin injection 5,000 Units (has no administration in time range)  docusate (COLACE) 50 MG/5ML liquid 100 mg (has  no administration in time range)  perflutren lipid microspheres (DEFINITY) IV suspension (5 mLs Intravenous Given 08/24/2021 1303)  magnesium sulfate IVPB 2 g 50 mL (0 g Intravenous Stopped 07/26/2021 1213)    ED Course  I have reviewed the triage vital signs and the nursing notes.  Pertinent labs & imaging results that were available during my care of the patient were reviewed by me and considered in my medical decision making (see chart for details).    MDM Rules/Calculators/A&P                           CC: cardiac arrest  This patient presents with cardiac arrest; this involves an extensive number of treatment options and is a complaint that carries with it a high risk of complications and morbidity. Vital signs were reviewed. Serious etiologies considered.  Record review:  Previous records obtained and reviewed   Additional history obtained from family  Work up as above, notable for:  Labs & imaging results that were available during my care of the patient were reviewed by me and considered in my medical decision making.  He has mild troponin leak. EKG without STEMI. Elevated AST/ALT, concern for hypoperfusion/shock liver. He also has AKI   I ordered imaging studies which included CXR and I independently visualized and interpreted imaging which showed ETT in place, tube was adjusted and rpt cxr obtained.   Management: IVF, mechanical ventilation  Reassessment:  Pt initially hemodynamically stable but becoming hypotensive so diprivan infusion was stopped,  start fentanyl infusion. Will start peripheral levo to ensure proper sedation/analgesia post intubation.   D/w family regarding presentation and plan for today.   D/w PCCM regarding pt, they will admit pt to their service.            This chart was dictated using voice recognition software.  Despite best efforts to proofread,  errors can occur which can change the documentation meaning.  Final Clinical  Impression(s) / ED Diagnoses Final diagnoses:  Cardiac arrest ([place])  Acute respiratory failure, unspecified whether with hypoxia or hypercapnia ([place])  Shock liver  AKI (acute kidney injury) ([place])  NSTEMI (non-ST elevated myocardial infarction) [facility])    Rx / DC Orders ED Discharge Orders     None        Jeanell Sparrow, DO 08/16/2021 1331

## 2021-08-11 NOTE — ED Notes (Signed)
Report attempted 

## 2021-08-11 NOTE — Progress Notes (Signed)
   2021/09/10 1000  Clinical Encounter Type  Visited With Health care provider;Patient not available  Visit Type Initial;ED  Referral From Social work    [facility] responded to [facility] consult from ED CSW for assistance w/advance directives for pt.  Pt. intubated in ED trauma bay when [facility] arrived; pt. will need to be awake, alert, and oriented before he can complete advance directive; info shared w/CSW.  Chaplains remain available via consult and page for further assistance if/when pt. is appropriate to discuss AD and if other support needs arise.  Elpidio Anis, Chaplain Pager: [phone]

## 2021-08-11 NOTE — H&P (Signed)
NAME:  Jacob Ruiz, MRN:  [id number], DOB:  08-26-1946, LOS: 0 ADMISSION DATE:  08/19/2021, CONSULTATION DATE: 08/06/2021 REFERRING MD: Pearline Cables, CHIEF COMPLAINT: Cardiac arrest  Brief History   75 year old male with chronic bronchitis, hypertension and CAD status post NSTEMI treated with PCI x2 to Lcx in February 2022 who was brought to the ED via EMS status postcardiac arrest.  PCCM was consulted for ongoing management.  History of present illness   History is obtained from his wife and from chart review.  Patient is intubated and sedated and is unable to participate in history taking. His wife notes that he was in his usual state of health this morning and went out to walk the dog.  He fell on his way back home and called his wife.  He had difficulties standing back up so his wife went and helped him.  On the way back to the house, he had told her that he is having a hard time standing straight but did not complain of chest pain.   Once they had returned to the house, he collapsed.  She was not able to feel a pulse and immediately started CPR and called EMS.  She notes that he has been complaining of worsening dyspnea with even minimal exertion over the past month as well as increased cough with sputum production.  He has been occasionally waking up at night saying that he cannot breathe.  He has been sleeping on 1 pillow which is usual for him. She denies recent fever, chills, or exertional chest pain. She tells me that he was pretty healthy prior to his NSTEMI in February of this year.  She notes that he has been compliant with his Plavix and aspirin and has not missed any recent doses.  Past Medical History  CAD status post NSTEMI status post DES x2 (February 2022), hypertension, hyperlipidemia, chronic bronchitis, tobacco use disorder  Significant Hospital Events   11/17: Admitted status postcardiac arrest, intubated  Significant Diagnostic Tests:  11/17 chest x-ray: Bilateral  interstitial opacities consistent with pulmonary edema  Interim history/subjective:  As noted above  Objective   Blood pressure 102/63, pulse 63, temperature (!) 97 F (36.1 C), resp. rate 20, height 5' 9.5" (1.765 m), weight 87.5 kg, SpO2 100 %.    Vent Mode: PRVC FiO2 (%):  [100 %] 100 % Set Rate:  [32 bmp] 32 bmp Vt Set:  [570 mL] 570 mL PEEP:  [8 cmH20] 8 cmH20 Plateau Pressure:  [24 cmH20] 24 cmH20   Intake/Output Summary (Last 24 hours) at 08/13/2021 0933 Last data filed at 08/07/2021 0800 Gross per 24 hour  Intake 0.25 ml  Output --  Net 0.25 ml   Filed Weights   08/03/2021 0800  Weight: 87.5 kg    Examination: General: Critically ill-appearing gentleman intubated HEENT: Head atraumatic, nares clear, no scleral icterus or conjunctival injection Cardiac: Bradycardic rate, regular rhythm.  No lower extremity edema.  Extremities cool Pulm: Mechanical breath sounds bilaterally, bibasilar crackles GI: Abdomen is soft, nondistended Neuro: Sedated on propofol, pupils are miotic bilaterally. MSK: Normal muscle bulk and tone, no fasciculations or tremor Skin: skin tears on the hands bilaterally, no rash  Resolved Hospital Problem list     Assessment & Plan:   Cardiac arrest Multivessel CAD, Hx of NSTEMI s/p PCI x2 to Lcx Acute hypoxic and hypercapnic respiratory failure requiring mechanical ventilation due to above Suspect recurrent MI.  Point-of-care ultrasound shows RV dilation and in no IVC variability with respirations.  He is noted to become hypotensive after increasing fluids which improved after fluid discontinuation.  Chest x-ray significant for pulmonary edema Plan Admit to ICU Trend troponins Continue Plavix and aspirin, statin CBC, CMP, CK, BNP Echocardiogram Hemodynamically stable now after stopping fluids.  Recommend Levophed if needed for pressure support. Cardiology consult pending initial work-up Hold home antihypertensives Vent bundle Propofol for  sedation  COPD, tobacco use disorder Encourage smoking cessation  Best practice:  Diet: N.p.o. Pain/Anxiety/Delirium protocol (if indicated): Propofol DVT prophylaxis: Heparin GI prophylaxis: Protonix Code Status: Full Family Communication: Wife present at bedside Disposition: ICU  Labs   CBC: Recent Labs  Lab 08/13/21 0815  WBC 9.2  NEUTROABS PENDING  HGB 13.8  HCT 44.3  MCV 99.6  PLT 244    Basic Metabolic Panel: No results for input(s): NA, K, CL, CO2, GLUCOSE, BUN, CREATININE, CALCIUM, MG, PHOS in the last 168 hours. GFR: CrCl cannot be calculated (Patient's most recent lab result is older than the maximum 21 days allowed.). Recent Labs  Lab 08-13-21 0815  WBC 9.2    Liver Function Tests: No results for input(s): AST, ALT, ALKPHOS, BILITOT, PROT, ALBUMIN in the last 168 hours. No results for input(s): LIPASE, AMYLASE in the last 168 hours. No results for input(s): AMMONIA in the last 168 hours.  ABG    Component Value Date/Time   TCO2 26 12/29/2020 1825     Coagulation Profile: No results for input(s): INR, PROTIME in the last 168 hours.  Cardiac Enzymes: No results for input(s): CKTOTAL, CKMB, CKMBINDEX, TROPONINI in the last 168 hours.  HbA1C: Hgb A1c MFr Bld  Date/Time Value Ref Range Status  12/30/2020 10:35 AM 6.1 (H) 4.8 - 5.6 % Final    Comment:    (NOTE) Pre diabetes:          5.7%-6.4%  Diabetes:              >6.4%  Glycemic control for   <7.0% adults with diabetes     CBG: No results for input(s): GLUCAP in the last 168 hours.  Review of Systems:   Unable to be obtained due to critical illness requiring mechanical ventilation  Past Medical History  He,  has a past medical history of Chronic bronchitis (HCC), Coronary artery disease, H/O hiatal hernia, Hyperlipidemia, and Myocardial infarct (HCC) (12/2020).   Surgical History    Past Surgical History:  Procedure Laterality Date   CARDIAC CATHETERIZATION     CORONARY STENT  INTERVENTION N/A 12/31/2020   Procedure: CORONARY STENT INTERVENTION;  Surgeon: Yvonne Kendall, MD;  Location: [facility];  Service: Cardiovascular;  Laterality: N/A;   KNEE ARTHROSCOPY Right 05/26/1969   LEFT HEART CATH AND CORONARY ANGIOGRAPHY N/A 12/31/2020   Procedure: LEFT HEART CATH AND CORONARY ANGIOGRAPHY;  Surgeon: Yvonne Kendall, MD;  Location: [facility];  Service: Cardiovascular;  Laterality: N/A;   TONSILLECTOMY  05/26/1949     Social History   reports that he has been smoking cigarettes. He has a 27.00 pack-year smoking history. He has never used smokeless tobacco. He reports that he does not drink alcohol and does not use drugs.   Family History   His family history includes Heart disease in his father; Heart failure in his mother.   Allergies Allergies  Allergen Reactions   Lisinopril Anaphylaxis   Ace Inhibitors     unknown   Aleve [Naproxen Sodium] Rash     Home Medications  Prior to Admission medications   Medication Sig  Start Date End Date Taking? Authorizing Provider  amLODipine (NORVASC) 10 MG tablet Take 1 tablet (10 mg total) by mouth daily. 03/08/21   Elouise Munroe, MD  aspirin EC 81 MG tablet Take 81 mg by mouth daily. Swallow whole.    [provider]  atorvastatin (LIPITOR) 40 MG tablet Take 1 tablet (40 mg total) by mouth daily. 03/07/21 05/27/21  Kathyrn Lass, MD  cetirizine (ZYRTEC ALLERGY) 10 MG tablet Take 1 tablet (10 mg total) by mouth daily as needed for allergies (itching). 04/24/21   Petrucelli, Glynda Jaeger, PA-C  clopidogrel (PLAVIX) 75 MG tablet Take 1 tablet (75 mg total) by mouth daily. 05/12/21   Elouise Munroe, MD  isosorbide mononitrate (IMDUR) 30 MG 24 hr tablet Take 1 tablet (30 mg total) by mouth daily. 03/11/21   Elouise Munroe, MD  nicotine (NICODERM CQ - DOSED IN MG/24 HOURS) 21 mg/24hr patch Place 1 patch (21 mg total) onto the skin daily. Patient not taking: Reported on 05/27/2021 11/10/20   Geradine Girt, DO  nitroGLYCERIN (NITROSTAT) 0.4 MG SL tablet Place 1 tablet (0.4 mg total) under the tongue every 5 (five) minutes as needed for chest pain. YOU MAY TAKE (1) TABLET EVERY 5 MINS AS NEEDED FOR CHEST PAIN. DO NOT EXCEED 3 DOSES. 12/06/20 01/23/24  Elouise Munroe, MD  predniSONE (DELTASONE) 10 MG tablet Take 40 mg (4 tablets) by mouth for day 1-5 then 20 mg (2 tablets) by mouth for day 6-10. Patient not taking: Reported on 05/27/2021 04/24/21   Petrucelli, Glynda Jaeger, PA-C     Mitzi Hansen, MD Internal Medicine Resident PGY-3 [facility] 08/24/2021 10:09 AM

## 2021-08-11 NOTE — Plan of Care (Signed)
Notified of myoclonic seizures occurring on LTM. Pt re-evaluated. Exam consistent with generalized myoclonic jerks.  Primary concern is anoxic injury Neurology consulted Stat head CT  Of additional note: repeat ABG shows improvement in pH from 7.0>7.2. no significant change in CO2. Discussed with Dr. Merrily Pew, will decrease rate to 30, increase TV  Elige Radon, MD Internal Medicine Resident PGY-3 [facility] 07/31/2021 1:35 PM

## 2021-08-11 NOTE — ED Notes (Signed)
Patient transported to CT 

## 2021-08-11 NOTE — Code Documentation (Signed)
Pt necklace placed in berlongings bag

## 2021-08-11 NOTE — Discharge Planning (Signed)
Transitions of Care team consulted regarding Advanced Directives and POA.  RNCM consulted Spiritual Care to assist with this matter.

## 2021-08-11 NOTE — Progress Notes (Signed)
Started cEEG study.  Tested patient event button.  °

## 2021-08-11 NOTE — Consult Note (Signed)
Neurology Consultation Reason for Consult: Myoclonus Referring Physician: Tacy Learn, S  CC: Myoclonus  History is obtained from: Chart review  HPI: Jacob Ruiz is a 75 y.o. male with a history of hyperlipidemia, heart disease who collapsed after coming back in for walking the dog.  His wife performed bedside CPR and called 911.  He was found to have PEA arrest.  Unfortunately, he developed myoclonus early after ROSC and therefore neurology has been consulted.  An EMG was obtained which demonstrates a dismal pattern of burst suppression with only epileptiform bursts associated with generalized myoclonus.     ROS:  Unable to obtain due to altered mental status.   Past Medical History:  Diagnosis Date   Chronic bronchitis ([place])    "get it just about q yr" (03/26/2014)   Coronary artery disease    H/O hiatal hernia    Hyperlipidemia    Myocardial infarct [facility]) 12/2020     Family History  Problem Relation Age of Onset   Heart failure Mother    Heart disease Father      Social History:  reports that he has been smoking cigarettes. He has a 27.00 pack-year smoking history. He has never used smokeless tobacco. He reports that he does not drink alcohol and does not use drugs.   Exam: Current vital signs: BP 121/67   Pulse (!) 55   Temp (!) 97 F (36.1 C) (Temporal)   Resp (!) 24   Ht 5' 9.5" (1.765 m)   Wt 87.5 kg   SpO2 99%   BMI 28.08 kg/m  Vital signs in last 24 hours: Temp:  [97 F (36.1 C)] 97 F (36.1 C) (11/17 1715) Pulse Rate:  [46-88] 55 (11/17 1700) Resp:  [7-27] 24 (11/17 1700) BP: (84-167)/(39-108) 121/67 (11/17 1700) SpO2:  [90 %-100 %] 99 % (11/17 1700) FiO2 (%):  [60 %-100 %] 60 % (11/17 1545) Weight:  [87.5 kg] 87.5 kg (11/17 0800)   Physical Exam  Constitutional: Appears well-developed and well-nourished.  Psych: Does not respond Eyes: No scleral injection HENT: ET tube in place MSK: no joint deformities.  Cardiovascular: Normal rate and  regular rhythm.  Respiratory: Effort normal, non-labored breathing GI: Soft.  No distension. There is no tenderness.  Skin: WDI  Neuro: Mental Status: Patient is unresponsive, only response to noxious stimulation and is precipitation of myoclonus Cranial Nerves: II: Pupils are slightly unequal with the right larger than left, both are reactive III,IV, VI: No response to doll's eye maneuver VVII: Corneals are present Motor: No response to noxious stimulation other than myoclonus precipitation  sensory: As above  Cerebellar: Does not perform     I have reviewed labs in epic and the results pertinent to this consultation are: Sodium 134 Creatinine 1.29  I have reviewed the images obtained: CT head-read as normal, I question some early subcortical blurring  Impression: 75 year old male with almost certainly dismal prognosis.  If no improvement by tomorrow, I think we would have enough information to recommend withdrawal of care to the family.  I we will also repeat CT in the morning.  Propofol can help suppress these movements, though it has not been shown to improve prognosis.  Recommendations: 1) continue LTM EEG for now 2) Keppra 1 g twice daily 3) could use propofol to suppress the movements if causing family discomfort 4) repeat head CT in the morning 5) continue family discussions.  This patient is critically ill and at significant risk of neurological worsening, death and care  requires constant monitoring of vital signs, hemodynamics,respiratory and cardiac monitoring, neurological assessment, discussion with family, other specialists and medical decision making of high complexity. I spent 40 minutes of neurocritical care time  in the care of  this patient. This was time spent independent of any time provided by nurse practitioner or PA.  Ritta Slot, MD [facility] [phone]  If 7pm- 7am, please page neurology on call as listed in  AMION. 08/10/2021  8:47 PM

## 2021-08-11 NOTE — ED Notes (Signed)
Critical Care MD made aware of pt being hypotensive. Informed to bolus fluids

## 2021-08-11 NOTE — Progress Notes (Signed)
Patient moving to ICU room - EEG will be hooked up once patient is there.

## 2021-08-11 NOTE — Progress Notes (Signed)
Patient transported to [facility] from ED without complications. RN at bedside.

## 2021-08-11 NOTE — Code Documentation (Addendum)
Pt arrived via [facility] with c/c of post arrest. Per EMS pt was witness arrest by wife and started CPR. Pt has hx of STEMi in spring. Upon arrival of EMS pt was brady where they begin to pace pt   4 epi, 2g mag, 125 solu-med, 5 alb

## 2021-08-11 NOTE — Progress Notes (Signed)
Patient transported to CT & back to ED on the ventilator with no problems.

## 2021-08-11 NOTE — Progress Notes (Signed)
EEG complete - results pending 

## 2021-08-11 NOTE — Procedures (Signed)
Patient Name: Jacob Ruiz  MRN: [id number]  Epilepsy Attending: Charlsie Quest  Referring Physician/Provider: Dr Cheri Fowler Date: 08/18/2021  Duration: 22.42 mins  Patient history: 56 old male status post cardiac arrest.  EEG to evaluate for seizure.  Level of alertness: comatose  AEDs during EEG study: Propofol   Technical aspects: This EEG study was done with scalp electrodes positioned according to the 10-20 International system of electrode placement. Electrical activity was acquired at a sampling rate of 500Hz  and reviewed with a high frequency filter of 70Hz  and a low frequency filter of 1Hz . EEG data were recorded continuously and digitally stored.   Description: EEG showed burst suppression pattern with bursts of high amplitude generalized polyspike and wave lasting 2 to 3 seconds alternating with 5 to 15 seconds of generalized EEG suppression.  During burst patient was also noted to have bilateral shoulder jerk consistent with myoclonic seizure. EEG was not reactive to tactile stimulation. Hyperventilation and photic stimulation were not performed.     ABNORMALITY -Myoclonic seizure, generalized -Burst suppression with highly epileptiform bursts, generalized  IMPRESSION: This study showed myoclonic seizures every 5 to 15 seconds with generalized onset.  Additionally there is profound diffuse encephalopathy.  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  Dr was notified.  Jacob Ruiz 

## 2021-08-12 ENCOUNTER — Inpatient Hospital Stay (HOSPITAL_COMMUNITY): Payer: Medicare Other

## 2021-08-12 DIAGNOSIS — R569 Unspecified convulsions: Secondary | ICD-10-CM | POA: Diagnosis not present

## 2021-08-12 DIAGNOSIS — I469 Cardiac arrest, cause unspecified: Secondary | ICD-10-CM | POA: Diagnosis not present

## 2021-08-12 LAB — CBC
HCT: 38.9 % — ABNORMAL LOW (ref 39.0–52.0)
Hemoglobin: 13.3 g/dL (ref 13.0–17.0)
MCH: 30.9 pg (ref 26.0–34.0)
MCHC: 34.2 g/dL (ref 30.0–36.0)
MCV: 90.5 fL (ref 80.0–100.0)
Platelets: 231 10*3/uL (ref 150–400)
RBC: 4.3 MIL/uL (ref 4.22–5.81)
RDW: 12.8 % (ref 11.5–15.5)
WBC: 12.9 10*3/uL — ABNORMAL HIGH (ref 4.0–10.5)
nRBC: 0 % (ref 0.0–0.2)

## 2021-08-12 LAB — TRIGLYCERIDES: Triglycerides: 75 mg/dL (ref <150)

## 2021-08-12 LAB — HEMOGLOBIN A1C
Hgb A1c MFr Bld: 6.3 % — ABNORMAL HIGH (ref 4.8–5.6)
Mean Plasma Glucose: 134.11 mg/dL

## 2021-08-12 LAB — BASIC METABOLIC PANEL
Anion gap: 12 (ref 5–15)
BUN: 35 mg/dL — ABNORMAL HIGH (ref 8–23)
CO2: 26 mmol/L (ref 22–32)
Calcium: 8.9 mg/dL (ref 8.9–10.3)
Chloride: 96 mmol/L — ABNORMAL LOW (ref 98–111)
Creatinine, Ser: 2.13 mg/dL — ABNORMAL HIGH (ref 0.61–1.24)
GFR, Estimated: 32 mL/min — ABNORMAL LOW (ref >60)
Glucose, Bld: 151 mg/dL — ABNORMAL HIGH (ref 70–99)
Potassium: 4.3 mmol/L (ref 3.5–5.1)
Sodium: 134 mmol/L — ABNORMAL LOW (ref 135–145)

## 2021-08-12 LAB — GLUCOSE, CAPILLARY
Glucose-Capillary: 161 mg/dL — ABNORMAL HIGH (ref 70–99)
Glucose-Capillary: 151 mg/dL — ABNORMAL HIGH (ref 70–99)
Glucose-Capillary: 130 mg/dL — ABNORMAL HIGH (ref 70–99)

## 2021-08-12 LAB — MAGNESIUM
Magnesium: 3.1 mg/dL — ABNORMAL HIGH (ref 1.7–2.4)
Magnesium: 3.3 mg/dL — ABNORMAL HIGH (ref 1.7–2.4)

## 2021-08-12 LAB — PHOSPHORUS
Phosphorus: 5.2 mg/dL — ABNORMAL HIGH (ref 2.5–4.6)
Phosphorus: 6.1 mg/dL — ABNORMAL HIGH (ref 2.5–4.6)

## 2021-08-12 MED ORDER — CHLORHEXIDINE GLUCONATE 0.12 % MT SOLN
OROMUCOSAL | Status: AC
  Administered 2021-08-12: 15 mL via OROMUCOSAL
  Filled 2021-08-12: qty 15

## 2021-08-12 MED ORDER — AMIODARONE IV BOLUS ONLY 150 MG/100ML
150.0000 mg | Freq: Once | INTRAVENOUS | Status: AC
  Administered 2021-08-12: 150 mg via INTRAVENOUS

## 2021-08-12 MED ORDER — AMIODARONE HCL IN DEXTROSE 360-4.14 MG/200ML-% IV SOLN
INTRAVENOUS | Status: AC
  Filled 2021-08-12: qty 200

## 2021-08-12 MED ORDER — METOPROLOL TARTRATE 5 MG/5ML IV SOLN
5.0000 mg | Freq: Once | INTRAVENOUS | Status: AC
Start: 2021-08-12 — End: 2021-08-12
  Administered 2021-08-12: 5 mg via INTRAVENOUS
  Filled 2021-08-12: qty 5

## 2021-08-12 MED ORDER — PROSOURCE TF PO LIQD
45.0000 mL | Freq: Three times a day (TID) | ORAL | Status: DC
  Administered 2021-08-12 – 2021-08-14 (×6): 45 mL
  Filled 2021-08-12 (×6): qty 45

## 2021-08-12 MED ORDER — CLOPIDOGREL BISULFATE 75 MG PO TABS
75.0000 mg | ORAL_TABLET | Freq: Every day | ORAL | Status: DC
  Administered 2021-08-12 – 2021-08-14 (×3): 75 mg
  Filled 2021-08-12 (×3): qty 1

## 2021-08-12 MED ORDER — AMIODARONE HCL IN DEXTROSE 360-4.14 MG/200ML-% IV SOLN
30.0000 mg/h | INTRAVENOUS | Status: DC
  Administered 2021-08-13: 30 mg/h via INTRAVENOUS
  Filled 2021-08-12 (×2): qty 200

## 2021-08-12 MED ORDER — AMIODARONE HCL IN DEXTROSE 360-4.14 MG/200ML-% IV SOLN
60.0000 mg/h | INTRAVENOUS | Status: DC
  Administered 2021-08-12 (×2): 60 mg/h via INTRAVENOUS

## 2021-08-12 MED ORDER — ASPIRIN 81 MG PO CHEW
81.0000 mg | CHEWABLE_TABLET | Freq: Every day | ORAL | Status: DC
  Administered 2021-08-12 – 2021-08-14 (×3): 81 mg
  Filled 2021-08-12 (×3): qty 1

## 2021-08-12 MED ORDER — INSULIN ASPART 100 UNIT/ML IJ SOLN
0.0000 [IU] | INTRAMUSCULAR | Status: DC
  Administered 2021-08-12 (×3): 2 [IU] via SUBCUTANEOUS
  Administered 2021-08-13: 3 [IU] via SUBCUTANEOUS
  Administered 2021-08-13 (×4): 1 [IU] via SUBCUTANEOUS
  Administered 2021-08-14 (×3): 2 [IU] via SUBCUTANEOUS

## 2021-08-12 MED ORDER — VITAL 1.5 CAL PO LIQD
1000.0000 mL | ORAL | Status: DC
  Administered 2021-08-12 – 2021-08-14 (×2): 1000 mL

## 2021-08-12 NOTE — Progress Notes (Signed)
Subjective: No significant changes, EEG with persistent epileptiform burst, but no associated movements  Exam: Vitals:   08/12/21 1116 08/12/21 1200  BP: 115/64   Pulse: (!) 59   Resp: (!) 30   Temp:  99 F (37.2 C)  SpO2: 94%    Gen: In bed, NAD Resp: non-labored breathing, no acute distress Abd: soft, nt  Neuro: MS: Does not open eyes or follow commands. 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  Based on early myoclonus and EEG findings I strongly suspect that he has a poor prognosis.  With normal CT, I would favor giving him another 24 hours, with MRI in the morning.  Recommendations: 1) MRI tomorrow 2) will continue EEG through tomorrow morning 3) neurology will continue to follow  This patient is critically ill and at significant risk of neurological worsening, death and care requires constant monitoring of vital signs, hemodynamics,respiratory and cardiac monitoring, neurological assessment, discussion with family, other specialists and medical decision making of high complexity. I spent 39 minutes of neurocritical care time  in the care of  this patient. This was time spent independent of any time provided by nurse practitioner or PA.  Ritta Slot, MD [facility] [phone]  If 7pm- 7am, please page neurology on call as listed in AMION. 08/12/2021  1:20 PM

## 2021-08-12 NOTE — Progress Notes (Signed)
Critical care attending attestation note:  Patient seen and examined and relevant ancillary tests reviewed.  I agree with the assessment and plan of care as outlined by Kandice Robinsons, NP.   Synopsis of assessment and plan:  74 year old man who is critically ill following PEA cardiac arrest likely related to hypoxia from decompensated heart failure requiring mechanical ventilation.  Patient is off vasopressors.  He had early myoclonus which has subsequently subsided.   Continuous EEG personally reviewed and appears isoelectric.  On examination no response to painful stimuli. Episode of atrial fibrillation with rapid ventricular response following patient turned.  Now started on amiodarone.  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 -Prognosis is likely poor.  Neurology following.  Plan for EEG postarrest day 3 for to aid in neuro prognostication. -Family has been advised of likely poor prognosis.  Will need to further address CODE STATUS.Marland Kitchen   CRITICAL CARE Performed by: Lynnell Catalan   Total critical care time: 35 minutes  Critical care time was exclusive of separately billable procedures and treating other patients.  Critical care was necessary to treat or prevent imminent or life-threatening deterioration.  Critical care was time spent personally by me on the following activities: development of treatment plan with patient and/or surrogate as well as nursing, discussions with consultants, evaluation of patient's response to treatment, examination of patient, obtaining history from patient or surrogate, ordering and performing treatments and interventions, ordering and review of laboratory studies, ordering and review of radiographic studies, pulse oximetry, re-evaluation of patient's condition and participation in multidisciplinary rounds.  Lynnell Catalan, MD [facility] ICU Physician [facility]  Pager:  [phone] Mobile: [phone] After hours: [phone].  08/12/2021, 1:49 PM

## 2021-08-12 NOTE — Progress Notes (Addendum)
NAME:  Jacob Ruiz, MRN:  [id number], DOB:  08-Sep-1946, LOS: 1 ADMISSION DATE:  09-09-2021, CONSULTATION DATE: 09-09-21 REFERRING MD: Wallace Cullens, CHIEF COMPLAINT: Cardiac arrest  Brief History   75 year old male with chronic bronchitis, hypertension and CAD status post NSTEMI treated with PCI x2 to Lcx in February 2022 who was brought to the ED via EMS status postcardiac arrest.  PCCM was consulted for ongoing management.  History of present illness   History is obtained from his wife and from chart review.  Patient is intubated and sedated and is unable to participate in history taking. His wife notes that he was in his usual state of health this morning and went out to walk the dog.  He fell on his way back home and called his wife.  He had difficulties standing back up so his wife went and helped him.  On the way back to the house, he had told her that he is having a hard time standing straight but did not complain of chest pain.   Once they had returned to the house, he collapsed.  She was not able to feel a pulse and immediately started CPR and called EMS.  She notes that he has been complaining of worsening dyspnea with even minimal exertion over the past month as well as increased cough with sputum production.  He has been occasionally waking up at night saying that he cannot breathe.  He has been sleeping on 1 pillow which is usual for him.  She denies recent fever, chills, or exertional chest pain. She tells me that he was pretty healthy prior to his NSTEMI in February of this year.  She notes that he has been compliant with his Plavix and aspirin and has not missed any recent doses.  Past Medical History  CAD status post NSTEMI status post DES x2 (February 2022), hypertension, hyperlipidemia, chronic bronchitis, tobacco use disorder  Significant Hospital Events   11/17: Admitted status postcardiac arrest, intubated  Significant Diagnostic Tests:  11/17 chest x-ray: Bilateral  interstitial opacities consistent with pulmonary edema  Interim history/subjective:  Overnight patient was noted to have  myoclonic seizure activity, generalized with Burst suppression with highly epileptiform bursts, generalized and profound diffuse encephalopathy.   most likely due to anoxic/hypoxic brain injury>>  Taken for stat CT Head 11/18 am , no acute intracranial abnormalities, Chronic microvascular ischemic changes in the cerebral white matter redemonstrated Pt. Is not responding to pain, but he is currently on Fentanyl at 75 Hemodynamically stable T max 99.5  Echo 11/17>> EF 50-55%,  There is the  interventricular septum is flattened in systole and diastole, consistent  with right ventricular pressure and volume overload There is moderately elevated  pulmonary artery systolic pressure. The estimated right ventricular  systolic pressure is 52.4 mmHg.  Trivial mitral valve  regurgitation. No evidence of mitral stenosis Mild aortic valve stenosis. Aortic valve mean gradient  measures 7.0 mmHg.  Aortic dilatation noted. There is borderline dilatation of the  ascending aorta, measuring 39 mm.  Net + 174.8 cc's Na 134  Objective   Blood pressure 130/66, pulse 60, temperature 98.8 F (37.1 C), resp. rate (!) 30, height 5' 9.5" (1.765 m), weight 86.1 kg, SpO2 100 %.    Vent Mode: PRVC FiO2 (%):  [60 %-100 %] 60 % Set Rate:  [30 bmp-32 bmp] 30 bmp Vt Set:  [570 mL-620 mL] 620 mL PEEP:  [8 cmH20] 8 cmH20 Plateau Pressure:  [22 cmH20-25 cmH20] 23 cmH20  Intake/Output Summary (Last 24 hours) at 08/12/2021 0859 Last data filed at 08/12/2021 0656 Gross per 24 hour  Intake 424.51 ml  Output 250 ml  Net 174.51 ml   Filed Weights   08-31-2021 0800 08/12/21 0600  Weight: 87.5 kg 86.1 kg    Examination: General: Critically ill-appearing gentleman intubated, and sedated with fentanyl at 75 mcg HEENT: Head atraumatic, nares clear, no scleral icterus or conjunctival injection, pupils  are 2 mm and reactive, ETT is secure and intact, OG tube is secure and intact Cardiac: HR 60, regular rhythm.  No lower extremity edema.  Extremities cool to touch, refill > 3 seconds Pulm: Bilateral chest excursion , crackles per bases GI: Abdomen is soft, ND, NT, BS diminished Neuro: Sedated on fentanyl, pupils are 2 mm and reactive, he does not follow commands, he is not reacting to pain, some myoclonic jerking, no other extremity movement MSK: Normal muscle bulk and tone, no fasciculations or tremor Skin: skin tears on the hands bilaterally, no rash, no lesions   Resolved Hospital Problem list     Assessment & Plan:   Cardiac arrest Multivessel CAD, Hx of NSTEMI s/p PCI x2 to Lcx Acute hypoxic and hypercapnic respiratory failure requiring mechanical ventilation due to above Suspect recurrent MI.  Point-of-care ultrasound shows RV dilation and in no IVC variability with respirations.  He is noted to become hypotensive after increasing fluids which improved after fluid discontinuation.  Chest x-ray significant for pulmonary edema Plan Trend troponins>> continue to climb at last check ( 87) Continue Plavix and aspirin, statin Echocardiogram results noted above Hemodynamically stable now after stopping fluids.  Recommend Levophed if needed for pressure support. 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 Myoclonic jerking noted 11/17/>> 11/18 am EEG with indicators of poor outcome CT Head with no acute intracranial Abnormalities Plan Appreciate Neuro assistance  Continue Fentanyl for comfort Continue Keppra 1 gram twice daily per neuro recs Will continue to monitor for prognostication Ongoing family discussions  AKI in setting of Cardiac  Arrest Creatinine increase to 2.13 Suspect hypoperfusion/ shock as cause Mild hyponatremia Plan Trend BMET Strict I&O Replete electrolytes as needed  Trend Mag and phos Maintain renal perfusion  MAP goal > 65 mm Hg Avoid nephrotoxic medications   Elevated LFT's Again Hypoperfusion related Plan Trend CMET  Element of Heart Failure ? PAH >> RV systolic pressure 52.4 Echo with flattened  interventricular septum  in systole and diastole, consistent  with right ventricular pressure and volume overload CXR on admission with mild interstitial pulmonary edema and pulmonary vascular congestion. Plan Trend BNP CXR in am and prn  Gentle hydration with strict I&O No diuresis at present as sats are adequate If survives, can consider heart failure consult   Glycemic Control Plan CBG Q 4 SSI per glycemic protocol  Nutrition  Plan Will consult Dietitian and initiate TF  Goals of Care Ongoing with family EEG concerning for poor prognosis Plan Per Neuro Want 2 prognostic indicators prior to discussing withdrawal with family  Best practice:  Diet: N.p.o. Pain/Anxiety/Delirium protocol (if indicated): Fentanyl DVT prophylaxis: Heparin GI prophylaxis: Protonix Code Status: Full Family Communication: Family at bedside updated 11/18 Disposition: ICU  Labs   CBC: Recent Labs  Lab 08/31/2021 0815 08-31-21 0859 2021-08-31 1014 Aug 31, 2021 1139 31-Aug-2021 1310 08/12/21 0523  WBC  9.2  --  9.5 12.6*  --  12.9*  NEUTROABS 6.3  --   --   --   --   --   HGB 13.8 14.6 12.7* 12.9* 13.3 13.3  HCT 44.3 43.0 41.5 40.3 39.0 38.9*  MCV 99.6  --  100.7* 95.3  --  90.5  PLT 244  --  213 243  --  231    Basic Metabolic Panel: Recent Labs  Lab 08/22/2021 0815 08/03/2021 0859 08/10/2021 1014 08/01/2021 1139 08/10/2021 1310 08/12/21 0523  NA 134* 132*  --   --  134* 134*  K 3.9 4.0  --   --  3.6 4.3  CL 95*  --   --   --   --  96*  CO2 21*  --   --   --   --  26  GLUCOSE 284*  --   --   --    --  151*  BUN 18  --   --   --   --  35*  CREATININE 1.29*  --  1.12 1.60*  --  2.13*  CALCIUM 9.1  --   --   --   --  8.9  MG  --   --   --   --   --  3.1*  PHOS  --   --   --   --   --  5.2*   GFR: Estimated Creatinine Clearance: 30.5 mL/min (A) (by C-G formula based on SCr of 2.13 mg/dL (H)). Recent Labs  Lab 08/12/2021 0815 07/27/2021 1014 08/01/2021 1139 08/12/21 0523  WBC 9.2 9.5 12.6* 12.9*    Liver Function Tests: Recent Labs  Lab 08/16/2021 0815  AST 207*  ALT 133*  ALKPHOS 111  BILITOT 0.8  PROT 6.7  ALBUMIN 3.1*   No results for input(s): LIPASE, AMYLASE in the last 168 hours. No results for input(s): AMMONIA in the last 168 hours.  ABG    Component Value Date/Time   PHART 7.217 (L) 08/16/2021 1310   PCO2ART 74.0 (HH) 08/01/2021 1310   PO2ART 352 (H) 08/23/2021 1310   HCO3 30.4 (H) 08/18/2021 1310   TCO2 33 (H) 08/05/2021 1310   ACIDBASEDEF 8.0 (H) 08/02/2021 0859   O2SAT 100.0 08/10/2021 1310     Coagulation Profile: No results for input(s): INR, PROTIME in the last 168 hours.  Cardiac Enzymes: Recent Labs  Lab 08/10/2021 0815  CKTOTAL 198    HbA1C: Hgb A1c MFr Bld  Date/Time Value Ref Range Status  12/30/2020 10:35 AM 6.1 (H) 4.8 - 5.6 % Final    Comment:    (NOTE) Pre diabetes:          5.7%-6.4%  Diabetes:              >6.4%  Glycemic control for   <7.0% adults with diabetes     CBG: Recent Labs  Lab 08/05/2021 0933  GLUCAP 254*     Critical Care Time : 45 minutes  Bevelyn Ngo, MSN, AGACNP-BC [facility] See Amion for personal pager PCCM on call pager [phone]  08/12/2021 10:01 AM

## 2021-08-12 NOTE — Progress Notes (Signed)
Initial Nutrition Assessment  DOCUMENTATION CODES:   Not applicable  INTERVENTION:   Tube Feeding via OG:  Vital 1.5 at 60 ml/hr Pro-Source TF 45 mL TID Provides 130 g of protein, 2280 kcals, and 1094 mL of free water   NUTRITION DIAGNOSIS:   Inadequate oral intake related to acute illness as evidenced by NPO status.   GOAL:   Patient will meet greater than or equal to 90% of their needs  MONITOR:   TF tolerance, Vent status, Labs, Weight trends  REASON FOR ASSESSMENT:   Ventilator, Consult Enteral/tube feeding initiation and management  ASSESSMENT:   75 yo male admitted post cardiac arrest, intubated. PMH includes CAD s/p NSTEMI, HTN, HLD, chronic bronchitis, tobacco abuse  Pt remains on vent support, currently on continuous EEG Anoxic brain injury following cardiac arrest, prognosis poor per MD notes  Family reports UBW around 189 pounds; current wt 86.1 kg (189.8 pounds) Family reports pt eating poorly for the past several weeks; mostly just chicken noodle soup. Prior to this, appetite better and eating well.   Labs: sodium 134 (L), Creatinine 2.13, phosphorus 5.2 Meds: ss novolog, colace   NUTRITION - FOCUSED PHYSICAL EXAM:  Flowsheet Row Most Recent Value  Orbital Region No depletion  Upper Arm Region No depletion  Thoracic and Lumbar Region No depletion  Buccal Region Unable to assess  Temple Region Mild depletion  Clavicle Bone Region Mild depletion  Clavicle and Acromion Bone Region Mild depletion  Scapular Bone Region Mild depletion  Dorsal Hand No depletion  Patellar Region No depletion  Anterior Thigh Region No depletion  Posterior Calf Region No depletion       Diet Order:   Diet Order             Diet NPO time specified  Diet effective now                   EDUCATION NEEDS:   Not appropriate for education at this time  Skin:  Skin Assessment: Reviewed RN Assessment  Last BM:  11/17  Height:   Ht Readings from Last 1  Encounters:  09/10/21 5' 9.5" (1.765 m)    Weight:   Wt Readings from Last 1 Encounters:  08/12/21 86.1 kg    BMI:  Body mass index is 27.63 kg/m.  Estimated Nutritional Needs:   Kcal:  2100-2300 kcals  Protein:  125-140 g  Fluid:  >/= 2 L    Romelle Starcher MS, RDN, LDN, CNSC Registered Dietitian III Clinical Nutrition RD Pager and On-Call Pager Number Located in [place]

## 2021-08-12 NOTE — Procedures (Addendum)
Patient Name: Jacob Ruiz  MRN: [id number]  Epilepsy Attending: Charlsie Quest  Referring Physician/Provider: Dr Cheri Fowler Duration: 08/15/2021 1221 to 08/12/2021 1221   Patient history: 68 old male status post cardiac arrest.  EEG to evaluate for seizure.   Level of alertness: comatose   AEDs during EEG study: Propofol, LEV   Technical aspects: This EEG study was done with scalp electrodes positioned according to the 10-20 International system of electrode placement. Electrical activity was acquired at a sampling rate of 500Hz  and reviewed with a high frequency filter of 70Hz  and a low frequency filter of 1Hz . EEG data were recorded continuously and digitally stored.    Description: EEG showed burst suppression pattern with bursts of high amplitude generalized polyspike and wave lasting 2 to 3 seconds alternating with 5 to 10 seconds of generalized EEG suppression. During bursts, at times patient was noted to have bilateral shoulder jerk consistent with myoclonic seizure which gradually resolved. EEG was not reactive to tactile stimulation. Hyperventilation and photic stimulation were not performed.      ABNORMALITY -Myoclonic seizure, generalized -Burst suppression with highly epileptiform bursts, generalized   IMPRESSION: This study initially showed myoclonic seizures with generalized onset which gradually resolved. Additionally there is profound diffuse encephalopathy.  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   Grissel Tyrell 

## 2021-08-12 NOTE — Progress Notes (Signed)
Pt transported pt to CT on vent. No complications. Pt back in room on vent.

## 2021-08-12 NOTE — Progress Notes (Signed)
EEG maintenance performed.  No skin breakdown observed at electrode sites Fp1, Fp2. 

## 2021-08-13 ENCOUNTER — Inpatient Hospital Stay (HOSPITAL_COMMUNITY): Payer: Medicare Other

## 2021-08-13 DIAGNOSIS — I469 Cardiac arrest, cause unspecified: Secondary | ICD-10-CM | POA: Diagnosis not present

## 2021-08-13 DIAGNOSIS — G931 Anoxic brain damage, not elsewhere classified: Secondary | ICD-10-CM | POA: Diagnosis not present

## 2021-08-13 DIAGNOSIS — R569 Unspecified convulsions: Secondary | ICD-10-CM | POA: Diagnosis not present

## 2021-08-13 LAB — POCT I-STAT 7, (LYTES, BLD GAS, ICA,H+H)
Acid-Base Excess: 2 mmol/L (ref 0.0–2.0)
Bicarbonate: 29.5 mmol/L — ABNORMAL HIGH (ref 20.0–28.0)
Calcium, Ion: 1.16 mmol/L (ref 1.15–1.40)
HCT: 38 % — ABNORMAL LOW (ref 39.0–52.0)
Hemoglobin: 12.9 g/dL — ABNORMAL LOW (ref 13.0–17.0)
O2 Saturation: 86 %
Patient temperature: 97.7
Potassium: 4.1 mmol/L (ref 3.5–5.1)
Sodium: 134 mmol/L — ABNORMAL LOW (ref 135–145)
TCO2: 31 mmol/L (ref 22–32)
pCO2 arterial: 58.3 mmHg — ABNORMAL HIGH (ref 32.0–48.0)
pH, Arterial: 7.309 — ABNORMAL LOW (ref 7.350–7.450)
pO2, Arterial: 56 mmHg — ABNORMAL LOW (ref 83.0–108.0)

## 2021-08-13 LAB — CBC WITH DIFFERENTIAL/PLATELET
Abs Immature Granulocytes: 0.5 10*3/uL — ABNORMAL HIGH (ref 0.00–0.07)
Basophils Absolute: 0 10*3/uL (ref 0.0–0.1)
Basophils Relative: 0 %
Eosinophils Absolute: 0 10*3/uL (ref 0.0–0.5)
Eosinophils Relative: 0 %
HCT: 37.5 % — ABNORMAL LOW (ref 39.0–52.0)
Hemoglobin: 12.3 g/dL — ABNORMAL LOW (ref 13.0–17.0)
Lymphocytes Relative: 10 %
Lymphs Abs: 1.7 10*3/uL (ref 0.7–4.0)
MCH: 30.4 pg (ref 26.0–34.0)
MCHC: 32.8 g/dL (ref 30.0–36.0)
MCV: 92.6 fL (ref 80.0–100.0)
Metamyelocytes Relative: 1 %
Monocytes Absolute: 1.6 10*3/uL — ABNORMAL HIGH (ref 0.1–1.0)
Monocytes Relative: 9 %
Myelocytes: 2 %
Neutro Abs: 13.6 10*3/uL — ABNORMAL HIGH (ref 1.7–7.7)
Neutrophils Relative %: 78 %
Platelets: 254 10*3/uL (ref 150–400)
RBC: 4.05 MIL/uL — ABNORMAL LOW (ref 4.22–5.81)
RDW: 13.2 % (ref 11.5–15.5)
WBC: 17.4 10*3/uL — ABNORMAL HIGH (ref 4.0–10.5)
nRBC: 0 % (ref 0.0–0.2)
nRBC: 0 /100 WBC

## 2021-08-13 LAB — TROPONIN I (HIGH SENSITIVITY): Troponin I (High Sensitivity): 64 ng/L — ABNORMAL HIGH (ref <18)

## 2021-08-13 LAB — GLUCOSE, CAPILLARY
Glucose-Capillary: 113 mg/dL — ABNORMAL HIGH (ref 70–99)
Glucose-Capillary: 133 mg/dL — ABNORMAL HIGH (ref 70–99)
Glucose-Capillary: 135 mg/dL — ABNORMAL HIGH (ref 70–99)
Glucose-Capillary: 143 mg/dL — ABNORMAL HIGH (ref 70–99)
Glucose-Capillary: 148 mg/dL — ABNORMAL HIGH (ref 70–99)
Glucose-Capillary: 156 mg/dL — ABNORMAL HIGH (ref 70–99)
Glucose-Capillary: 204 mg/dL — ABNORMAL HIGH (ref 70–99)

## 2021-08-13 LAB — COMPREHENSIVE METABOLIC PANEL
ALT: 65 U/L — ABNORMAL HIGH (ref 0–44)
AST: 51 U/L — ABNORMAL HIGH (ref 15–41)
Albumin: 2.5 g/dL — ABNORMAL LOW (ref 3.5–5.0)
Alkaline Phosphatase: 104 U/L (ref 38–126)
Anion gap: 13 (ref 5–15)
BUN: 67 mg/dL — ABNORMAL HIGH (ref 8–23)
CO2: 25 mmol/L (ref 22–32)
Calcium: 8.5 mg/dL — ABNORMAL LOW (ref 8.9–10.3)
Chloride: 95 mmol/L — ABNORMAL LOW (ref 98–111)
Creatinine, Ser: 3.05 mg/dL — ABNORMAL HIGH (ref 0.61–1.24)
GFR, Estimated: 21 mL/min — ABNORMAL LOW (ref >60)
Glucose, Bld: 152 mg/dL — ABNORMAL HIGH (ref 70–99)
Potassium: 5.2 mmol/L — ABNORMAL HIGH (ref 3.5–5.1)
Sodium: 133 mmol/L — ABNORMAL LOW (ref 135–145)
Total Bilirubin: 1.4 mg/dL — ABNORMAL HIGH (ref 0.3–1.2)
Total Protein: 5.6 g/dL — ABNORMAL LOW (ref 6.5–8.1)

## 2021-08-13 LAB — BRAIN NATRIURETIC PEPTIDE: B Natriuretic Peptide: 138.2 pg/mL — ABNORMAL HIGH (ref 0.0–100.0)

## 2021-08-13 LAB — PHOSPHORUS
Phosphorus: 5.4 mg/dL — ABNORMAL HIGH (ref 2.5–4.6)
Phosphorus: 6.4 mg/dL — ABNORMAL HIGH (ref 2.5–4.6)

## 2021-08-13 LAB — MRSA NEXT GEN BY PCR, NASAL: MRSA by PCR Next Gen: NOT DETECTED

## 2021-08-13 LAB — MAGNESIUM: Magnesium: 3.3 mg/dL — ABNORMAL HIGH (ref 1.7–2.4)

## 2021-08-13 MED ORDER — ALBUTEROL SULFATE (2.5 MG/3ML) 0.083% IN NEBU
2.5000 mg | INHALATION_SOLUTION | RESPIRATORY_TRACT | Status: DC | PRN
  Administered 2021-08-13: 2.5 mg via RESPIRATORY_TRACT
  Filled 2021-08-13: qty 3

## 2021-08-13 MED ORDER — METHYLPREDNISOLONE SODIUM SUCC 40 MG IJ SOLR
40.0000 mg | Freq: Two times a day (BID) | INTRAMUSCULAR | Status: DC
  Administered 2021-08-13 – 2021-08-14 (×3): 40 mg via INTRAVENOUS
  Filled 2021-08-13 (×3): qty 1

## 2021-08-13 MED ORDER — REVEFENACIN 175 MCG/3ML IN SOLN
175.0000 ug | Freq: Every day | RESPIRATORY_TRACT | Status: DC
  Administered 2021-08-13 – 2021-08-14 (×2): 175 ug via RESPIRATORY_TRACT
  Filled 2021-08-13 (×2): qty 3

## 2021-08-13 MED ORDER — SODIUM CHLORIDE 0.9 % IV SOLN
1.0000 g | INTRAVENOUS | Status: DC
  Administered 2021-08-13 – 2021-08-14 (×2): 1 g via INTRAVENOUS
  Filled 2021-08-13 (×2): qty 10

## 2021-08-13 MED ORDER — ARFORMOTEROL TARTRATE 15 MCG/2ML IN NEBU
15.0000 ug | INHALATION_SOLUTION | Freq: Two times a day (BID) | RESPIRATORY_TRACT | Status: DC
Start: 2021-08-13 — End: 2021-08-14
  Administered 2021-08-13 – 2021-08-14 (×3): 15 ug via RESPIRATORY_TRACT
  Filled 2021-08-13 (×3): qty 2

## 2021-08-13 MED ORDER — FUROSEMIDE 10 MG/ML IJ SOLN
60.0000 mg | Freq: Once | INTRAMUSCULAR | Status: AC
  Administered 2021-08-13: 60 mg via INTRAVENOUS
  Filled 2021-08-13: qty 6

## 2021-08-13 MED ORDER — FENTANYL CITRATE PF 50 MCG/ML IJ SOSY
25.0000 ug | PREFILLED_SYRINGE | INTRAMUSCULAR | Status: DC | PRN
  Administered 2021-08-13: 50 ug via INTRAVENOUS
  Administered 2021-08-13: 100 ug via INTRAVENOUS
  Administered 2021-08-13 (×2): 50 ug via INTRAVENOUS
  Administered 2021-08-14: 100 ug via INTRAVENOUS
  Filled 2021-08-13 (×4): qty 2

## 2021-08-13 MED ORDER — AMIODARONE HCL 200 MG PO TABS
200.0000 mg | ORAL_TABLET | Freq: Two times a day (BID) | ORAL | Status: DC
  Administered 2021-08-13 – 2021-08-14 (×3): 200 mg
  Filled 2021-08-13 (×3): qty 1

## 2021-08-13 MED ORDER — METHYLPREDNISOLONE SODIUM SUCC 40 MG IJ SOLR: 40.0000 mg | Freq: Every day | INTRAMUSCULAR | Status: DC

## 2021-08-13 MED ORDER — FENTANYL CITRATE PF 50 MCG/ML IJ SOSY: 25.0000 ug | PREFILLED_SYRINGE | INTRAMUSCULAR | Status: DC | PRN

## 2021-08-13 NOTE — Progress Notes (Signed)
RN gave 1 mg versed from 2 mg vial this am for agitation and ventilation intolerance. RN saved extra 1 mg from vial which was given before MRI to prevent agitation and ventilation intolerance. RN did not realize Pix[employer] wouldn't recognize the other dose, so it says there is waste even though there is no waste. Pixus discrepancy explained. Environmental health practitioner witnessed these doses given at separate times from same vial.   [employer] RN

## 2021-08-13 NOTE — Plan of Care (Signed)
  Problem: Respiratory: Goal: Ability to maintain a clear airway and adequate ventilation will improve Outcome: Progressing   Problem: Nutrition: Goal: Adequate nutrition will be maintained Outcome: Progressing   

## 2021-08-13 NOTE — Progress Notes (Signed)
Upon shift assessment, I noticed the OG tube had moved from 46cm to 56cm. I clamped the OG tube and paused tube feedings. Doctor Agarwala notified. He reviewed chest x-ray and cleared the OG tube for use. I resumed tube feedings. No significant assessment findings associated with the event. Patient calm and stable.  [employer] RN

## 2021-08-13 NOTE — Progress Notes (Signed)
eLink Physician-Brief Progress Note Patient Name: Jacob Ruiz DOB: Oct 04, 1945 MRN: [id number]   Date of Service  08/13/2021  HPI/Events of Note  Review of abdominal film for OGT placement reveals the OGT tip to be in the proximal stomach, however, the side port is at the level of the GE junction.   eICU Interventions  Plan: Advance OGT 6 cm and repeat abdominal film.     Intervention Category Major Interventions: Other:  Lenell Antu 08/13/2021, 9:44 PM

## 2021-08-13 NOTE — Progress Notes (Signed)
Tracheal aspirate collected and sent to the lab.

## 2021-08-13 NOTE — Progress Notes (Signed)
LTM EEG discontinued - no skin breakdown at unhook.   

## 2021-08-13 NOTE — Progress Notes (Signed)
NAME:  Jacob Ruiz, MRN:  [id number], DOB:  [birth year], LOS: 2 ADMISSION DATE:  08-18-2021, CONSULTATION DATE: 08-18-2021 REFERRING MD: Wallace Cullens, CHIEF COMPLAINT: Cardiac arrest  Brief History   75 year old male with chronic bronchitis, hypertension and CAD status post NSTEMI treated with PCI x2 to Lcx in February 2022 who was brought to the ED via EMS status postcardiac arrest.  PCCM was consulted for ongoing management.  History of present illness   History is obtained from his wife and from chart review.  Patient is intubated and sedated and is unable to participate in history taking. His wife notes that he was in his usual state of health this morning and went out to walk the dog.  He fell on his way back home and called his wife.  He had difficulties standing back up so his wife went and helped him.  On the way back to the house, he had told her that he is having a hard time standing straight but did not complain of chest pain.   Once they had returned to the house, he collapsed.  She was not able to feel a pulse and immediately started CPR and called EMS.  She notes that he has been complaining of worsening dyspnea with even minimal exertion over the past month as well as increased cough with sputum production.  He has been occasionally waking up at night saying that he cannot breathe.  He has been sleeping on 1 pillow which is usual for him.  She denies recent fever, chills, or exertional chest pain. She tells me that he was pretty healthy prior to his NSTEMI in February of this year.  She notes that he has been compliant with his Plavix and aspirin and has not missed any recent doses.  Past Medical History  CAD status post NSTEMI status post DES x2 (February 2022), hypertension, hyperlipidemia, chronic bronchitis, tobacco use disorder  Significant Hospital Events   11/17: Admitted status postcardiac arrest, intubated 11/19: Remains bronchospastic.  Still minimal neurologic  response  Interim history/subjective:  No further visible myoclonus.  Objective   Blood pressure (!) 148/70, pulse 62, temperature 98.4 F (36.9 C), resp. rate (!) 29, height 5' 9.5" (1.765 m), weight 86.1 kg, SpO2 100 %.    Vent Mode: PRVC FiO2 (%):  [40 %-60 %] 60 % Set Rate:  [24 bmp-30 bmp] 24 bmp Vt Set:  [620 mL] 620 mL PEEP:  [8 cmH20] 8 cmH20 Plateau Pressure:  [20 cmH20-33 cmH20] 20 cmH20   Intake/Output Summary (Last 24 hours) at 08/13/2021 1443 Last data filed at 08/13/2021 1400 Gross per 24 hour  Intake 2089.54 ml  Output 530 ml  Net 1559.54 ml    Filed Weights   2021-08-18 0800 08/12/21 0600  Weight: 87.5 kg 86.1 kg    Examination: General: Critically ill-appearing gentleman intubated, off all sedation HEENT: Head atraumatic, nares clear, no scleral icterus or conjunctival injection, pupils are 2 mm and reactive, ETT is secure and intact, OG tube is secure and intact Cardiac: HR 60, regular rhythm.  No lower extremity edema.  Extremities cool to touch, refill > 3 seconds Pulm: Bilateral chest excursion , diffuse wheezing.  Copious secretions GI: Abdomen is soft, ND, NT, BS diminished Neuro: Extensor posturing to pain MSK: Normal muscle bulk and tone, no fasciculations or tremor Skin: skin tears on the hands bilaterally, no rash, no lesions   Ancillary test personally reviewed  ABG shows respiratory acidosis consistent with high dead space ventilation Elevated creatinine  3.0 Persistent leukocytosis 17.4 No pulmonary embolism on CT PE Negative initial CT head Assessment & Plan:   Critically ill due to cardiac arrest Multivessel CAD, Hx of NSTEMI s/p PCI x2 to Lcx Acute hypoxic and hypercapnic respiratory failure requiring mechanical ventilation due to above COPD, tobacco use disorder Possible anoxic Brain Injury in setting of cardiac arrest Myoclonic jerking noted 11/17/>> 11/18 am AKI in setting of Cardiac Arrest Creatinine increase to 2.13 Suspect  hypoperfusion/ shock as cause Mild hyponatremia Shock liver Glycemic Control  Plan:  -Tolerating PSV ventilation -Continue bronchodilators.  Started steroids. -All sedation off -Chest physiotherapy -Trial of diuresis -Follow LFTs, they are improving -MRI today to assist in neuro prognostication  Best practice:  Diet: N.p.o., tube feeds running Pain/Anxiety/Delirium protocol (if indicated): Off all sedation DVT prophylaxis: Heparin GI prophylaxis: Protonix Code Status: Full Family Communication: Family at bedside updated 11/18 Disposition: ICU  Labs   CBC: Recent Labs  Lab September 05, 2021 0815 09/05/2021 0859 05-Sep-2021 1014 September 05, 2021 1139 2021-09-05 1310 08/12/21 0523 08/13/21 0604 08/13/21 0927  WBC 9.2  --  9.5 12.6*  --  12.9* 17.4*  --   NEUTROABS 6.3  --   --   --   --   --  13.6*  --   HGB 13.8   < > 12.7* 12.9* 13.3 13.3 12.3* 12.9*  HCT 44.3   < > 41.5 40.3 39.0 38.9* 37.5* 38.0*  MCV 99.6  --  100.7* 95.3  --  90.5 92.6  --   PLT 244  --  213 243  --  231 254  --    < > = values in this interval not displayed.     Basic Metabolic Panel: Recent Labs  Lab 05-Sep-2021 0815 09/05/2021 0859 2021-09-05 1014 09/05/2021 1139 September 05, 2021 1310 08/12/21 0523 08/12/21 1557 08/13/21 0604 08/13/21 0927  NA 134* 132*  --   --  134* 134*  --  133* 134*  K 3.9 4.0  --   --  3.6 4.3  --  5.2* 4.1  CL 95*  --   --   --   --  96*  --  95*  --   CO2 21*  --   --   --   --  26  --  25  --   GLUCOSE 284*  --   --   --   --  151*  --  152*  --   BUN 18  --   --   --   --  35*  --  67*  --   CREATININE 1.29*  --  1.12 1.60*  --  2.13*  --  3.05*  --   CALCIUM 9.1  --   --   --   --  8.9  --  8.5*  --   MG  --   --   --   --   --  3.1* 3.3* 3.3*  --   PHOS  --   --   --   --   --  5.2* 6.1* 5.4*  --     GFR: Estimated Creatinine Clearance: 21.3 mL/min (A) (by C-G formula based on SCr of 3.05 mg/dL (H)). Recent Labs  Lab September 05, 2021 1014 09-05-2021 1139 08/12/21 0523 08/13/21 0604  WBC  9.5 12.6* 12.9* 17.4*     Liver Function Tests: Recent Labs  Lab 09-05-21 0815 08/13/21 0604  AST 207* 51*  ALT 133* 65*  ALKPHOS 111 104  BILITOT 0.8 1.4*  PROT  6.7 5.6*  ALBUMIN 3.1* 2.5*    No results for input(s): LIPASE, AMYLASE in the last 168 hours. No results for input(s): AMMONIA in the last 168 hours.  ABG    Component Value Date/Time   PHART 7.309 (L) 08/13/2021 0927   PCO2ART 58.3 (H) 08/13/2021 0927   PO2ART 56 (L) 08/13/2021 0927   HCO3 29.5 (H) 08/13/2021 0927   TCO2 31 08/13/2021 0927   ACIDBASEDEF 8.0 (H) 08-20-21 0859   O2SAT 86.0 08/13/2021 0927      Coagulation Profile: No results for input(s): INR, PROTIME in the last 168 hours.  Cardiac Enzymes: Recent Labs  Lab 08-20-21 0815  CKTOTAL 198     HbA1C: Hgb A1c MFr Bld  Date/Time Value Ref Range Status  08/12/2021 10:58 AM 6.3 (H) 4.8 - 5.6 % Final    Comment:    (NOTE) Pre diabetes:          5.7%-6.4%  Diabetes:              >6.4%  Glycemic control for   <7.0% adults with diabetes   12/30/2020 10:35 AM 6.1 (H) 4.8 - 5.6 % Final    Comment:    (NOTE) Pre diabetes:          5.7%-6.4%  Diabetes:              >6.4%  Glycemic control for   <7.0% adults with diabetes     CBG: Recent Labs  Lab 08/12/21 1603 08/12/21 1949 08/13/21 0031 08/13/21 0444 08/13/21 0810  GLUCAP 151* 130* 135* 113* 133*    CRITICAL CARE Performed by: Lynnell Catalan   Total critical care time: 35 minutes  Critical care time was exclusive of separately billable procedures and treating other patients.  Critical care was necessary to treat or prevent imminent or life-threatening deterioration.  Critical care was time spent personally by me on the following activities: development of treatment plan with patient and/or surrogate as well as nursing, discussions with consultants, evaluation of patient's response to treatment, examination of patient, obtaining history from patient or surrogate,  ordering and performing treatments and interventions, ordering and review of laboratory studies, ordering and review of radiographic studies, pulse oximetry, re-evaluation of patient's condition and participation in multidisciplinary rounds.  Lynnell Catalan, MD [facility] ICU Physician [facility] [place]  Pager: [phone] Mobile: [phone] After hours: [phone].

## 2021-08-13 NOTE — Progress Notes (Signed)
Pt transported to MRI and back to 4N 12 on full vent support. No complications noted.

## 2021-08-13 NOTE — Procedures (Addendum)
Patient Name: Jacob Ruiz  MRN: [id number]  Epilepsy Attending: Charlsie Quest  Referring Physician/Provider: Dr Cheri Fowler Duration: 08/12/2021 1221 to 08/13/2021 0725   Patient history: 47 old male status post cardiac arrest.  EEG to evaluate for seizure.   Level of alertness: comatose   AEDs during EEG study: Propofol, LEV   Technical aspects: This EEG study was done with scalp electrodes positioned according to the 10-20 International system of electrode placement. Electrical activity was acquired at a sampling rate of 500Hz  and reviewed with a high frequency filter of 70Hz  and a low frequency filter of 1Hz . EEG data were recorded continuously and digitally stored.    Description: EEG showed burst suppression pattern with bursts of high amplitude generalized polyspike and wave lasting 2 to 3 seconds alternating with 3-6 seconds of generalized EEG suppression. Hyperventilation and photic stimulation were not performed.      ABNORMALITY -Burst suppression with highly epileptiform bursts, generalized   IMPRESSION: This study showed evidence of epileptogenicity with generalized onset as well as profound diffuse encephalopathy.  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   [name] 

## 2021-08-13 NOTE — Progress Notes (Signed)
Subjective: No significant changes, EEG with persistent epileptiform burst, but no associated movements  Exam: Vitals:   08/13/21 1105 08/13/21 1200  BP:  139/63  Pulse: 64 61  Resp: (!) 26 (!) 29  Temp: 98.1 F (36.7 C) 98.1 F (36.7 C)  SpO2: 100% 100%   Gen: In bed, NAD Resp: non-labored breathing, no acute distress Abd: soft, nt  Neuro: MS: Does not open eyes or follow commands. 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  Based on early myoclonus and EEG findings I strongly suspect that he has a poor prognosis.  Given his normal CT findings, I have favored getting an MRI and we are planning on moving forward with that today.  Recommendations: 1) MRI today 2) can stop LTM EEG 3) neurology will continue to follow  This patient is critically ill and at significant risk of neurological worsening, death and care requires constant monitoring of vital signs, hemodynamics,respiratory and cardiac monitoring, neurological assessment, discussion with family, other specialists and medical decision making of high complexity. I spent 35 minutes of neurocritical care time  in the care of  this patient. This was time spent independent of any time provided by nurse practitioner or PA.  Ritta Slot, MD [facility] [phone]  If 7pm- 7am, please page neurology on call as listed in AMION. 08/13/2021  1:10 PM

## 2021-08-14 DIAGNOSIS — I469 Cardiac arrest, cause unspecified: Secondary | ICD-10-CM | POA: Diagnosis not present

## 2021-08-14 DIAGNOSIS — G931 Anoxic brain damage, not elsewhere classified: Secondary | ICD-10-CM | POA: Diagnosis not present

## 2021-08-14 LAB — CBC
HCT: 39.4 % (ref 39.0–52.0)
Hemoglobin: 12.4 g/dL — ABNORMAL LOW (ref 13.0–17.0)
MCH: 29.7 pg (ref 26.0–34.0)
MCHC: 31.5 g/dL (ref 30.0–36.0)
MCV: 94.3 fL (ref 80.0–100.0)
Platelets: 240 10*3/uL (ref 150–400)
RBC: 4.18 MIL/uL — ABNORMAL LOW (ref 4.22–5.81)
RDW: 13.4 % (ref 11.5–15.5)
WBC: 20.1 10*3/uL — ABNORMAL HIGH (ref 4.0–10.5)
nRBC: 0 % (ref 0.0–0.2)

## 2021-08-14 LAB — MAGNESIUM: Magnesium: 3.5 mg/dL — ABNORMAL HIGH (ref 1.7–2.4)

## 2021-08-14 LAB — COMPREHENSIVE METABOLIC PANEL
ALT: 55 U/L — ABNORMAL HIGH (ref 0–44)
AST: 38 U/L (ref 15–41)
Albumin: 2.4 g/dL — ABNORMAL LOW (ref 3.5–5.0)
Alkaline Phosphatase: 117 U/L (ref 38–126)
Anion gap: 9 (ref 5–15)
BUN: 89 mg/dL — ABNORMAL HIGH (ref 8–23)
CO2: 29 mmol/L (ref 22–32)
Calcium: 8.3 mg/dL — ABNORMAL LOW (ref 8.9–10.3)
Chloride: 98 mmol/L (ref 98–111)
Creatinine, Ser: 3.41 mg/dL — ABNORMAL HIGH (ref 0.61–1.24)
GFR, Estimated: 18 mL/min — ABNORMAL LOW (ref >60)
Glucose, Bld: 152 mg/dL — ABNORMAL HIGH (ref 70–99)
Potassium: 4.6 mmol/L (ref 3.5–5.1)
Sodium: 136 mmol/L (ref 135–145)
Total Bilirubin: 0.7 mg/dL (ref 0.3–1.2)
Total Protein: 6 g/dL — ABNORMAL LOW (ref 6.5–8.1)

## 2021-08-14 LAB — PHOSPHORUS: Phosphorus: 5.9 mg/dL — ABNORMAL HIGH (ref 2.5–4.6)

## 2021-08-14 LAB — GLUCOSE, CAPILLARY
Glucose-Capillary: 176 mg/dL — ABNORMAL HIGH (ref 70–99)
Glucose-Capillary: 161 mg/dL — ABNORMAL HIGH (ref 70–99)
Glucose-Capillary: 172 mg/dL — ABNORMAL HIGH (ref 70–99)

## 2021-08-14 MED ORDER — GLYCOPYRROLATE 1 MG PO TABS
1.0000 mg | ORAL_TABLET | ORAL | Status: DC | PRN
  Filled 2021-08-14: qty 1

## 2021-08-14 MED ORDER — POLYVINYL ALCOHOL 1.4 % OP SOLN
1.0000 [drp] | Freq: Four times a day (QID) | OPHTHALMIC | Status: DC | PRN
  Filled 2021-08-14: qty 15

## 2021-08-14 MED ORDER — ACETAMINOPHEN 650 MG RE SUPP: 650.0000 mg | Freq: Four times a day (QID) | RECTAL | Status: DC | PRN

## 2021-08-14 MED ORDER — GLYCOPYRROLATE 0.2 MG/ML IJ SOLN: 0.2000 mg | INTRAMUSCULAR | Status: DC | PRN

## 2021-08-14 MED ORDER — ACETAMINOPHEN 325 MG PO TABS: 650.0000 mg | ORAL_TABLET | Freq: Four times a day (QID) | ORAL | Status: DC | PRN

## 2021-08-14 MED ORDER — GLYCOPYRROLATE 0.2 MG/ML IJ SOLN
0.2000 mg | INTRAMUSCULAR | Status: DC | PRN
  Administered 2021-08-14: 0.2 mg via INTRAVENOUS
  Filled 2021-08-14: qty 1

## 2021-08-14 MED ORDER — DIPHENHYDRAMINE HCL 50 MG/ML IJ SOLN: 25.0000 mg | INTRAMUSCULAR | Status: DC | PRN

## 2021-08-14 MED ORDER — MORPHINE BOLUS VIA INFUSION
5.0000 mg | INTRAVENOUS | Status: DC | PRN
  Filled 2021-08-14: qty 5

## 2021-08-14 MED ORDER — PROPOFOL 1000 MG/100ML IV EMUL
5.0000 ug/kg/min | INTRAVENOUS | Status: DC
  Administered 2021-08-14: 5 ug/kg/min via INTRAVENOUS
  Administered 2021-08-14: 20 ug/kg/min via INTRAVENOUS
  Filled 2021-08-14 (×2): qty 100

## 2021-08-14 MED ORDER — MORPHINE 100MG IN NS 100ML (1MG/ML) PREMIX INFUSION
0.0000 mg/h | INTRAVENOUS | Status: DC
Start: 2021-08-14 — End: 2021-08-14
  Filled 2021-08-14: qty 100

## 2021-08-14 MED ORDER — DEXTROSE 5 % IV SOLN: INTRAVENOUS | Status: DC

## 2021-08-14 MED ORDER — MORPHINE SULFATE (PF) 2 MG/ML IV SOLN
2.0000 mg | INTRAVENOUS | Status: DC | PRN
  Administered 2021-08-14 (×2): 2 mg via INTRAVENOUS
  Filled 2021-08-14 (×2): qty 1

## 2021-08-15 LAB — CULTURE, RESPIRATORY W GRAM STAIN

## 2021-08-17 ENCOUNTER — Other Ambulatory Visit: Payer: Medicare Other

## 2021-08-25 NOTE — Progress Notes (Signed)
eLink Physician-Brief Progress Note Patient Name: Jacob Ruiz DOB: September 12, 1946 MRN: [id number]   Date of Service  08/16/2021  HPI/Events of Note  Anoxic myoclonus and increased RR with stimulation. Responds transiently to Fentanyl IV pushes.   eICU Interventions  Plan: Propofol IV infusion. Titrate to RASS = 0 to -1.     Intervention Category Major Interventions: Delirium, psychosis, severe agitation - evaluation and management  [name] 08/12/2021, 2:27 AM

## 2021-08-25 NOTE — Progress Notes (Signed)
Cardiac arrest due to Acute Diastolic Heart Failure

## 2021-08-25 NOTE — Procedures (Signed)
Extubation Procedure Note  Patient Details:   Name: Jacob Ruiz DOB: 01-10-1946 MRN: [id number]   Airway Documentation:    Vent end date: 08/06/2021 Vent end time: 1356   Pt extubated to RA per comfort care orders.  Guss Bunde 08/22/2021, 1:57 PM

## 2021-08-25 NOTE — Progress Notes (Signed)
Subjective: No significant changes, EEG with persistent epileptiform burst, but no associated movements  Exam: Vitals:   09-02-2021 0800 2021/09/02 0921  BP: (!) 149/67   Pulse: 69   Resp: (!) 25   Temp: 98.4 F (36.9 C)   SpO2: 99% 98%   Gen: In bed, NAD Resp: non-labored breathing, no acute distress Abd: soft, nt  Neuro: MS: Does not open eyes or follow commands. CN: Pupils equal round and reactive, corneals intact, no response to doll's eye, cough intact Motor: No response to noxious stimulation Sensory: As above  MRI brain with extensive anoxic injury  Impression: 75 year old male with anoxic brain injury.  He has a dismal prognosis based on Early myoclonus, malignant EEG, no motor response at day 3, and MRI findings. I discussed with family that he had no chance at recovery based on these findings and his wife is clear that he has indicated that he would not want further support in that case.   Recommendations: 1) Move to comfort care per family wishes.   This patient is critically ill and at significant risk of neurological worsening, death and care requires constant monitoring of vital signs, hemodynamics,respiratory and cardiac monitoring, neurological assessment, discussion with family, other specialists and medical decision making of high complexity. I spent 33 minutes of neurocritical care time  in the care of  this patient. This was time spent independent of any time provided by nurse practitioner or PA.  Ritta Slot, MD [facility] [phone]  If 7pm- 7am, please page neurology on call as listed in AMION. 2021/09/02  9:42 AM

## 2021-08-25 NOTE — Progress Notes (Signed)
NAME:  Jacob Ruiz, MRN:  [id number], DOB:  10/23/1945, LOS: 3 ADMISSION DATE:  08/15/2021, CONSULTATION DATE: 08/01/2021 REFERRING MD: Pearline Cables, CHIEF COMPLAINT: Cardiac arrest  Brief History   75 year old male with chronic bronchitis, hypertension and CAD status post NSTEMI treated with PCI x2 to Lcx in February 2022 who was brought to the ED via EMS status postcardiac arrest.  PCCM was consulted for ongoing management.  History of present illness   History is obtained from his wife and from chart review.  Patient is intubated and sedated and is unable to participate in history taking. His wife notes that he was in his usual state of health this morning and went out to walk the dog.  He fell on his way back home and called his wife.  He had difficulties standing back up so his wife went and helped him.  On the way back to the house, he had told her that he is having a hard time standing straight but did not complain of chest pain.   Once they had returned to the house, he collapsed.  She was not able to feel a pulse and immediately started CPR and called EMS.  She notes that he has been complaining of worsening dyspnea with even minimal exertion over the past month as well as increased cough with sputum production.  He has been occasionally waking up at night saying that he cannot breathe.  He has been sleeping on 1 pillow which is usual for him.  She denies recent fever, chills, or exertional chest pain. She tells me that he was pretty healthy prior to his NSTEMI in February of this year.  She notes that he has been compliant with his Plavix and aspirin and has not missed any recent doses.  Past Medical History  CAD status post NSTEMI status post DES x2 (February 2022), hypertension, hyperlipidemia, chronic bronchitis, tobacco use disorder  Significant Hospital Events   11/17: Admitted status postcardiac arrest, intubated 11/19: Remains bronchospastic.  Still minimal neurologic  response  Interim history/subjective:  MRI shows significant anoxic injury.  Transition to comfort care planned following conversation with neurology  Objective   Blood pressure (!) 149/67, pulse 69, temperature 98.4 F (36.9 C), resp. rate (!) 25, height 5' 9.5" (1.765 m), weight 84.2 kg, SpO2 98 %.    Vent Mode: PRVC FiO2 (%):  [40 %-60 %] 40 % Set Rate:  [24 bmp] 24 bmp Vt Set:  [620 mL] 620 mL PEEP:  [8 cmH20] 8 cmH20 Plateau Pressure:  [17 cmH20-28 cmH20] 21 cmH20   Intake/Output Summary (Last 24 hours) at Aug 17, 2021 1232 Last data filed at 17-Aug-2021 0819 Gross per 24 hour  Intake 1024.99 ml  Output 915 ml  Net 109.99 ml    Filed Weights   08/12/2021 0800 08/12/21 0600 2021-08-17 0414  Weight: 87.5 kg 86.1 kg 84.2 kg    Examination: General: Critically ill-appearing gentleman intubated, off all sedation HEENT: Head atraumatic, nares clear, no scleral icterus or conjunctival injection, pupils are 2 mm and reactive, ETT is secure and intact, OG tube is secure and intact Cardiac: HR 60, regular rhythm.  No lower extremity edema.  Extremities cool to touch, refill > 3 seconds Pulm: Bilateral chest excursion , diffuse wheezing.  Copious secretions GI: Abdomen is soft, ND, NT, BS diminished Neuro: Extensor posturing to pain MSK: Normal muscle bulk and tone, no fasciculations or tremor Skin: skin tears on the hands bilaterally, no rash, no lesions   Ancillary test personally  reviewed  ABG shows respiratory acidosis consistent with high dead space ventilation Elevated creatinine 3.0 Persistent leukocytosis 17.4 No pulmonary embolism on CT PE Negative initial CT head Assessment & Plan:   Critically ill due to cardiac arrest Multivessel CAD, Hx of NSTEMI s/p PCI x2 to Lcx Acute hypoxic and hypercapnic respiratory failure requiring mechanical ventilation due to above COPD, tobacco use disorder Possible anoxic Brain Injury in setting of cardiac arrest Myoclonic jerking noted  11/17/>> 11/18 am AKI in setting of Cardiac Arrest Creatinine increase to 2.13 Suspect hypoperfusion/ shock as cause Mild hyponatremia Shock liver Glycemic Control  Plan:  - transition to comfort care. -Comfort care order set in place.  Best practice:  Diet: N.p.o., tube feeds running Pain/Anxiety/Delirium protocol (if indicated): Off all sedation DVT prophylaxis: Heparin GI prophylaxis: Protonix Code Status: Full Family Communication: Family at bedside updated 11/20 Disposition: ICU  Labs   CBC: Recent Labs  Lab 08/30/2021 0815 08/30/21 0859 08/30/2021 1014 08/30/21 1139 08/30/21 1310 08/12/21 0523 08/13/21 0604 08/13/21 0927 08/13/2021 0602  WBC 9.2  --  9.5 12.6*  --  12.9* 17.4*  --  20.1*  NEUTROABS 6.3  --   --   --   --   --  13.6*  --   --   HGB 13.8   < > 12.7* 12.9* 13.3 13.3 12.3* 12.9* 12.4*  HCT 44.3   < > 41.5 40.3 39.0 38.9* 37.5* 38.0* 39.4  MCV 99.6  --  100.7* 95.3  --  90.5 92.6  --  94.3  PLT 244  --  213 243  --  231 254  --  240   < > = values in this interval not displayed.     Basic Metabolic Panel: Recent Labs  Lab 08-30-21 0815 08/30/2021 0859 2021/08/30 1014 08-30-2021 1139 08/30/21 1310 08/12/21 0523 08/12/21 1557 08/13/21 0604 08/13/21 0927 08/13/21 1815 08/02/2021 0602  NA 134*   < >  --   --  134* 134*  --  133* 134*  --  136  K 3.9   < >  --   --  3.6 4.3  --  5.2* 4.1  --  4.6  CL 95*  --   --   --   --  96*  --  95*  --   --  98  CO2 21*  --   --   --   --  26  --  25  --   --  29  GLUCOSE 284*  --   --   --   --  151*  --  152*  --   --  152*  BUN 18  --   --   --   --  35*  --  67*  --   --  89*  CREATININE 1.29*  --  1.12 1.60*  --  2.13*  --  3.05*  --   --  3.41*  CALCIUM 9.1  --   --   --   --  8.9  --  8.5*  --   --  8.3*  MG  --   --   --   --   --  3.1* 3.3* 3.3*  --   --  3.5*  PHOS  --   --   --   --   --  5.2* 6.1* 5.4*  --  6.4* 5.9*   < > = values in this interval not displayed.    GFR: Estimated Creatinine  Clearance: 19 mL/min (A) (by C-G formula based on SCr of 3.41 mg/dL (H)). Recent Labs  Lab 08/06/2021 1139 08/12/21 0523 08/13/21 0604 2021/08/27 0602  WBC 12.6* 12.9* 17.4* 20.1*     Liver Function Tests: Recent Labs  Lab 08/02/2021 0815 08/13/21 0604 August 27, 2021 0602  AST 207* 51* 38  ALT 133* 65* 55*  ALKPHOS 111 104 117  BILITOT 0.8 1.4* 0.7  PROT 6.7 5.6* 6.0*  ALBUMIN 3.1* 2.5* 2.4*    No results for input(s): LIPASE, AMYLASE in the last 168 hours. No results for input(s): AMMONIA in the last 168 hours.  ABG    Component Value Date/Time   PHART 7.309 (L) 08/13/2021 0927   PCO2ART 58.3 (H) 08/13/2021 0927   PO2ART 56 (L) 08/13/2021 0927   HCO3 29.5 (H) 08/13/2021 0927   TCO2 31 08/13/2021 0927   ACIDBASEDEF 8.0 (H) 08/03/2021 0859   O2SAT 86.0 08/13/2021 0927      Coagulation Profile: No results for input(s): INR, PROTIME in the last 168 hours.  Cardiac Enzymes: Recent Labs  Lab 08/24/2021 0815  CKTOTAL 198     HbA1C: Hgb A1c MFr Bld  Date/Time Value Ref Range Status  08/12/2021 10:58 AM 6.3 (H) 4.8 - 5.6 % Final    Comment:    (NOTE) Pre diabetes:          5.7%-6.4%  Diabetes:              >6.4%  Glycemic control for   <7.0% adults with diabetes   12/30/2020 10:35 AM 6.1 (H) 4.8 - 5.6 % Final    Comment:    (NOTE) Pre diabetes:          5.7%-6.4%  Diabetes:              >6.4%  Glycemic control for   <7.0% adults with diabetes     CBG: Recent Labs  Lab 08/13/21 1951 08/13/21 2356 August 27, 2021 0409 08/27/21 0747 08-27-2021 1120  GLUCAP 204* 156* 176* 161* 172*    ation of patient's condition and participation in multidisciplinary rounds.  Kipp Brood, MD [facility] ICU Physician [place]  Pager: [phone] Mobile: [phone] After hours: [phone].

## 2021-08-25 NOTE — Progress Notes (Signed)
Time of Death 1414. Verified by this RN and Benay Pillow, RN.

## 2021-08-25 NOTE — Discharge Summary (Signed)
DEATH SUMMARY   Patient Details  Name: Jacob Ruiz MRN: [id number] DOB: 1946-07-06  Admission/Discharge Information   Admit Date:  2021-09-01  Date of Death: Date of Death: 2021-09-04  Time of Death: Time of Death: Jan 22, 1413  Length of Stay: 3  Referring Physician: Kathyrn Lass, MD   Reason(s) for Hospitalization  Cardiac arrest  Diagnoses  Preliminary cause of death:   Anoxic brain injury Secondary Diagnoses (including complications and co-morbidities):  Principal Problem:   Cardiac arrest [facility]) Coronary artery disease Hypertension Dyslipidemia COPD Myoclonic status Shock liver AKI  Brief Hospital Course (including significant findings, care, treatment, and services provided and events leading to death)  Jacob Ruiz is a 75 y.o. year old male who collapsed at home after briefly feeling unwell.  CPR was started immediately.  History of coronary artery disease with prior stenting February 2002.  Had been experiencing increasing shortness of breath over the last couple weeks with new onset orthopnea.  Early on in his course he developed episodes of myoclonus.  Remains comatose off sedation and MRI showed significant anoxic injury.  Neurology discussed these findings with the patient's family who opted to withdraw care.  Pertinent Labs and Studies  Significant Diagnostic Studies DG Chest 2 View  Result Date: 07/19/2021 CLINICAL DATA:  Chest pain. EXAM: CHEST - 2 VIEW COMPARISON:  June 05, 2021 FINDINGS: The lungs are hyperinflated. Mild, stable, diffuse, chronic appearing increased interstitial lung markings are noted. There is no evidence of acute infiltrate, pleural effusion or pneumothorax. The heart size and mediastinal contours are within normal limits. There is moderate severity calcification of the thoracic aorta. The visualized skeletal structures are unremarkable. IMPRESSION: 1. No acute or active cardiopulmonary disease. Electronically Signed   By: Virgina Norfolk M.D.   On: 07/19/2021 02:03   CT HEAD WO CONTRAST (5MM)  Result Date: 08/12/2021 CLINICAL DATA:  75 year old male with history of potential anoxic brain damage. EXAM: CT HEAD WITHOUT CONTRAST TECHNIQUE: Contiguous axial images were obtained from the base of the skull through the vertex without intravenous contrast. COMPARISON:  Head CT 09-01-21. FINDINGS: Brain: Patchy and confluent areas of decreased attenuation are noted throughout the deep and periventricular white matter of the cerebral hemispheres bilaterally, compatible with chronic microvascular ischemic disease. No evidence of acute infarction, hemorrhage, hydrocephalus, extra-axial collection or mass lesion/mass effect. Vascular: Atherosclerotic calcifications in the vertebrobasilar system and carotid arteries bilaterally. Skull: Normal. Negative for fracture or focal lesion. Sinuses/Orbits: No acute finding. Other: None. IMPRESSION: 1. No acute intracranial abnormalities. 2. Chronic microvascular ischemic changes in the cerebral white matter redemonstrated, as above. Electronically Signed   By: Vinnie Langton M.D.   On: 08/12/2021 08:22   CT HEAD WO CONTRAST (5MM)  Result Date: 2021-09-01 CLINICAL DATA:  Neuro deficit, acute, stroke suspected. Cardiac arrest. EXAM: CT HEAD WITHOUT CONTRAST TECHNIQUE: Contiguous axial images were obtained from the base of the skull through the vertex without intravenous contrast. COMPARISON:  Head MRI and CT 12/29/2020 FINDINGS: Brain: There is no evidence of an acute infarct, intracranial hemorrhage, mass, midline shift, or extra-axial fluid collection. Hypodensities in the cerebral white matter bilaterally are unchanged and nonspecific but compatible with mild chronic small vessel ischemic disease. The ventricles and sulci are within normal limits for age. Vascular: Calcified atherosclerosis at the skull base. No hyperdense vessel. Skull: No fracture or suspicious osseous lesion. Sinuses/Orbits: Mild  mucosal thickening in the paranasal sinuses. Clear mastoid air cells. Possible old medial left orbital fracture. Other: None. IMPRESSION: 1. No  evidence of acute intracranial abnormality. 2. Mild chronic small vessel ischemic disease. Electronically Signed   By: Logan Bores M.D.   On: 08/21/2021 15:58   CT Angio Chest Pulmonary Embolism (PE) W or WO Contrast  Result Date: 08/01/2021 CLINICAL DATA:  PE suspected, high prob.  Cardiac arrest. EXAM: CT ANGIOGRAPHY CHEST WITH CONTRAST TECHNIQUE: Multidetector CT imaging of the chest was performed using the standard protocol during bolus administration of intravenous contrast. Multiplanar CT image reconstructions and MIPs were obtained to evaluate the vascular anatomy. CONTRAST:  158mL OMNIPAQUE IOHEXOL 350 MG/ML SOLN COMPARISON:  CTA chest 11/07/2020 FINDINGS: Cardiovascular: Pulmonary arterial opacification is adequate without evidence of emboli. There is thoracic aortic atherosclerosis without aneurysm. The heart is normal in size. Three-vessel coronary atherosclerosis is noted. There is no pericardial effusion. Mediastinum/Nodes: No enlarged axillary or mediastinal lymph nodes. Borderline enlarged bilateral hilar lymph nodes measuring up to 1 cm in short axis, likely reactive. Endotracheal tube terminating proximally 3 cm above the carina. Lungs/Pleura: No pleural effusion or pneumothorax. Limited assessment of the lung parenchyma due to respiratory motion artifact. Patchy and clustered nodular opacities scattered throughout both lungs. Bronchial wall thickening. Upper Abdomen: Enteric tube terminating in the stomach. Diffuse fatty replacement of the pancreas with scattered calcifications which may reflect the sequelae of chronic pancreatitis. Musculoskeletal: Suspected nondisplaced right anterior third through seventh rib fractures and left anterior/anterolateral second through seventh rib fractures with assessment limited by motion artifact. Additional  nonacute fractures with callus formation involving the right lateral eighth and left anterior fourth ribs. Acute, minimally displaced fracture of the sternal body with overlying hematoma. Mild T11 superior endplate compression fracture, new from the prior CTA and with sclerosis along the endplate, potentially subacute. Unchanged mild chronic T7 compression fracture and previously augmented L1 compression fracture. Review of the MIP images confirms the above findings. IMPRESSION: 1. No evidence of pulmonary emboli. 2. Bronchial wall thickening and patchy and clustered nodular opacities throughout both lungs, likely infectious or inflammatory. 3. Acute fractures of the sternum and bilateral ribs likely related to CPR. 4. Mild T11 compression fracture, new from 10/2020 and possibly subacute. 5. Aortic Atherosclerosis (ICD10-I70.0). Electronically Signed   By: Logan Bores M.D.   On: 08/16/2021 15:55   MR BRAIN WO CONTRAST  Result Date: 08/13/2021 CLINICAL DATA:  Anoxic brain injury EXAM: MRI HEAD WITHOUT CONTRAST TECHNIQUE: Multiplanar, multiecho pulse sequences of the brain and surrounding structures were obtained without intravenous contrast. COMPARISON:  CT head 08/12/2021, brain MRI 12/29/2020 FINDINGS: Brain: There is diffuse diffusion restriction involving the cortex bilaterally as well as the bilateral cerebellar folia with associated mild T2/FLAIR hyperintensity. Small focal areas of diffusion restriction are also seen in the bilateral basal ganglia. Findings are consistent with anoxic injury. There is no evidence of acute intracranial hemorrhage. There is unchanged mild parenchymal volume loss and chronic white matter microangiopathic change. The ventricles are not enlarged. There is no midline shift. There is no mass lesion. Vascular: Normal flow voids. Skull and upper cervical spine: Normal marrow signal. Sinuses/Orbits: There is mild mucosal thickening in the paranasal sinuses. The globes and orbits  are unremarkable. Other: Enteric tube is seen coiled in the oropharynx. Fluid in the imaged airway and bilateral mastoid air cells is likely related to instrumentation. IMPRESSION: Findings above concerning for anoxic brain injury. These results were called by telephone at the time of interpretation on 08/13/2021 at 5:01 pm to provider [facility] , who verbally acknowledged these results. Electronically Signed   By: Collier Salina  Noone M.D.   On: 08/13/2021 17:03   DG Pelvis Portable  Result Date: 08/13/2021 CLINICAL DATA:  Cardiac arrest EXAM: PORTABLE PELVIS 1-2 VIEWS COMPARISON:  None. FINDINGS: Single frontal view of the pelvis demonstrates no acute displaced fractures. Symmetrical bilateral hip osteoarthritis. Foley catheter overlies the lower pelvis. Retained contrast within the kidneys consistent with renal insufficiency. IMPRESSION: 1. Unremarkable pelvis. Electronically Signed   By: Randa Ngo M.D.   On: 08/13/2021 17:56   DG CHEST PORT 1 VIEW  Result Date: 08/13/2021 CLINICAL DATA:  Respiratory failure EXAM: PORTABLE CHEST 1 VIEW COMPARISON:  08/24/2021 FINDINGS: 2 frontal views of the chest demonstrate endotracheal tube overlying tracheal air column tip well above carina. Enteric catheter passes below diaphragm tip excluded by collimation. Cardiac silhouette is stable. Continued central vascular congestion and mild diffuse interstitial prominence. No acute airspace disease, effusion, or pneumothorax. No acute bony abnormalities. IMPRESSION: 1. Stable support devices. 2. Stable central vascular congestion without overt edema. Electronically Signed   By: Randa Ngo M.D.   On: 08/13/2021 17:53   DG Chest Portable 1 View  Result Date: 08/12/2021 CLINICAL DATA:  Post Arrest EXAM: PORTABLE CHEST 1 VIEW COMPARISON:  August 11, 2021. FINDINGS: Endotracheal tube tip at the level of the clavicular heads. Gastric tube courses below the diaphragm; however, the side port is above the GE  junction. Mild diffuse interstitial prominence, likely mild interstitial edema. No visible pleural effusions or pneumothorax on this supine radiograph. Pulmonary vascular congestion with widened vascular pedicle. Cardiac silhouette is otherwise within normal limits. IMPRESSION: 1. Probable mild interstitial pulmonary edema and pulmonary vascular congestion. 2. Endotracheal tube tip at the level of the clavicular heads. 3. Gastric tube courses below the diaphragm; however, the side port is above the GE junction. If intragastric side port positioning is desired, recommend advancement Electronically Signed   By: Margaretha Sheffield M.D.   On: 08/05/2021 08:34   DG Chest Portable 1 View  Result Date: 08/13/2021 CLINICAL DATA:  Post Arrest EXAM: PORTABLE CHEST 1 VIEW COMPARISON:  October 25 FINDINGS: Endotracheal tube terminates in the midthoracic trachea approximately 4.5 cm above the level of the carina. There is an enteric tube which courses below the level of the diaphragm with the distal tip not visualized. The heart is normal in size. No pericardial effusion, although the left costophrenic angle is excluded from the field of view. The no pneumothorax. There is increased perihilar vascular prominence and interstitial opacities. No lobar consolidation. No displaced rib fractures appreciated. IMPRESSION: Support lines and tubes as described. The endotracheal tube terminates approximately 4.5 cm above the level of the carina. Increased vascular prominence and interstitial opacities compatible with vascular congestion/edema. Electronically Signed   By: Albin Felling M.D.   On: 08/21/2021 08:25   DG Abd Portable 1V  Result Date: 08/13/2021 CLINICAL DATA:  Check gastric catheter placement EXAM: PORTABLE ABDOMEN - 1 VIEW COMPARISON:  None. FINDINGS: Gastric catheter is noted extending into the proximal duodenum. Changes of prior vertebral augmentation are seen. IMPRESSION: Gastric catheter now extends into the  proximal duodenum. Electronically Signed   By: Inez Catalina M.D.   On: 08/13/2021 23:44   DG Abd Portable 1V  Result Date: 08/13/2021 CLINICAL DATA:  Enteric catheter placement EXAM: PORTABLE ABDOMEN - 1 VIEW COMPARISON:  08/13/2021 FINDINGS: Frontal view of the lower chest and upper abdomen demonstrates enteric catheter tip projecting over the gastric fundus, tip again noted in the region the gastroesophageal junction. Bowel gas pattern is unchanged. Continued retained contrast  within the kidneys compatible with renal insufficiency. IMPRESSION: 1. No change in enteric catheter position, tip projecting over gastric fundus. Electronically Signed   By: Randa Ngo M.D.   On: 08/13/2021 21:26   DG Abd Portable 1V  Result Date: 08/13/2021 CLINICAL DATA:  Cardiac arrest EXAM: PORTABLE ABDOMEN - 1 VIEW COMPARISON:  01/28/2004 FINDINGS: Supine frontal view of the abdomen and pelvis excludes the pubic symphysis and bilateral flanks by collimation. Enteric catheter tip projects over the gastric fundus, side port projecting over the gastroesophageal junction. Bowel gas pattern is unremarkable without obstruction or ileus. There is retained contrast within the bilateral kidneys, consistent with renal insufficiency. Prior vertebral augmentation at the thoracolumbar junction. No acute bony abnormalities. IMPRESSION: 1. Retained contrast within the bilateral kidneys consistent with renal insufficiency. 2. Unremarkable bowel gas pattern. Electronically Signed   By: Randa Ngo M.D.   On: 08/13/2021 17:54   EEG adult  Result Date: 07/30/2021 Lora Havens, MD     08/12/2021 12:44 PM Patient Name: Jacob Ruiz MRN: [id number] Epilepsy Attending: Lora Havens Referring Physician/Provider: Dr Jacky Kindle Date: 08/03/2021 Duration: 22.42 mins Patient history: 36 old male status post cardiac arrest.  EEG to evaluate for seizure. Level of alertness: comatose AEDs during EEG study: Propofol Technical  aspects: This EEG study was done with scalp electrodes positioned according to the 10-20 International system of electrode placement. Electrical activity was acquired at a sampling rate of 500Hz  and reviewed with a high frequency filter of 70Hz  and a low frequency filter of 1Hz . EEG data were recorded continuously and digitally stored. Description: EEG showed burst suppression pattern with bursts of high amplitude generalized polyspike and wave lasting 2 to 3 seconds alternating with 5 to 15 seconds of generalized EEG suppression.  During burst patient was also noted to have bilateral shoulder jerk consistent with myoclonic seizure. EEG was not reactive to tactile stimulation. Hyperventilation and photic stimulation were not performed.   ABNORMALITY -Myoclonic seizure, generalized -Burst suppression with highly epileptiform bursts, generalized IMPRESSION: This study showed myoclonic seizures every 5 to 15 seconds with generalized onset.  Additionally there is profound diffuse encephalopathy.  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 Dr Tacy Learn was notified. Priyanka Barbra Sarks   Overnight EEG with video  Result Date: 08/12/2021 Lora Havens, MD     08/13/2021  8:44 AM Patient Name: Jacob Ruiz MRN: [id number] Epilepsy Attending: Lora Havens Referring Physician/Provider: Dr Jacky Kindle Duration: 08/12/2021 1221 to 08/12/2021 1221  Patient history: 28 old male status post cardiac arrest.  EEG to evaluate for seizure.  Level of alertness: comatose  AEDs during EEG study: Propofol, LEV  Technical aspects: This EEG study was done with scalp electrodes positioned according to the 10-20 International system of electrode placement. Electrical activity was acquired at a sampling rate of 500Hz  and reviewed with a high frequency filter of 70Hz  and a low frequency filter of 1Hz . EEG data were recorded continuously and digitally stored.  Description: EEG showed burst  suppression pattern with bursts of high amplitude generalized polyspike and wave lasting 2 to 3 seconds alternating with 5 to 10 seconds of generalized EEG suppression. During bursts, at times patient was noted to have bilateral shoulder jerk consistent with myoclonic seizure which gradually resolved. EEG was not reactive to tactile stimulation. Hyperventilation and photic stimulation were not performed.    ABNORMALITY -Myoclonic seizure, generalized -Burst suppression with highly epileptiform bursts, generalized  IMPRESSION: This  study initially showed myoclonic seizures with generalized onset which gradually resolved. Additionally there is profound diffuse encephalopathy.  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  Lora Havens   ECHOCARDIOGRAM COMPLETE  Result Date: 08/13/2021    ECHOCARDIOGRAM REPORT   Patient Name:   Jacob Ruiz Date of Exam: 08/19/2021 Medical Rec #:  [id number]       Height:       69.5 in Accession #:    [id number]      Weight:       192.9 lb Date of Birth:  Jan 23, 1946       BSA:          2.045 m Patient Age:    70 years        BP:           109/63 mmHg Patient Gender: M               HR:           64 bpm. Exam Location:  Inpatient Procedure: 2D Echo, Cardiac Doppler, Color Doppler and Intracardiac            Opacification Agent Indications:    Cardiac Arrest  History:        Patient has no prior history of Echocardiogram examinations,                 most recent 12/31/2020. Risk Factors:Family History of Coronary                 Artery Disease.  Sonographer:    Helmut Muster Referring Phys: [id number] CHAND  Sonographer Comments: Technically difficult study due to poor echo windows, suboptimal apical window and suboptimal subcostal window. Condition. IMPRESSIONS  1. Left ventricular ejection fraction, by estimation, is 50 to 55%. The left ventricle has low normal function. The left ventricle has no regional wall motion abnormalities.  Left ventricular diastolic parameters are indeterminate. There is the interventricular septum is flattened in systole and diastole, consistent with right ventricular pressure and volume overload.  2. Right ventricular systolic function was not well visualized. The right ventricular size is not well visualized. There is moderately elevated pulmonary artery systolic pressure. The estimated right ventricular systolic pressure is 123456 mmHg.  3. The mitral valve is grossly normal. Trivial mitral valve regurgitation. No evidence of mitral stenosis.  4. The aortic valve is grossly normal. Aortic valve regurgitation is not visualized. Mild aortic valve stenosis. Aortic valve mean gradient measures 7.0 mmHg.  5. Aortic dilatation noted. There is borderline dilatation of the ascending aorta, measuring 39 mm. Conclusion(s)/Recommendation(s): Challenging images, even with echo contrast. Grossly normal LV function and wall motion. There appears to be septal flattening concerning for RV pressure/volume overload. Estimated RVSP elevated but size/function of RV not well seen. FINDINGS  Left Ventricle: Left ventricular ejection fraction, by estimation, is 50 to 55%. The left ventricle has low normal function. The left ventricle has no regional wall motion abnormalities. Definity contrast agent was given IV to delineate the left ventricular endocardial borders. The left ventricular internal cavity size was normal in size. There is no left ventricular hypertrophy. The interventricular septum is flattened in systole and diastole, consistent with right ventricular pressure and volume overload. Left ventricular diastolic parameters are indeterminate. Right Ventricle: The right ventricular size is not well visualized. Right vetricular wall thickness was not well visualized. Right ventricular systolic function was not well visualized. There is moderately elevated pulmonary  artery systolic pressure. The  tricuspid regurgitant velocity is  3.33 m/s, and with an assumed right atrial pressure of 8 mmHg, the estimated right ventricular systolic pressure is 123456 mmHg. Left Atrium: Left atrial size was not well visualized. Right Atrium: Right atrial size was not well visualized. Pericardium: There is no evidence of pericardial effusion. Presence of epicardial fat layer. Mitral Valve: The mitral valve is grossly normal. Trivial mitral valve regurgitation. No evidence of mitral valve stenosis. Tricuspid Valve: The tricuspid valve is grossly normal. Tricuspid valve regurgitation is trivial. No evidence of tricuspid stenosis. Aortic Valve: The aortic valve is grossly normal. Aortic valve regurgitation is not visualized. Mild aortic stenosis is present. Aortic valve mean gradient measures 7.0 mmHg. Aortic valve peak gradient measures 16.3 mmHg. Aortic valve area, by VTI measures 1.93 cm. Pulmonic Valve: The pulmonic valve was not well visualized. Pulmonic valve regurgitation is not visualized. Aorta: Aortic dilatation noted. There is borderline dilatation of the ascending aorta, measuring 39 mm. Venous: The inferior vena cava was not well visualized. IAS/Shunts: The interatrial septum was not well visualized.  LEFT VENTRICLE PLAX 2D LVIDd:         4.35 cm      Diastology LVIDs:         3.15 cm      LV e' medial:    8.33 cm/s LV PW:         1.00 cm      LV E/e' medial:  6.3 LV IVS:        1.00 cm      LV e' lateral:   11.10 cm/s LVOT diam:     2.20 cm      LV E/e' lateral: 4.7 LV SV:         60 LV SV Index:   29 LVOT Area:     3.80 cm  LV Volumes (MOD) LV vol d, MOD A2C: 175.0 ml LV vol d, MOD A4C: 177.0 ml LV vol s, MOD A2C: 72.8 ml LV vol s, MOD A4C: 84.2 ml LV SV MOD A2C:     102.2 ml LV SV MOD A4C:     177.0 ml LV SV MOD BP:      98.5 ml RIGHT VENTRICLE RV S prime:     15.70 cm/s LEFT ATRIUM           Index        RIGHT ATRIUM           Index LA diam:      2.20 cm 1.08 cm/m   RA Area:     12.60 cm LA Vol (A4C): 82.0 ml 40.11 ml/m  RA Volume:   30.10 ml   14.72 ml/m  AORTIC VALVE AV Area (Vmax):    1.61 cm AV Area (Vmean):   1.76 cm AV Area (VTI):     1.93 cm AV Vmax:           202.00 cm/s AV Vmean:          122.000 cm/s AV VTI:            0.310 m AV Peak Grad:      16.3 mmHg AV Mean Grad:      7.0 mmHg LVOT Vmax:         85.80 cm/s LVOT Vmean:        56.400 cm/s LVOT VTI:          0.157 m LVOT/AV VTI ratio: 0.51  AORTA Ao Root diam: 3.00  cm Ao Asc diam:  3.90 cm MITRAL VALVE               TRICUSPID VALVE MV Area (PHT): 2.16 cm    TR Peak grad:   44.4 mmHg MV Decel Time: 351 msec    TR Vmax:        333.00 cm/s MV E velocity: 52.60 cm/s MV A velocity: 84.50 cm/s  SHUNTS MV E/A ratio:  0.62        Systemic VTI:  0.16 m                            Systemic Diam: 2.20 cm Jodelle Red MD Electronically signed by Jodelle Red MD Signature Date/Time: 09-01-2021/3:31:08 PM    Final     Microbiology Recent Results (from the past 240 hour(s))  Resp Panel by RT-PCR (Flu A&B, Covid) Nasopharyngeal Swab     Status: None   Collection Time: 01-Sep-2021  8:00 AM   Specimen: Nasopharyngeal Swab; Nasopharyngeal(NP) swabs in vial transport medium  Result Value Ref Range Status   SARS Coronavirus 2 by RT PCR NEGATIVE NEGATIVE Final    Comment: (NOTE) SARS-CoV-2 target nucleic acids are NOT DETECTED.  The SARS-CoV-2 RNA is generally detectable in upper respiratory specimens during the acute phase of infection. The lowest concentration of SARS-CoV-2 viral copies this assay can detect is 138 copies/mL. A negative result does not preclude SARS-Cov-2 infection and should not be used as the sole basis for treatment or other patient management decisions. A negative result may occur with  improper specimen collection/handling, submission of specimen other than nasopharyngeal swab, presence of viral mutation(s) within the areas targeted by this assay, and inadequate number of viral copies(<138 copies/mL). A negative result must be combined with clinical  observations, patient history, and epidemiological information. The expected result is Negative.  Fact Sheet for Patients:  BloggerCourse.com  Fact Sheet for Healthcare Providers:  SeriousBroker.it  This test is no t yet approved or cleared by the [place] FDA and  has been authorized for detection and/or diagnosis of SARS-CoV-2 by FDA under an Emergency Use Authorization (EUA). This EUA will remain  in effect (meaning this test can be used) for the duration of the COVID-19 declaration under Section 564(b)(1) of the Act, 21 U.S.C.section 360bbb-3(b)(1), unless the authorization is terminated  or revoked sooner.       Influenza A by PCR NEGATIVE NEGATIVE Final   Influenza B by PCR NEGATIVE NEGATIVE Final    Comment: (NOTE) The Xpert Xpress SARS-CoV-2/FLU/RSV plus assay is intended as an aid in the diagnosis of influenza from Nasopharyngeal swab specimens and should not be used as a sole basis for treatment. Nasal washings and aspirates are unacceptable for Xpert Xpress SARS-CoV-2/FLU/RSV testing.  Fact Sheet for Patients: BloggerCourse.com  Fact Sheet for Healthcare Providers: SeriousBroker.it  This test is not yet approved or cleared by the [place] FDA and has been authorized for detection and/or diagnosis of SARS-CoV-2 by FDA under an Emergency Use Authorization (EUA). This EUA will remain in effect (meaning this test can be used) for the duration of the COVID-19 declaration under Section 564(b)(1) of the Act, 21 U.S.C. section 360bbb-3(b)(1), unless the authorization is terminated or revoked.  Performed at [facility], [address], [place], [place] [address]   Culture, Respiratory w Gram Stain     Status: None   Collection Time: 08/13/21 11:01 AM   Specimen: Tracheal Aspirate; Respiratory  Result Value Ref Range Status   Specimen Description  TRACHEAL ASPIRATE  Final   Special Requests NONE  Final   Gram Stain   Final    FEW SQUAMOUS EPITHELIAL CELLS PRESENT MODERATE WBC SEEN FEW GRAM POSITIVE COCCI MODERATE GRAM NEGATIVE RODS    Culture   Final    ABUNDANT HAEMOPHILUS INFLUENZAE BETA LACTAMASE POSITIVE Performed at [facility], [place] [address], [place], [place] [address]    Report Status 08/15/2021 FINAL  Final  MRSA Next Gen by PCR, Nasal     Status: None   Collection Time: 08/13/21 11:21 AM   Specimen: Nasal Mucosa; Nasal Swab  Result Value Ref Range Status   MRSA by PCR Next Gen NOT DETECTED NOT DETECTED Final    Comment: (NOTE) The GeneXpert MRSA Assay (FDA approved for NASAL specimens only), is one component of a comprehensive MRSA colonization surveillance program. It is not intended to diagnose MRSA infection nor to guide or monitor treatment for MRSA infections. Test performance is not FDA approved in patients less than 41 years old. Performed at [facility], [place] [address] [place], [place] [address]     Lab Basic Metabolic Panel: Recent Labs  Lab 08/19/2021 0815 08/24/2021 0859 08/01/2021 1014 08/01/2021 1139 08/17/2021 1310 08/12/21 0523 08/12/21 1557 08/13/21 0604 08/13/21 0927 08/13/21 1815 08/25/21 0602  NA 134*   < >  --   --  134* 134*  --  133* 134*  --  136  K 3.9   < >  --   --  3.6 4.3  --  5.2* 4.1  --  4.6  CL 95*  --   --   --   --  96*  --  95*  --   --  98  CO2 21*  --   --   --   --  26  --  25  --   --  29  GLUCOSE 284*  --   --   --   --  151*  --  152*  --   --  152*  BUN 18  --   --   --   --  35*  --  67*  --   --  89*  CREATININE 1.29*  --  1.12 1.60*  --  2.13*  --  3.05*  --   --  3.41*  CALCIUM 9.1  --   --   --   --  8.9  --  8.5*  --   --  8.3*  MG  --   --   --   --   --  3.1* 3.3* 3.3*  --   --  3.5*  PHOS  --   --   --   --   --  5.2* 6.1* 5.4*  --  6.4* 5.9*   < > = values in this interval not displayed.   Liver Function Tests: Recent Labs  Lab  08/13/2021 0815 08/13/21 0604 Aug 25, 2021 0602  AST 207* 51* 38  ALT 133* 65* 55*  ALKPHOS 111 104 117  BILITOT 0.8 1.4* 0.7  PROT 6.7 5.6* 6.0*  ALBUMIN 3.1* 2.5* 2.4*   No results for input(s): LIPASE, AMYLASE in the last 168 hours. No results for input(s): AMMONIA in the last 168 hours. CBC: Recent Labs  Lab 07/26/2021 0815 08/23/2021 0859 07/27/2021 1014 08/16/2021 1139 08/04/2021 1310 08/12/21 0523 08/13/21 0604 08/13/21 0927 08/25/2021 0602  WBC 9.2  --  9.5  12.6*  --  12.9* 17.4*  --  20.1*  NEUTROABS 6.3  --   --   --   --   --  13.6*  --   --   HGB 13.8   < > 12.7* 12.9* 13.3 13.3 12.3* 12.9* 12.4*  HCT 44.3   < > 41.5 40.3 39.0 38.9* 37.5* 38.0* 39.4  MCV 99.6  --  100.7* 95.3  --  90.5 92.6  --  94.3  PLT 244  --  213 243  --  231 254  --  240   < > = values in this interval not displayed.   Cardiac Enzymes: Recent Labs  Lab 08/22/2021 0815  CKTOTAL 198   Sepsis Labs: Recent Labs  Lab 08/13/2021 1139 08/12/21 0523 08/13/21 0604 Aug 29, 2021 0602  WBC 12.6* 12.9* 17.4* 20.1*    Procedures/Operations  Mechanical ventilation, MRI, EEG   Angelissa Supan 08/15/2021, 8:18 PM

## 2021-08-25 DEATH — deceased

## 2022-02-15 IMAGING — CT CT ANGIO CHEST
2 of 7 series · 18 of 46 positions shown · IV contrast (omnipaque)
Comparison: Current chest radiograph.  Chest CT, 01/28/2004

CLINICAL DATA: 74 y/o male from home after experiencing left sided
chest pain with radiation to arm while sitting i[REDACTED]. Pt states
the pain came on while coughing or inhaling.

EXAM:
CT ANGIOGRAPHY CHEST WITH CONTRAST
TECHNIQUE: Multidetector CT imaging of the chest was performed using the
standard protocol during bolus administration of intravenous
contrast. Multiplanar CT image reconstructions and MIPs were
obtained to evaluate the vascular anatomy.
CONTRAST:  70mL OMNIPAQUE IOHEXOL 350 MG/ML SOLN

[Series 6: thins · axial · 0.78mm/px · z∈[+1156,+1449]mm · 15 of 331 slices shown]
[im 19/331  lung]
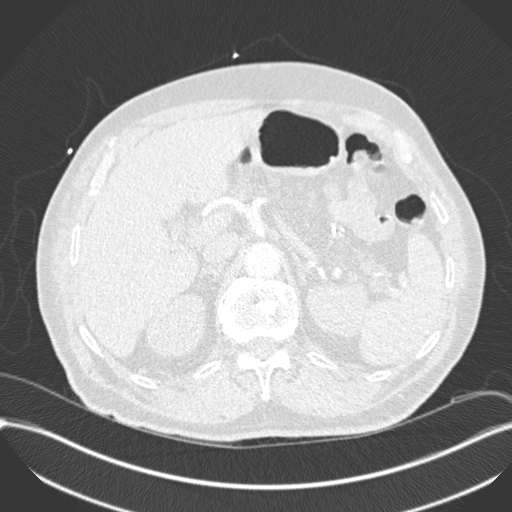
[im 37/331  soft-tissue]
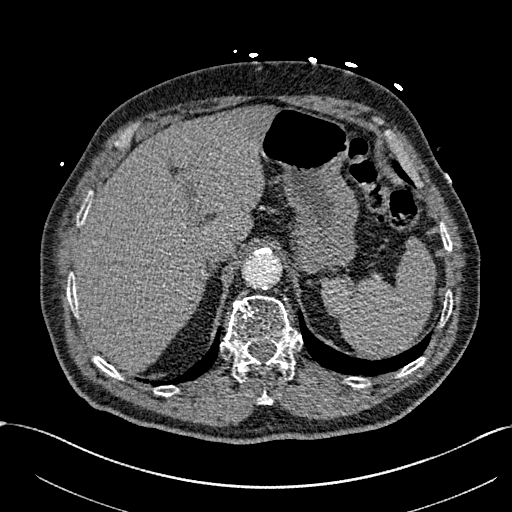
[im 56/331  lung]
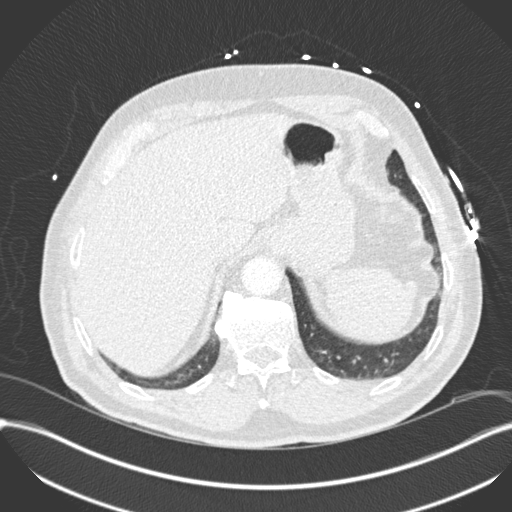
[im 74/331  soft-tissue]
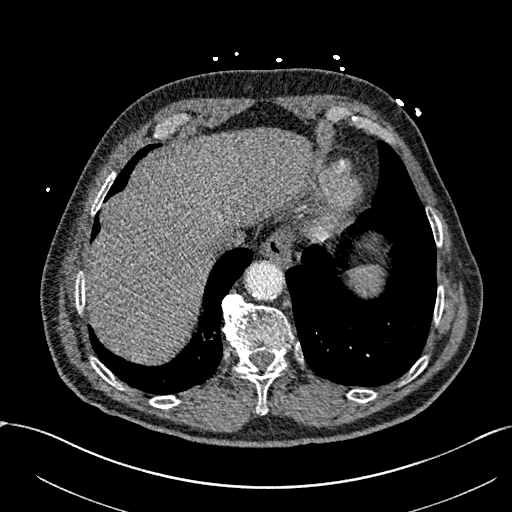
[im 111/331  lung]
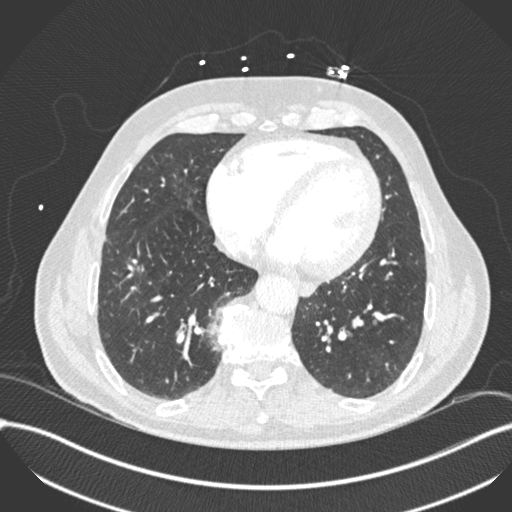
[im 129/331  soft-tissue]
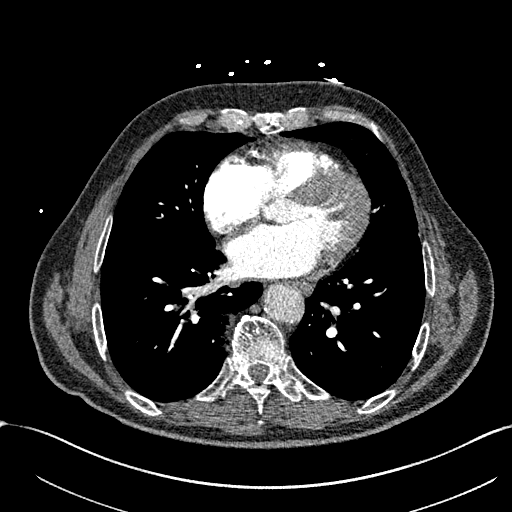
[im 147/331  lung]
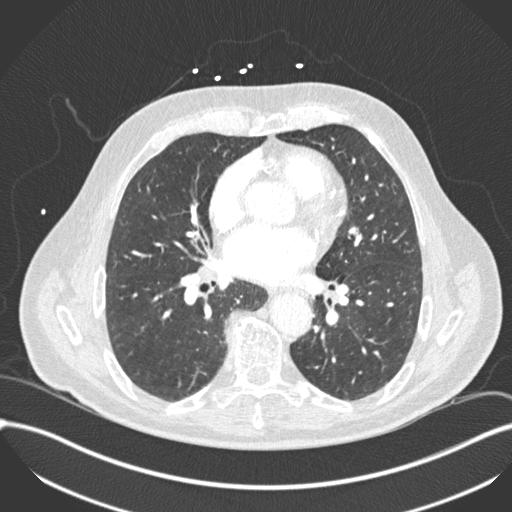
[im 166/331  soft-tissue]
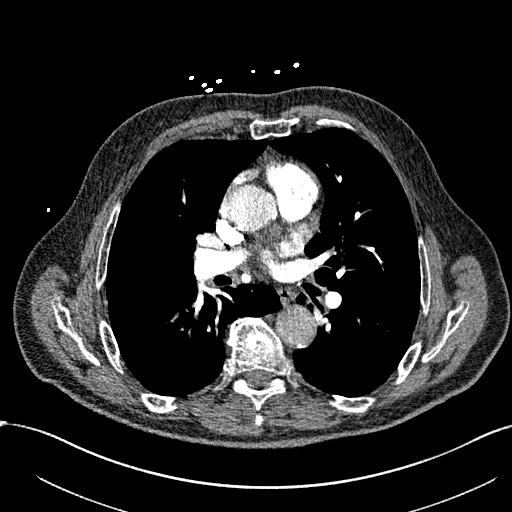
[im 184/331  lung]
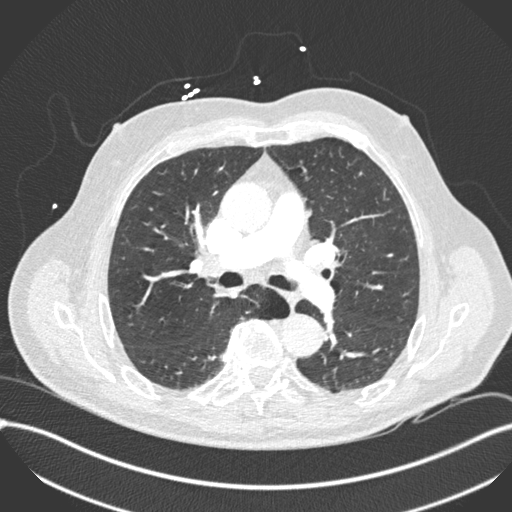
[im 202/331  soft-tissue]
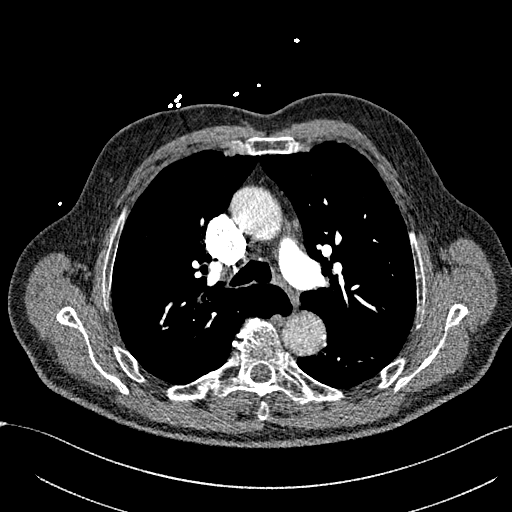
[im 221/331  lung]
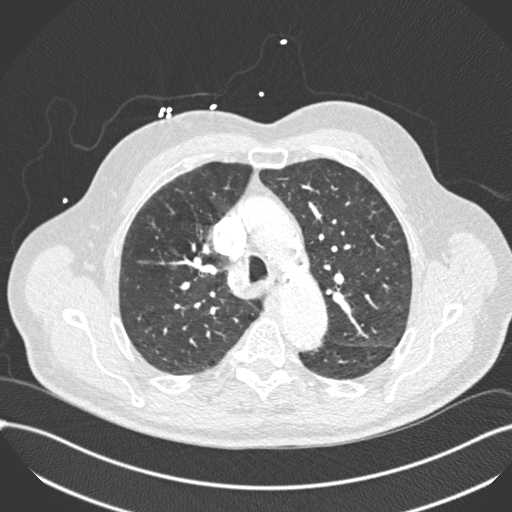
[im 257/331  soft-tissue]
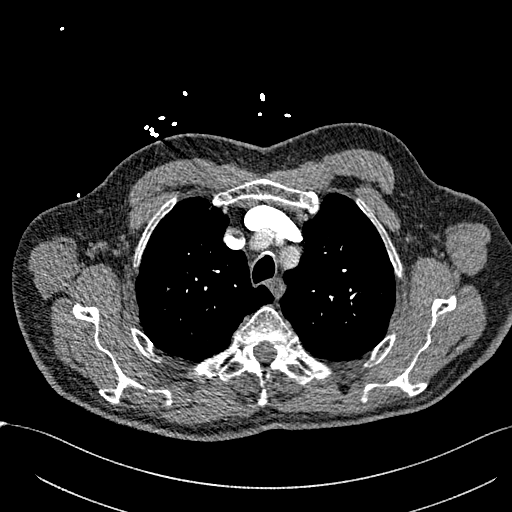
[im 276/331  lung]
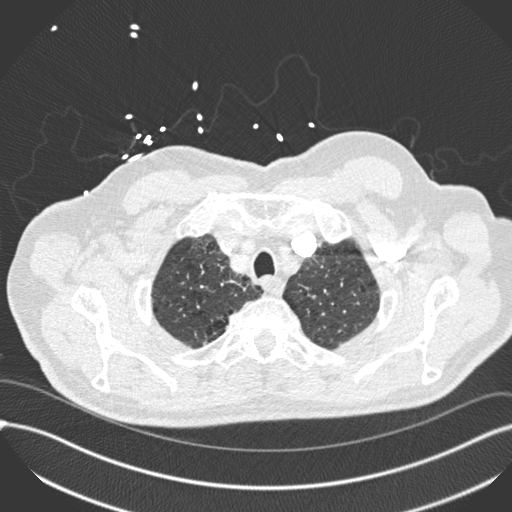
[im 294/331  soft-tissue]
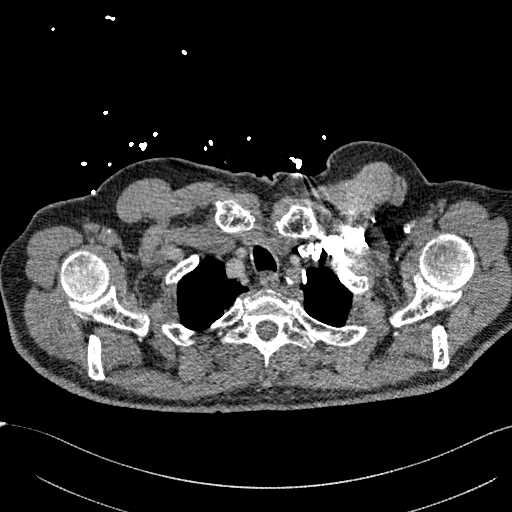
[im 312/331  lung]
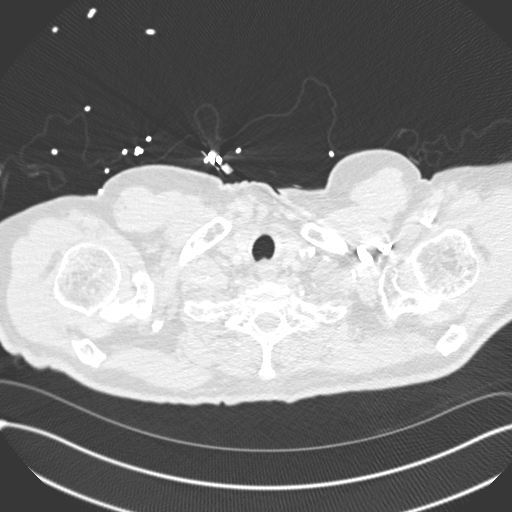

[Series 8: coronal mpr · coronal · 0.65mm/px · 3 of 151 slices shown]
[im 38/151  soft-tissue]
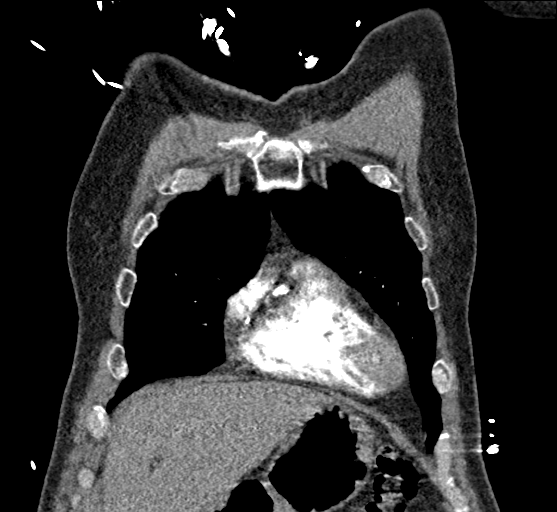
[im 76/151  soft-tissue]
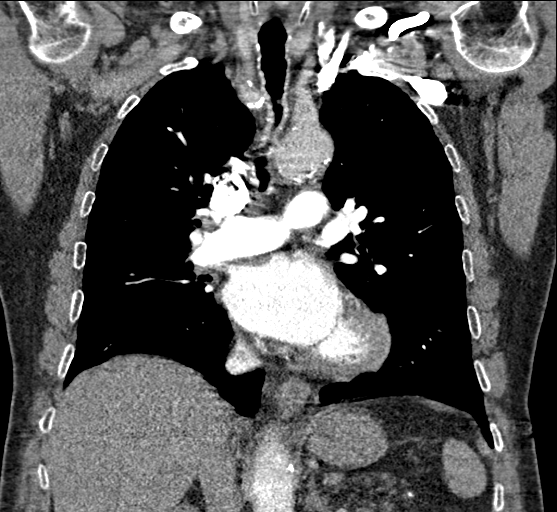
[im 113/151  soft-tissue]
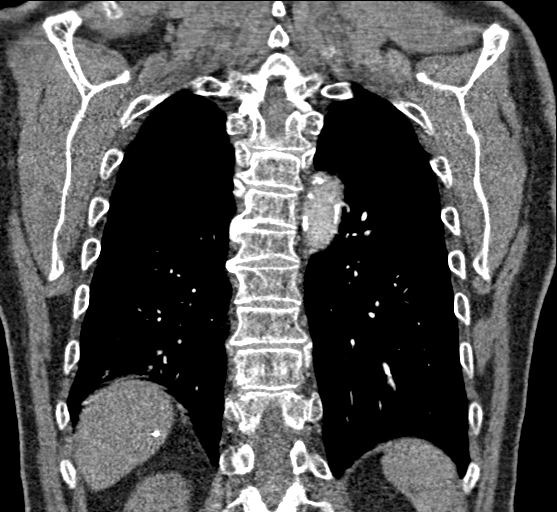

[18 of 46 positions shown; findings below may reference images not displayed]

FINDINGS: Cardiovascular: Pulmonary arteries are well opacified. There is no
evidence of a pulmonary embolism.

Heart is normal in size and configuration. Three-vessel coronary
artery calcifications. No pericardial effusion. Aorta is normal in
caliber. Aortic atherosclerosis. No significant stenosis. No
dissection.

Mediastinum/Nodes: No enlarged mediastinal, hilar, or axillary lymph
nodes. Thyroid gland, trachea, and esophagus demonstrate no
significant findings.

Lungs/Pleura: Minor linear atelectasis at the base of the right
lower lobe. Lungs otherwise clear. No pleural effusion or
pneumothorax. Mild centrilobular paraseptal emphysema in the upper
lobes.

Upper Abdomen: No acute abnormality.

Musculoskeletal: Minor compression deformity of T7, which appears
chronic. No evidence of acute fracture. No bone lesion.

Review of the MIP images confirms the above findings.
IMPRESSION: 1. No evidence of a pulmonary embolism.
2. No acute findings.
3. Minor right lower lobe dependent atelectasis.
4. Emphysema and aortic atherosclerosis well as three-vessel
coronary artery calcifications.

Aortic Atherosclerosis (4SQZC-5V7.7) and Emphysema ([id number]-45O.6).
